# Patient Record
Sex: Female | Born: 1940 | Race: White | Hispanic: No | Marital: Married | Smoking: Former smoker
Health system: Southern US, Community
[De-identification: ages and names within clinical notes are randomized; demographics above are authoritative.]

## PROBLEM LIST (undated history)

## (undated) DIAGNOSIS — E119 Type 2 diabetes mellitus without complications: Secondary | ICD-10-CM

## (undated) DIAGNOSIS — E785 Hyperlipidemia, unspecified: Secondary | ICD-10-CM

## (undated) DIAGNOSIS — Z973 Presence of spectacles and contact lenses: Secondary | ICD-10-CM

## (undated) DIAGNOSIS — L409 Psoriasis, unspecified: Secondary | ICD-10-CM

## (undated) DIAGNOSIS — M1990 Unspecified osteoarthritis, unspecified site: Secondary | ICD-10-CM

## (undated) DIAGNOSIS — I10 Essential (primary) hypertension: Secondary | ICD-10-CM

## (undated) DIAGNOSIS — D869 Sarcoidosis, unspecified: Secondary | ICD-10-CM

## (undated) DIAGNOSIS — C801 Malignant (primary) neoplasm, unspecified: Secondary | ICD-10-CM

## (undated) DIAGNOSIS — G8929 Other chronic pain: Secondary | ICD-10-CM

## (undated) DIAGNOSIS — Z972 Presence of dental prosthetic device (complete) (partial): Secondary | ICD-10-CM

## (undated) DIAGNOSIS — Z8679 Personal history of other diseases of the circulatory system: Secondary | ICD-10-CM

## (undated) DIAGNOSIS — M549 Dorsalgia, unspecified: Secondary | ICD-10-CM

## (undated) HISTORY — DX: Sarcoidosis, unspecified: D86.9

## (undated) HISTORY — PX: ABDOMINAL HYSTERECTOMY: SHX81

## (undated) HISTORY — PX: LIPOMA EXCISION: SHX5283

## (undated) HISTORY — PX: CATARACT EXTRACTION, BILATERAL: SHX1313

## (undated) HISTORY — PX: APPENDECTOMY: SHX54

## (undated) HISTORY — PX: COLONOSCOPY: SHX174

---

## 1996-09-24 DIAGNOSIS — C801 Malignant (primary) neoplasm, unspecified: Secondary | ICD-10-CM

## 1996-09-24 HISTORY — DX: Malignant (primary) neoplasm, unspecified: C80.1

## 1996-09-24 HISTORY — PX: PARTIAL COLECTOMY: SHX5273

## 1998-09-09 ENCOUNTER — Ambulatory Visit (HOSPITAL_COMMUNITY): Admission: RE | Admit: 1998-09-09 | Discharge: 1998-09-09 | Payer: Self-pay | Admitting: Obstetrics and Gynecology

## 1998-09-09 ENCOUNTER — Encounter: Payer: Self-pay | Admitting: Hematology and Oncology

## 1998-12-27 ENCOUNTER — Emergency Department (HOSPITAL_COMMUNITY): Admission: EM | Admit: 1998-12-27 | Discharge: 1998-12-27 | Payer: Self-pay | Admitting: Internal Medicine

## 1998-12-27 ENCOUNTER — Encounter: Payer: Self-pay | Admitting: Internal Medicine

## 1999-02-27 ENCOUNTER — Encounter: Payer: Self-pay | Admitting: Emergency Medicine

## 1999-02-28 ENCOUNTER — Encounter: Payer: Self-pay | Admitting: Emergency Medicine

## 1999-02-28 ENCOUNTER — Encounter: Payer: Self-pay | Admitting: Family Medicine

## 1999-02-28 ENCOUNTER — Inpatient Hospital Stay (HOSPITAL_COMMUNITY): Admission: EM | Admit: 1999-02-28 | Discharge: 1999-03-18 | Payer: Self-pay | Admitting: Emergency Medicine

## 1999-03-01 ENCOUNTER — Encounter: Payer: Self-pay | Admitting: Family Medicine

## 1999-03-08 ENCOUNTER — Encounter: Payer: Self-pay | Admitting: Surgery

## 1999-03-09 ENCOUNTER — Encounter (INDEPENDENT_AMBULATORY_CARE_PROVIDER_SITE_OTHER): Payer: Self-pay | Admitting: Specialist

## 1999-09-29 ENCOUNTER — Ambulatory Visit (HOSPITAL_COMMUNITY): Admission: RE | Admit: 1999-09-29 | Discharge: 1999-09-29 | Payer: Self-pay | Admitting: Hematology and Oncology

## 1999-09-29 ENCOUNTER — Encounter: Payer: Self-pay | Admitting: Hematology and Oncology

## 2000-05-02 ENCOUNTER — Encounter: Admission: RE | Admit: 2000-05-02 | Discharge: 2000-05-02 | Payer: Self-pay | Admitting: Family Medicine

## 2000-05-02 ENCOUNTER — Encounter: Payer: Self-pay | Admitting: Family Medicine

## 2000-09-09 ENCOUNTER — Encounter: Admission: RE | Admit: 2000-09-09 | Discharge: 2000-09-09 | Payer: Self-pay | Admitting: *Deleted

## 2000-09-09 ENCOUNTER — Encounter: Payer: Self-pay | Admitting: *Deleted

## 2000-09-20 ENCOUNTER — Ambulatory Visit (HOSPITAL_COMMUNITY): Admission: RE | Admit: 2000-09-20 | Discharge: 2000-09-20 | Payer: Self-pay | Admitting: Gastroenterology

## 2000-09-20 ENCOUNTER — Encounter (INDEPENDENT_AMBULATORY_CARE_PROVIDER_SITE_OTHER): Payer: Self-pay | Admitting: Specialist

## 2000-09-24 HISTORY — PX: COMBINED MEDIASTINOSCOPY AND BRONCHOSCOPY: SUR303

## 2000-10-01 ENCOUNTER — Ambulatory Visit (HOSPITAL_COMMUNITY): Admission: RE | Admit: 2000-10-01 | Discharge: 2000-10-01 | Payer: Self-pay | Admitting: Family Medicine

## 2000-10-01 ENCOUNTER — Encounter: Payer: Self-pay | Admitting: Family Medicine

## 2000-12-11 ENCOUNTER — Encounter: Admission: RE | Admit: 2000-12-11 | Discharge: 2000-12-11 | Payer: Self-pay | Admitting: *Deleted

## 2000-12-11 ENCOUNTER — Encounter: Payer: Self-pay | Admitting: *Deleted

## 2001-02-21 ENCOUNTER — Encounter: Payer: Self-pay | Admitting: Thoracic Surgery

## 2001-02-24 ENCOUNTER — Encounter (INDEPENDENT_AMBULATORY_CARE_PROVIDER_SITE_OTHER): Payer: Self-pay | Admitting: *Deleted

## 2001-02-24 ENCOUNTER — Ambulatory Visit (HOSPITAL_COMMUNITY): Admission: RE | Admit: 2001-02-24 | Discharge: 2001-02-24 | Payer: Self-pay | Admitting: Thoracic Surgery

## 2001-02-24 ENCOUNTER — Encounter: Payer: Self-pay | Admitting: Thoracic Surgery

## 2001-02-24 ENCOUNTER — Encounter (INDEPENDENT_AMBULATORY_CARE_PROVIDER_SITE_OTHER): Payer: Self-pay | Admitting: Specialist

## 2001-03-14 ENCOUNTER — Ambulatory Visit: Admission: RE | Admit: 2001-03-14 | Discharge: 2001-03-14 | Payer: Self-pay | Admitting: Critical Care Medicine

## 2001-04-23 ENCOUNTER — Encounter: Admission: RE | Admit: 2001-04-23 | Discharge: 2001-04-23 | Payer: Self-pay | Admitting: Thoracic Surgery

## 2001-04-23 ENCOUNTER — Encounter: Payer: Self-pay | Admitting: Thoracic Surgery

## 2001-10-02 ENCOUNTER — Encounter: Payer: Self-pay | Admitting: Family Medicine

## 2001-10-02 ENCOUNTER — Ambulatory Visit (HOSPITAL_COMMUNITY): Admission: RE | Admit: 2001-10-02 | Discharge: 2001-10-02 | Payer: Self-pay | Admitting: Family Medicine

## 2001-12-25 ENCOUNTER — Encounter: Payer: Self-pay | Admitting: *Deleted

## 2001-12-25 ENCOUNTER — Ambulatory Visit (HOSPITAL_COMMUNITY): Admission: RE | Admit: 2001-12-25 | Discharge: 2001-12-25 | Payer: Self-pay | Admitting: *Deleted

## 2002-12-25 ENCOUNTER — Encounter: Payer: Self-pay | Admitting: Family Medicine

## 2002-12-25 ENCOUNTER — Ambulatory Visit (HOSPITAL_COMMUNITY): Admission: RE | Admit: 2002-12-25 | Discharge: 2002-12-25 | Payer: Self-pay | Admitting: Family Medicine

## 2004-03-14 ENCOUNTER — Ambulatory Visit (HOSPITAL_COMMUNITY): Admission: RE | Admit: 2004-03-14 | Discharge: 2004-03-14 | Payer: Self-pay | Admitting: Family Medicine

## 2005-04-10 ENCOUNTER — Ambulatory Visit (HOSPITAL_COMMUNITY): Admission: RE | Admit: 2005-04-10 | Discharge: 2005-04-10 | Payer: Self-pay | Admitting: Family Medicine

## 2006-06-27 ENCOUNTER — Ambulatory Visit (HOSPITAL_COMMUNITY): Admission: RE | Admit: 2006-06-27 | Discharge: 2006-06-27 | Payer: Self-pay | Admitting: Family Medicine

## 2007-07-10 ENCOUNTER — Ambulatory Visit (HOSPITAL_COMMUNITY): Admission: RE | Admit: 2007-07-10 | Discharge: 2007-07-10 | Payer: Self-pay | Admitting: Family Medicine

## 2008-09-06 ENCOUNTER — Ambulatory Visit (HOSPITAL_COMMUNITY): Admission: RE | Admit: 2008-09-06 | Discharge: 2008-09-06 | Payer: Self-pay | Admitting: Family Medicine

## 2009-09-24 HISTORY — PX: DG ARTHRO THUMB*R*: HXRAD209

## 2009-11-07 ENCOUNTER — Ambulatory Visit (HOSPITAL_COMMUNITY): Admission: RE | Admit: 2009-11-07 | Discharge: 2009-11-07 | Payer: Self-pay | Admitting: Family Medicine

## 2010-07-27 ENCOUNTER — Ambulatory Visit (HOSPITAL_BASED_OUTPATIENT_CLINIC_OR_DEPARTMENT_OTHER): Admission: RE | Admit: 2010-07-27 | Discharge: 2010-07-27 | Payer: Self-pay | Admitting: Orthopedic Surgery

## 2010-12-05 LAB — WOUND CULTURE

## 2010-12-05 LAB — GLUCOSE, CAPILLARY
Glucose-Capillary: 145 mg/dL — ABNORMAL HIGH (ref 70–99)
Glucose-Capillary: 168 mg/dL — ABNORMAL HIGH (ref 70–99)

## 2010-12-06 LAB — BASIC METABOLIC PANEL
Creatinine, Ser: 0.79 mg/dL (ref 0.4–1.2)
GFR calc non Af Amer: >60 mL/min (ref >60)
Glucose, Bld: 79 mg/dL (ref 70–99)
Potassium: 5.2 mEq/L — ABNORMAL HIGH (ref 3.5–5.1)

## 2011-02-09 NOTE — Op Note (Signed)
[facility]  Patient:    Beth [name], Beth [name]                      MRN: [id number] Proc. Date: 02/24/01 Adm. Date:  [id number] Attending:  Cameron Proud CC:         Norton Blizzard, M.D. (2)  Oletta Darter, M.D.   Operative Report  PREOPERATIVE DIAGNOSES:  History of colon cancer and mediastinal adenopathy, and small lung nodules.  POSTOPERATIVE DIAGNOSES:  History of colon cancer and mediastinal adenopathy, and small lung nodules.  OPERATION:  Fiberoptic bronchoscopy, mediastinoscopy.  SURGEON:  D. Karle Plumber, M.D.  ANESTHESIA:  General.  DESCRIPTION OF PROCEDURE:  After the percutaneous insertion of all monitoring lines, the patient underwent general anesthesia.  The patient was prepped and draped in the usual sterile manner.  A fiberoptic bronchoscope was passed through the endotracheal tube.  The right upper lobe, right middle lobe, and right lower lobe orifices were normal.  The left upper lobe and left lower lobe orifices were normal.  The anterior neck was prepped and draped in the usual sterile manner.  A transverse incision was made and carried down with electrocautery through the subcutaneous tissue and fascia.  The pretracheal fascia was entered, and biopsies of the 4L and 7 nodes were done.  The mediastinoscope was removed.  The strap muscles were closed with #2-0 Vicryl. The subcutaneous tissue closed with #3-0 Vicryl.  Steri-Strips were applied.  The patient was returned to the recovery room in stable condition.DD:  02/24/01 TD:  02/24/01 Job: [id number]

## 2011-02-09 NOTE — Procedures (Signed)
[facility]  Patient:    ZIARE, ORRICK                        MRN: [id number] Proc. Date: 07/21/00 Attending:  Fayrene Fearing L. Randa Evens, M.D. CC:         Aura Dials, M.D.  Charles A. Tenny Craw, M.D.  Currie Paris, M.D.  [facility]   Procedure Report  PROCEDURE:  Colonoscopy and biopsy.  SURGEON:  James L. Edwards, M.D.  MEDICATIONS:  Fentanyl 100 mcg and Versed 6 mg IV.  INDICATIONS:  Followup of colon cancer.  Done as a two year followup.  She has received 5-FU and leucovorin.  DESCRIPTION OF PROCEDURE:  The procedure had been explained to the patient and consent obtained.  With the patient in the left lateral decubitus position, the pediatric video colonoscope was inserted and advanced under direct visualization.  Prep excellent and were able to advance to the cecum and the ileocecal valve seen.  ________ of what appeared to be the appendix was somewhat mounded up prominent area that could have been a polyp.  I probed it and was never actually sure if the appendiceal orifice was there or if this was truly a polyp.  I therefore elected to biopsy it.  The scope was withdrawn.  The ascending colon, hepatic flexure, transverse colon, splenic flexure, descending and sigmoid colon revealed scattered diverticuli, and otherwise was unremarkable and no further polyps seen.  The scope was withdrawn and the patient tolerated the procedure well.  ASSESSMENT:  Possible cecal polyp versus area from previous appendectomy.  The patients husband thinks that she did have an appendectomy at the time of surgery, but was not absolutely sure.  PLAN:  Will check pathology and make further recommendations then. DD:  09/20/00 TD:  09/21/00 Job: [id number]

## 2011-08-29 ENCOUNTER — Other Ambulatory Visit (HOSPITAL_COMMUNITY): Payer: Self-pay | Admitting: Family Medicine

## 2011-08-29 DIAGNOSIS — Z1231 Encounter for screening mammogram for malignant neoplasm of breast: Secondary | ICD-10-CM

## 2011-10-01 DIAGNOSIS — E119 Type 2 diabetes mellitus without complications: Secondary | ICD-10-CM | POA: Diagnosis not present

## 2011-10-01 DIAGNOSIS — R059 Cough, unspecified: Secondary | ICD-10-CM | POA: Diagnosis not present

## 2011-10-01 DIAGNOSIS — E782 Mixed hyperlipidemia: Secondary | ICD-10-CM | POA: Diagnosis not present

## 2011-10-01 DIAGNOSIS — R05 Cough: Secondary | ICD-10-CM | POA: Diagnosis not present

## 2011-10-01 DIAGNOSIS — I10 Essential (primary) hypertension: Secondary | ICD-10-CM | POA: Diagnosis not present

## 2011-10-03 ENCOUNTER — Ambulatory Visit (HOSPITAL_COMMUNITY)
Admission: RE | Admit: 2011-10-03 | Discharge: 2011-10-03 | Disposition: A | Discharge status: Home / Self Care | Payer: Medicare Other | Source: Ambulatory Visit | Attending: Family Medicine | Admitting: Family Medicine

## 2011-10-03 DIAGNOSIS — Z1231 Encounter for screening mammogram for malignant neoplasm of breast: Secondary | ICD-10-CM

## 2012-08-28 DIAGNOSIS — E119 Type 2 diabetes mellitus without complications: Secondary | ICD-10-CM | POA: Diagnosis not present

## 2012-08-29 ENCOUNTER — Other Ambulatory Visit (HOSPITAL_COMMUNITY): Payer: Self-pay | Admitting: Family Medicine

## 2012-08-29 DIAGNOSIS — Z1231 Encounter for screening mammogram for malignant neoplasm of breast: Secondary | ICD-10-CM

## 2012-09-05 DIAGNOSIS — E119 Type 2 diabetes mellitus without complications: Secondary | ICD-10-CM | POA: Diagnosis not present

## 2012-09-05 DIAGNOSIS — E782 Mixed hyperlipidemia: Secondary | ICD-10-CM | POA: Diagnosis not present

## 2012-09-05 DIAGNOSIS — Z1331 Encounter for screening for depression: Secondary | ICD-10-CM | POA: Diagnosis not present

## 2012-09-05 DIAGNOSIS — L509 Urticaria, unspecified: Secondary | ICD-10-CM | POA: Diagnosis not present

## 2012-09-05 DIAGNOSIS — I10 Essential (primary) hypertension: Secondary | ICD-10-CM | POA: Diagnosis not present

## 2012-09-08 DIAGNOSIS — Z09 Encounter for follow-up examination after completed treatment for conditions other than malignant neoplasm: Secondary | ICD-10-CM | POA: Diagnosis not present

## 2012-09-08 DIAGNOSIS — Z85038 Personal history of other malignant neoplasm of large intestine: Secondary | ICD-10-CM | POA: Diagnosis not present

## 2012-09-08 DIAGNOSIS — K648 Other hemorrhoids: Secondary | ICD-10-CM | POA: Diagnosis not present

## 2012-09-10 DIAGNOSIS — H26499 Other secondary cataract, unspecified eye: Secondary | ICD-10-CM | POA: Diagnosis not present

## 2012-09-22 DIAGNOSIS — Z23 Encounter for immunization: Secondary | ICD-10-CM | POA: Diagnosis not present

## 2012-10-03 ENCOUNTER — Ambulatory Visit (HOSPITAL_COMMUNITY)
Admission: RE | Admit: 2012-10-03 | Discharge: 2012-10-03 | Disposition: A | Discharge status: Home / Self Care | Payer: Medicare Other | Source: Ambulatory Visit | Attending: Family Medicine | Admitting: Family Medicine

## 2012-10-03 DIAGNOSIS — Z1231 Encounter for screening mammogram for malignant neoplasm of breast: Secondary | ICD-10-CM | POA: Insufficient documentation

## 2013-02-06 DIAGNOSIS — L570 Actinic keratosis: Secondary | ICD-10-CM | POA: Diagnosis not present

## 2013-05-22 DIAGNOSIS — S6980XA Other specified injuries of unspecified wrist, hand and finger(s), initial encounter: Secondary | ICD-10-CM | POA: Diagnosis not present

## 2013-05-22 DIAGNOSIS — S6990XA Unspecified injury of unspecified wrist, hand and finger(s), initial encounter: Secondary | ICD-10-CM | POA: Diagnosis not present

## 2013-06-04 DIAGNOSIS — L608 Other nail disorders: Secondary | ICD-10-CM | POA: Diagnosis not present

## 2013-07-09 DIAGNOSIS — Z23 Encounter for immunization: Secondary | ICD-10-CM | POA: Diagnosis not present

## 2013-07-17 DIAGNOSIS — L570 Actinic keratosis: Secondary | ICD-10-CM | POA: Diagnosis not present

## 2013-07-17 DIAGNOSIS — L608 Other nail disorders: Secondary | ICD-10-CM | POA: Diagnosis not present

## 2013-07-20 DIAGNOSIS — S6990XA Unspecified injury of unspecified wrist, hand and finger(s), initial encounter: Secondary | ICD-10-CM | POA: Diagnosis not present

## 2013-07-20 DIAGNOSIS — S6980XA Other specified injuries of unspecified wrist, hand and finger(s), initial encounter: Secondary | ICD-10-CM | POA: Diagnosis not present

## 2013-07-21 ENCOUNTER — Other Ambulatory Visit: Payer: Self-pay | Admitting: Orthopedic Surgery

## 2013-07-21 ENCOUNTER — Encounter (HOSPITAL_BASED_OUTPATIENT_CLINIC_OR_DEPARTMENT_OTHER): Payer: Self-pay | Admitting: *Deleted

## 2013-07-21 ENCOUNTER — Encounter (HOSPITAL_BASED_OUTPATIENT_CLINIC_OR_DEPARTMENT_OTHER)
Admission: RE | Admit: 2013-07-21 | Discharge: 2013-07-21 | Disposition: A | Discharge status: Home / Self Care | Payer: Medicare Other | Source: Ambulatory Visit | Attending: Orthopedic Surgery | Admitting: Orthopedic Surgery

## 2013-07-21 DIAGNOSIS — B351 Tinea unguium: Secondary | ICD-10-CM | POA: Diagnosis not present

## 2013-07-21 DIAGNOSIS — Z87891 Personal history of nicotine dependence: Secondary | ICD-10-CM | POA: Diagnosis not present

## 2013-07-21 DIAGNOSIS — E785 Hyperlipidemia, unspecified: Secondary | ICD-10-CM | POA: Diagnosis not present

## 2013-07-21 DIAGNOSIS — Z79899 Other long term (current) drug therapy: Secondary | ICD-10-CM | POA: Diagnosis not present

## 2013-07-21 DIAGNOSIS — Z85038 Personal history of other malignant neoplasm of large intestine: Secondary | ICD-10-CM | POA: Diagnosis not present

## 2013-07-21 DIAGNOSIS — G8929 Other chronic pain: Secondary | ICD-10-CM | POA: Diagnosis not present

## 2013-07-21 DIAGNOSIS — L608 Other nail disorders: Secondary | ICD-10-CM | POA: Diagnosis not present

## 2013-07-21 DIAGNOSIS — E119 Type 2 diabetes mellitus without complications: Secondary | ICD-10-CM | POA: Diagnosis not present

## 2013-07-21 DIAGNOSIS — M6740 Ganglion, unspecified site: Secondary | ICD-10-CM | POA: Diagnosis not present

## 2013-07-21 DIAGNOSIS — M549 Dorsalgia, unspecified: Secondary | ICD-10-CM | POA: Diagnosis not present

## 2013-07-21 DIAGNOSIS — I10 Essential (primary) hypertension: Secondary | ICD-10-CM | POA: Diagnosis not present

## 2013-07-21 DIAGNOSIS — M129 Arthropathy, unspecified: Secondary | ICD-10-CM | POA: Diagnosis not present

## 2013-07-21 LAB — BASIC METABOLIC PANEL
Calcium: 9.7 mg/dL (ref 8.4–10.5)
GFR calc Af Amer: 78 mL/min — ABNORMAL LOW (ref >90)
GFR calc non Af Amer: 67 mL/min — ABNORMAL LOW (ref >90)
Glucose, Bld: 136 mg/dL — ABNORMAL HIGH (ref 70–99)
Potassium: 4.9 mEq/L (ref 3.5–5.1)
Sodium: 139 mEq/L (ref 135–145)

## 2013-07-21 NOTE — Progress Notes (Signed)
Pt to come in for bmet-ekg- She had surgery here 2011

## 2013-07-22 NOTE — H&P (Signed)
Beth [name] is an 72 y.o. female.   Chief Complaint: c/o dystrophic nail plate right long finger and mucoid cyst right index finger HPI: Beth [name] returns for follow up evaluation of her right long finger nail. She has tried the vigorous soaking regimen without complete success. Prior nail biopsies have shown fungal hyphae and other signs of chronic onychomycosis. She has been so pleased with the results of her nail ablation of the right thumb that she requests that we ablate her right long finger nail and place a full thickness skin graft.  A second problem she brings to my attention today is a mucoid cyst on the dorsal ulnar aspect of her right index finger DIP joint.   Past Medical History  Diagnosis Date  . Wears glasses   . Wears partial dentures     top and bottom partials  . Chronic back pain   . Arthritis     neck and all over  . Hypertension   . Diabetes mellitus without complication   . Cancer 1998    hx colon ca-chemo radiation  . Hyperlipemia     Past Surgical History  Procedure Laterality Date  . Appendectomy    . Abdominal hysterectomy    . Partial colectomy  1998    cancer-rad/chemo  . Combined mediastinoscopy and bronchoscopy  2002    biopsy  . Colonoscopy      many  . Lipoma excision    . Dg arthro thumb*r*  2011    ablasion rt thumb nailbed    History reviewed. No pertinent family history. Social History:  reports that she quit smoking about 42 years ago. She does not have any smokeless tobacco history on file. She reports that she drinks alcohol. She reports that she does not use illicit drugs.  Allergies: No Known Allergies  No prescriptions prior to admission    Results for orders placed during the hospital encounter of 07/23/13 (from the past 48 hour(s))  BASIC METABOLIC PANEL     Status: Abnormal   Collection Time    07/21/13  2:35 PM      Result Value Range   Sodium 139  135 - 145 mEq/L   Potassium 4.9  3.5 - 5.1 mEq/L   Chloride  104  96 - 112 mEq/L   CO2 24  19 - 32 mEq/L   Glucose, Bld 136 (*) 70 - 99 mg/dL   BUN 19  6 - 23 mg/dL   Creatinine, Ser 1.61  0.50 - 1.10 mg/dL   Calcium 9.7  8.4 - 09.6 mg/dL   GFR calc non Af Amer 67 (*) >90 mL/min   GFR calc Af Amer 78 (*) >90 mL/min   Comment: (NOTE)     The eGFR has been calculated using the CKD EPI equation.     This calculation has not been validated in all clinical situations.     eGFR's persistently <90 mL/min signify possible Chronic Kidney     Disease.    No results found.   Pertinent items are noted in HPI.  Height 5\' 2"  (1.575 m), weight 74.844 kg (165 lb).  General appearance: alert Head: Normocephalic, without obvious abnormality Neck: supple, symmetrical, trachea midline Resp: clear to auscultation bilaterally Cardio: regular rate and rhythm GI: normal findings: bowel sounds normal Extremities:.  Inspection of her hands reveals irregularity of her ventral nail matrix and nail interface of all fingers with yellowish discoloration and findings I typically associated with psoriatic involvement of the nails.  She has significant lysis of her right long fingernail and her nail plate is peeling away from the ventral nail matrix on its ulnar aspect. This has the appearance of chronic onychomycosis superimposed on psoriasis.   Inspection of her right index finger DIP joint reveals that she has background osteoarthritis and 10 degrees radial deviation of the DIP joint. She has a soft mucoid cyst. There is no nail deformity in the index finger.   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  Plan:To the OR for ablation nail plate with FTSG to right long finger and excision mucoid cyst with DIP joint debridement right index finger.The procedure, risks,benefits and post-op course were discussed with the  patient at length and they were in agreement with the plan.  DASNOIT,Jonathan Corpus J 07/22/2013, 3:18 PM  H&P documentation: 07/23/2013  -History and Physical Reviewed  -Patient has been re-examined  -No change in the plan of care  Wyn Forster, MD

## 2013-07-23 ENCOUNTER — Ambulatory Visit (HOSPITAL_BASED_OUTPATIENT_CLINIC_OR_DEPARTMENT_OTHER)
Admission: RE | Admit: 2013-07-23 | Discharge: 2013-07-23 | Disposition: A | Discharge status: Home / Self Care | Payer: Medicare Other | Source: Ambulatory Visit | Attending: Orthopedic Surgery | Admitting: Orthopedic Surgery

## 2013-07-23 ENCOUNTER — Encounter (HOSPITAL_BASED_OUTPATIENT_CLINIC_OR_DEPARTMENT_OTHER): Payer: Self-pay | Admitting: *Deleted

## 2013-07-23 ENCOUNTER — Encounter (HOSPITAL_BASED_OUTPATIENT_CLINIC_OR_DEPARTMENT_OTHER): Payer: Medicare Other | Admitting: Anesthesiology

## 2013-07-23 ENCOUNTER — Ambulatory Visit (HOSPITAL_BASED_OUTPATIENT_CLINIC_OR_DEPARTMENT_OTHER): Payer: Medicare Other | Admitting: Anesthesiology

## 2013-07-23 ENCOUNTER — Encounter (HOSPITAL_BASED_OUTPATIENT_CLINIC_OR_DEPARTMENT_OTHER)
Admission: RE | Disposition: A | Discharge status: Home / Self Care | Payer: Self-pay | Source: Ambulatory Visit | Attending: Orthopedic Surgery

## 2013-07-23 DIAGNOSIS — Z85038 Personal history of other malignant neoplasm of large intestine: Secondary | ICD-10-CM | POA: Insufficient documentation

## 2013-07-23 DIAGNOSIS — M7130 Other bursal cyst, unspecified site: Secondary | ICD-10-CM | POA: Diagnosis not present

## 2013-07-23 DIAGNOSIS — G8929 Other chronic pain: Secondary | ICD-10-CM | POA: Diagnosis not present

## 2013-07-23 DIAGNOSIS — L608 Other nail disorders: Secondary | ICD-10-CM | POA: Insufficient documentation

## 2013-07-23 DIAGNOSIS — M129 Arthropathy, unspecified: Secondary | ICD-10-CM | POA: Insufficient documentation

## 2013-07-23 DIAGNOSIS — M6740 Ganglion, unspecified site: Secondary | ICD-10-CM | POA: Diagnosis not present

## 2013-07-23 DIAGNOSIS — M549 Dorsalgia, unspecified: Secondary | ICD-10-CM | POA: Insufficient documentation

## 2013-07-23 DIAGNOSIS — S6980XA Other specified injuries of unspecified wrist, hand and finger(s), initial encounter: Secondary | ICD-10-CM | POA: Diagnosis not present

## 2013-07-23 DIAGNOSIS — E119 Type 2 diabetes mellitus without complications: Secondary | ICD-10-CM | POA: Insufficient documentation

## 2013-07-23 DIAGNOSIS — S6990XA Unspecified injury of unspecified wrist, hand and finger(s), initial encounter: Secondary | ICD-10-CM | POA: Diagnosis not present

## 2013-07-23 DIAGNOSIS — B351 Tinea unguium: Secondary | ICD-10-CM | POA: Diagnosis not present

## 2013-07-23 DIAGNOSIS — M24149 Other articular cartilage disorders, unspecified hand: Secondary | ICD-10-CM | POA: Diagnosis not present

## 2013-07-23 DIAGNOSIS — I10 Essential (primary) hypertension: Secondary | ICD-10-CM | POA: Insufficient documentation

## 2013-07-23 DIAGNOSIS — E785 Hyperlipidemia, unspecified: Secondary | ICD-10-CM | POA: Insufficient documentation

## 2013-07-23 DIAGNOSIS — Z87891 Personal history of nicotine dependence: Secondary | ICD-10-CM | POA: Insufficient documentation

## 2013-07-23 DIAGNOSIS — Z79899 Other long term (current) drug therapy: Secondary | ICD-10-CM | POA: Insufficient documentation

## 2013-07-23 HISTORY — PX: I & D EXTREMITY: SHX5045

## 2013-07-23 HISTORY — DX: Type 2 diabetes mellitus without complications: E11.9

## 2013-07-23 HISTORY — DX: Malignant (primary) neoplasm, unspecified: C80.1

## 2013-07-23 HISTORY — DX: Hyperlipidemia, unspecified: E78.5

## 2013-07-23 HISTORY — DX: Presence of spectacles and contact lenses: Z97.3

## 2013-07-23 HISTORY — DX: Dorsalgia, unspecified: M54.9

## 2013-07-23 HISTORY — DX: Unspecified osteoarthritis, unspecified site: M19.90

## 2013-07-23 HISTORY — DX: Presence of dental prosthetic device (complete) (partial): Z97.2

## 2013-07-23 HISTORY — DX: Other chronic pain: G89.29

## 2013-07-23 HISTORY — DX: Essential (primary) hypertension: I10

## 2013-07-23 HISTORY — PX: SKIN FULL THICKNESS GRAFT: SHX442

## 2013-07-23 LAB — POCT HEMOGLOBIN-HEMACUE: Hemoglobin: 13.4 g/dL (ref 12.0–15.0)

## 2013-07-23 LAB — GLUCOSE, CAPILLARY: Glucose-Capillary: 113 mg/dL — ABNORMAL HIGH (ref 70–99)

## 2013-07-23 SURGERY — APPLICATION, GRAFT, SKIN, FULL-THICKNESS
Anesthesia: Monitor Anesthesia Care | Site: Finger | Laterality: Right | Wound class: Clean

## 2013-07-23 MED ORDER — EPHEDRINE SULFATE 50 MG/ML IJ SOLN
INTRAMUSCULAR | Status: DC | PRN
  Administered 2013-07-23 (×2): 10 mg via INTRAVENOUS

## 2013-07-23 MED ORDER — FENTANYL CITRATE 0.05 MG/ML IJ SOLN: 50.0000 ug | INTRAMUSCULAR | Status: DC | PRN

## 2013-07-23 MED ORDER — LIDOCAINE HCL (CARDIAC) 20 MG/ML IV SOLN
INTRAVENOUS | Status: DC | PRN
  Administered 2013-07-23: 50 mg via INTRAVENOUS

## 2013-07-23 MED ORDER — MIDAZOLAM HCL 2 MG/2ML IJ SOLN: 1.0000 mg | INTRAMUSCULAR | Status: DC | PRN

## 2013-07-23 MED ORDER — FENTANYL CITRATE 0.05 MG/ML IJ SOLN: 25.0000 ug | INTRAMUSCULAR | Status: DC | PRN

## 2013-07-23 MED ORDER — OXYCODONE HCL 5 MG PO TABS
5.0000 mg | ORAL_TABLET | Freq: Once | ORAL | Status: AC | PRN
  Administered 2013-07-23: 5 mg via ORAL

## 2013-07-23 MED ORDER — LACTATED RINGERS IV SOLN
INTRAVENOUS | Status: DC
  Administered 2013-07-23: 08:00:00 via INTRAVENOUS
  Administered 2013-07-23: 20 mL/h via INTRAVENOUS

## 2013-07-23 MED ORDER — LIDOCAINE HCL 2 % IJ SOLN
INTRAMUSCULAR | Status: DC | PRN
  Administered 2013-07-23: 7.5 mL

## 2013-07-23 MED ORDER — PROPOFOL 10 MG/ML IV BOLUS
INTRAVENOUS | Status: DC | PRN
  Administered 2013-07-23: 150 mg via INTRAVENOUS

## 2013-07-23 MED ORDER — OXYCODONE HCL 5 MG/5ML PO SOLN
5.0000 mg | Freq: Once | ORAL | Status: AC | PRN
Start: 2013-07-23 — End: 2013-07-23

## 2013-07-23 MED ORDER — FENTANYL CITRATE 0.05 MG/ML IJ SOLN
INTRAMUSCULAR | Status: AC
  Filled 2013-07-23: qty 2

## 2013-07-23 MED ORDER — ONDANSETRON HCL 4 MG/2ML IJ SOLN
INTRAMUSCULAR | Status: DC | PRN
  Administered 2013-07-23: 4 mg via INTRAVENOUS

## 2013-07-23 MED ORDER — CHLORHEXIDINE GLUCONATE 4 % EX LIQD: 60.0000 mL | Freq: Once | CUTANEOUS | Status: DC

## 2013-07-23 MED ORDER — FENTANYL CITRATE 0.05 MG/ML IJ SOLN
INTRAMUSCULAR | Status: DC | PRN
  Administered 2013-07-23 (×2): 50 ug via INTRAVENOUS

## 2013-07-23 MED ORDER — OXYCODONE HCL 5 MG PO TABS
ORAL_TABLET | ORAL | Status: AC
  Filled 2013-07-23: qty 1

## 2013-07-23 MED ORDER — OXYCODONE-ACETAMINOPHEN 5-325 MG PO TABS: ORAL_TABLET | ORAL | Status: DC

## 2013-07-23 SURGICAL SUPPLY — 75 items
BAG DECANTER FOR FLEXI CONT (MISCELLANEOUS) IMPLANT
BANDAGE ADHESIVE 1X3 (GAUZE/BANDAGES/DRESSINGS) IMPLANT
BANDAGE COBAN STERILE 2 (GAUZE/BANDAGES/DRESSINGS) IMPLANT
BANDAGE ELASTIC 3 VELCRO ST LF (GAUZE/BANDAGES/DRESSINGS) IMPLANT
BANDAGE GAUZE ELAST BULKY 4 IN (GAUZE/BANDAGES/DRESSINGS) IMPLANT
BLADE MINI RND TIP GREEN BEAV (BLADE) IMPLANT
BLADE SURG 15 STRL LF DISP TIS (BLADE) ×1 IMPLANT
BLADE SURG 15 STRL SS (BLADE) ×2
BNDG CMPR 9X4 STRL LF SNTH (GAUZE/BANDAGES/DRESSINGS)
BNDG CMPR MD 5X2 ELC HKLP STRL (GAUZE/BANDAGES/DRESSINGS)
BNDG COHESIVE 1X5 TAN STRL LF (GAUZE/BANDAGES/DRESSINGS) ×1 IMPLANT
BNDG COHESIVE 3X5 TAN STRL LF (GAUZE/BANDAGES/DRESSINGS) IMPLANT
BNDG ELASTIC 2 VLCR STRL LF (GAUZE/BANDAGES/DRESSINGS) IMPLANT
BNDG ESMARK 4X9 LF (GAUZE/BANDAGES/DRESSINGS) IMPLANT
BRUSH SCRUB EZ PLAIN DRY (MISCELLANEOUS) ×2 IMPLANT
CORDS BIPOLAR (ELECTRODE) ×2 IMPLANT
COVER MAYO STAND STRL (DRAPES) ×2 IMPLANT
COVER TABLE BACK 60X90 (DRAPES) ×2 IMPLANT
CUFF TOURNIQUET SINGLE 18IN (TOURNIQUET CUFF) IMPLANT
DECANTER SPIKE VIAL GLASS SM (MISCELLANEOUS) IMPLANT
DRAIN PENROSE 1/2X12 LTX STRL (WOUND CARE) IMPLANT
DRAIN PENROSE 1/4X12 LTX STRL (WOUND CARE) IMPLANT
DRAPE EXTREMITY T 121X128X90 (DRAPE) ×2 IMPLANT
DRAPE SURG 17X23 STRL (DRAPES) ×2 IMPLANT
DRESSING TELFA 8X3 (GAUZE/BANDAGES/DRESSINGS) IMPLANT
DRSG EMULSION OIL 3X3 NADH (GAUZE/BANDAGES/DRESSINGS) IMPLANT
DRSG PAD ABDOMINAL 8X10 ST (GAUZE/BANDAGES/DRESSINGS) IMPLANT
DRSG TEGADERM 4X4.75 (GAUZE/BANDAGES/DRESSINGS) ×1 IMPLANT
GAUZE PACKING IODOFORM 1/4X5 (PACKING) IMPLANT
GAUZE XEROFORM 1X8 LF (GAUZE/BANDAGES/DRESSINGS) ×1 IMPLANT
GLOVE BIOGEL M STRL SZ7.5 (GLOVE) ×2 IMPLANT
GLOVE BIOGEL PI IND STRL 7.0 (GLOVE) IMPLANT
GLOVE BIOGEL PI IND STRL 8 (GLOVE) ×1 IMPLANT
GLOVE BIOGEL PI INDICATOR 7.0 (GLOVE) ×3
GLOVE BIOGEL PI INDICATOR 8 (GLOVE) ×1
GLOVE ORTHO TXT STRL SZ7.5 (GLOVE) ×3 IMPLANT
GOWN BRE IMP PREV XXLGXLNG (GOWN DISPOSABLE) ×4 IMPLANT
GOWN PREVENTION PLUS XLARGE (GOWN DISPOSABLE) ×2 IMPLANT
LOOP VESSEL MINI RED (MISCELLANEOUS) IMPLANT
NDL SAFETY ECLIPSE 18X1.5 (NEEDLE) IMPLANT
NEEDLE 27GAX1X1/2 (NEEDLE) IMPLANT
NEEDLE HYPO 18GX1.5 SHARP (NEEDLE) ×2
NS IRRIG 1000ML POUR BTL (IV SOLUTION) ×2 IMPLANT
PACK BASIN DAY SURGERY FS (CUSTOM PROCEDURE TRAY) ×2 IMPLANT
PAD CAST 3X4 CTTN HI CHSV (CAST SUPPLIES) IMPLANT
PADDING CAST ABS 3INX4YD NS (CAST SUPPLIES)
PADDING CAST ABS 4INX4YD NS (CAST SUPPLIES) ×1
PADDING CAST ABS COTTON 3X4 (CAST SUPPLIES) IMPLANT
PADDING CAST ABS COTTON 4X4 ST (CAST SUPPLIES) ×1 IMPLANT
PADDING CAST COTTON 3X4 STRL (CAST SUPPLIES)
SLEEVE SURGEON STRL (DRAPES) IMPLANT
SPONGE GAUZE 4X4 12PLY (GAUZE/BANDAGES/DRESSINGS) ×2 IMPLANT
STOCKINETTE 4X48 STRL (DRAPES) ×2 IMPLANT
STRIP CLOSURE SKIN 1/2X4 (GAUZE/BANDAGES/DRESSINGS) ×2 IMPLANT
SUT CHROMIC 5 0 P 3 (SUTURE) IMPLANT
SUT CHROMIC 6 0 PS 4 (SUTURE) ×1 IMPLANT
SUT ETHILON 5 0 P 3 18 (SUTURE) ×1
SUT ETHILON 5 0 PS 2 18 (SUTURE) IMPLANT
SUT NYLON ETHILON 5-0 P-3 1X18 (SUTURE) ×1 IMPLANT
SUT PROLENE 3 0 PS 2 (SUTURE) ×2 IMPLANT
SUT PROLENE 4 0 P 3 18 (SUTURE) ×1 IMPLANT
SUT VIC AB 2-0 SH 27 (SUTURE)
SUT VIC AB 2-0 SH 27XBRD (SUTURE) IMPLANT
SUT VIC AB 4-0 P-3 18XBRD (SUTURE) IMPLANT
SUT VIC AB 4-0 P3 18 (SUTURE) ×2
SUT VICRYL 4-0 PS2 18IN ABS (SUTURE) IMPLANT
SWAB COLLECTION DEVICE MRSA (MISCELLANEOUS) IMPLANT
SYR 3ML 23GX1 SAFETY (SYRINGE) IMPLANT
SYR BULB 3OZ (MISCELLANEOUS) ×2 IMPLANT
SYR CONTROL 10ML LL (SYRINGE) IMPLANT
TOWEL OR 17X24 6PK STRL BLUE (TOWEL DISPOSABLE) ×4 IMPLANT
TRAY DSU PREP LF (CUSTOM PROCEDURE TRAY) ×2 IMPLANT
TUBE ANAEROBIC SPECIMEN COL (MISCELLANEOUS) IMPLANT
TUBE FEEDING 5FR 15 INCH (TUBING) IMPLANT
UNDERPAD 30X30 INCONTINENT (UNDERPADS AND DIAPERS) ×2 IMPLANT

## 2013-07-23 NOTE — Anesthesia Preprocedure Evaluation (Addendum)
Anesthesia Evaluation  Patient identified by MRN, date of birth, ID band Patient awake    Reviewed: Allergy & Precautions, H&P , NPO status , Patient's Chart, lab work & pertinent test results  History of Anesthesia Complications Negative for: history of anesthetic complications  Airway Mallampati: I  Neck ROM: Full    Dental   Pulmonary neg pulmonary ROS,  breath sounds clear to auscultation        Cardiovascular hypertension, Rhythm:Regular Rate:Normal     Neuro/Psych    GI/Hepatic   Endo/Other  diabetes  Renal/GU      Musculoskeletal   Abdominal   Peds  Hematology   Anesthesia Other Findings   Reproductive/Obstetrics                          Anesthesia Physical Anesthesia Plan  ASA: II  Anesthesia Plan: MAC and Regional   Post-op Pain Management:    Induction: Intravenous  Airway Management Planned: Natural Airway and Simple Face Mask  Additional Equipment:   Intra-op Plan:   Post-operative Plan:   Informed Consent: I have reviewed the patients History and Physical, chart, labs and discussed the procedure including the risks, benefits and alternatives for the proposed anesthesia with the patient or authorized representative who has indicated his/her understanding and acceptance.   Dental advisory given  Plan Discussed with: CRNA and Surgeon  Anesthesia Plan Comments:        Anesthesia Quick Evaluation

## 2013-07-23 NOTE — Transfer of Care (Signed)
Immediate Anesthesia Transfer of Care Note  Patient: Beth [name]  Procedure(s) Performed: Procedure(s): NAIL PLATE ABLATION WITH FULL THICKNESS SKIN GRAFT FROM RIGHT   MEDIAL ELBOW TO RIGHT LONG FINGER NAIL BED (Right) IRRIGATION AND DEBRIDEMENT DIP JOINT RIGHT INDEX FINGER, EXCISE MUCOID CYST RIGHT INDEX FINGER   (Right)  Patient Location: PACU  Anesthesia Type:General  Level of Consciousness: sedated and patient cooperative  Airway & Oxygen Therapy: Patient Spontanous Breathing and Patient connected to face mask oxygen  Post-op Assessment: Report given to PACU RN and Post -op Vital signs reviewed and stable  Post vital signs: Reviewed and stable  Complications: No apparent anesthesia complications

## 2013-07-23 NOTE — Anesthesia Procedure Notes (Signed)
Procedure Name: LMA Insertion Date/Time: 07/23/2013 7:47 AM Performed by: Gar Gibbon Pre-anesthesia Checklist: Patient identified, Emergency Drugs available, Suction available and Patient being monitored Patient Re-evaluated:Patient Re-evaluated prior to inductionOxygen Delivery Method: Circle System Utilized Preoxygenation: Pre-oxygenation with 100% oxygen Intubation Type: IV induction Ventilation: Mask ventilation without difficulty LMA: LMA inserted LMA Size: 4.0 Number of attempts: 1 Airway Equipment and Method: bite block Placement Confirmation: positive ETCO2 Tube secured with: Tape Dental Injury: Teeth and Oropharynx as per pre-operative assessment

## 2013-07-23 NOTE — Op Note (Signed)
[id number] 

## 2013-07-23 NOTE — Brief Op Note (Signed)
07/23/2013  8:52 AM  PATIENT:  Beth [name]  72 y.o. female  PRE-OPERATIVE DIAGNOSIS:  RIGHT LONG FINGER DYSTROPHIC PAINFUL FINGER NAIL,  MUCOID CYST INDEX FINGER RIGHT   POST-OPERATIVE DIAGNOSIS:  RIGHT LONG FINGER DYSTROPHIC PAINFUL FINGER NAIL,  MUCOID CYST INDEX FINGER RIGHT   PROCEDURE:  Procedure(s): NAIL PLATE ABLATION WITH FULL THICKNESS SKIN GRAFT FROM RIGHT   MEDIAL ELBOW TO RIGHT LONG FINGER NAIL BED (Right) IRRIGATION AND DEBRIDEMENT DIP JOINT RIGHT INDEX FINGER, EXCISE MUCOID CYST RIGHT INDEX FINGER   (Right)  SURGEON:  Surgeon(s) and Role:    * Wyn Forster., MD - Primary  PHYSICIAN ASSISTANT:   ASSISTANTS: surgical technician  ANESTHESIA:   general  EBL:  Total I/O In: 1000 [I.V.:1000] Out: -   BLOOD ADMINISTERED:none  DRAINS: none   LOCAL MEDICATIONS USED:  LIDOCAINE   SPECIMEN:  Biopsy / Limited Resection  DISPOSITION OF SPECIMEN:  PATHOLOGY  COUNTS:  YES  TOURNIQUET:   Total Tourniquet Time Documented: Upper Arm (Right) - 49 minutes Total: Upper Arm (Right) - 49 minutes   DICTATION: .Other Dictation: Dictation Number [id number]  PLAN OF CARE: Discharge to home after PACU  PATIENT DISPOSITION:  PACU - hemodynamically stable.   Delay start of Pharmacological VTE agent (>24hrs) due to surgical blood loss or risk of bleeding: not applicable

## 2013-07-23 NOTE — Anesthesia Postprocedure Evaluation (Signed)
  Anesthesia Post-op Note  Patient: Beth [name]  Procedure(s) Performed: Procedure(s): NAIL PLATE ABLATION WITH FULL THICKNESS SKIN GRAFT FROM RIGHT   MEDIAL ELBOW TO RIGHT LONG FINGER NAIL BED (Right) IRRIGATION AND DEBRIDEMENT DIP JOINT RIGHT INDEX FINGER, EXCISE MUCOID CYST RIGHT INDEX FINGER   (Right)  Patient Location: PACU  Anesthesia Type:General  Level of Consciousness: awake  Airway and Oxygen Therapy: Patient Spontanous Breathing  Post-op Pain: mild  Post-op Assessment: Post-op Vital signs reviewed  Post-op Vital Signs: stable  Complications: No apparent anesthesia complications

## 2013-07-24 ENCOUNTER — Encounter (HOSPITAL_BASED_OUTPATIENT_CLINIC_OR_DEPARTMENT_OTHER): Payer: Self-pay | Admitting: Orthopedic Surgery

## 2013-07-24 NOTE — Op Note (Signed)
NAMEGILBERT, Beth [name]               ACCOUNT NO.:  [id number]  MEDICAL RECORD NO.:  [id number]  LOCATION:                                 FACILITY:  PHYSICIAN:  Katy Fitch. Keala Drum, M.D.      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  OPERATION: 1. Full-thickness nail mechanism biopsy of right long finger, sending     the specimen for routine histopathology as well as PAS stain. 2. Harvest of full-thickness skin graft measuring 2 x 1 cm from medial     right brachium with primary repair of donor site. 3. Full-thickness skin grafting of long finger nail bed with Prolene     and absorbable 6-0 chromic suture followed by application of     pressure dressing. 4. Debridement of right index finger distal interphalangeal joint with     removal of mucoid cyst, marginal osteophytes, and synovectomy of     distal interphalangeal joint.  OPERATING SURGEON:  Katy Fitch. Starlyn Droge, MD.  ASSISTANT:  Surgical technician.  ANESTHESIA:  General by LMA supplemented by right index and right long finger digital blocks and medial brachial block for postoperative comfort with 2% lidocaine.  INDICATIONS:  Beth [name] is a 72 year old woman referred through  the courtesy of Dr. Catha Gosselin and Dr. Venancio Poisson, attending dermatologist for management of a dystrophic right long fingernail and a mucoid cyst of the right index finger.  Several years prior, Beth [name] presented for ablation of a chronically dystrophic right thumb nail.  Despite maximum efforts with the thumb, she had a nail that was very problematic and unsightly.  We elected to ablate the nail and place a full-thickness skin graft with donor skin from the medial right brachium.  Beth [name] had a very satisfactory experience with her thumb and was very pleased with the cosmetic appearance of her thumb.  She returned specifically requesting the same treatment of her right long finger after an exhaustive nonoperative management of the long finger.  Prior biopsy of the right thumb nail mechanism revealed fungal hyphae within the nail despite treatment.  Preoperatively, she was noted to have a inspissated mucoid cyst on the ulnar aspect of her right index finger DIP joint.  We advised that we could perform a DIP joint debridement of cyst excision and synovectomy. Typically after debriding the DIP joint and removal of the cyst, the cyst process will become dormant.  Detailed informed consent was provided by Dr. Jacklynn Bue of anesthesia. General anesthesia by LMA technique was recommended and accepted.  PROCEDURE IN DETAIL:  Beth [name] was brought to room #2 of the [facility] and placed in supine position on the operating table.  Following induction of general anesthesia by LMA technique under Dr. Marlane Mingle direct supervision, the right hand and arm were prepped with Betadine soap and solution, sterilely draped.  A pneumatic tourniquet was applied high in the right brachium.  The arm was subsequently exsanguinated with Esmarch bandage and an arterial tourniquet on proximal brachium inflated at 220 mmHg.  A routine surgical time-out was accomplished followed by  planning of a full-thickness skin graft harvested from the medial distal brachium on the right.  We basically went posterior to the prior biopsy site and resected a segment of skin measuring 2 cm in length by 1 cm in width.  This was harvested full thickness and placed in saline soaked sponge.  The donor site was then repaired with subcutaneous 4-0 Vicryl and intradermal 4-0 Prolene with Steri-Strips.  The wound margins were infiltrated with 2% lidocaine for postoperative comfort.  A Steri-Strips applied followed by sterile gauze and Tegaderm dressing. Attention was then directed to the right long finger.  We planned a very cosmetic resection of the nail mechanism.  A 1.5 mm margin was taken to assure that all nail elements were removed.  The nail was taken en bloc followed by use of rongeur to remove any residual nail elements from the extensor tendon and distal phalanx.  We then trephined the distal phalanx with an 18-gauge needle repeatedly to provide blood supply to the full-thickness skin graft.  The graft was then thinned with scissors to a thick partial-thickness graft removing all fat.  The graft was then inset with corner sutures of 4-0 Prolene and running suture of 6-0 chromic.  A pressure dressing was applied with a Xeroflo pledget followed by Lyda Perone sterile gauze and a Coban finger dressing.  The index finger was then addressed with a curvilinear incision incorporating the mucoid cyst on the ulnar aspect of the DIP joint. Subcutaneous tissues were carefully divided revealing inspissated mucoid cyst.  This was thoroughly debrided with a micro-rongeur followed by arthrotomy of the DIP joint with resection of the capsule between the terminal extensor tendon slip and the ulnar collateral ligament.  A house micro curette was used to debride marginal osteophytes and a micro- rongeur was used to perform synovectomy.  Some cartilaginous debris and synovitis was removed from  the DIP joint.  The wound was then repaired with intradermal 4-0 Prolene.  A finger dressing was applied to the right index finger with sterile Xeroflo gauze and Coban.  A 2% lidocaine was infiltrated in metacarpal head level to obtain a postop digital block for comfort.  There were no apparent complications.  For aftercare, Beth [name] was provided a prescription for Percocet 5 mg 1 p.o. q.4-6 hours p.r.n. pain, #30 tablets without refill.     Katy Fitch Alya Smaltz, M.D.     RVS/MEDQ  D:  07/23/2013  T:  07/24/2013  Job:  [id number]  cc:   Caryn Bee L. Little, M.D. Venancio Poisson, MD

## 2013-10-12 ENCOUNTER — Other Ambulatory Visit (HOSPITAL_COMMUNITY): Payer: Self-pay | Admitting: Family Medicine

## 2013-10-12 DIAGNOSIS — Z1231 Encounter for screening mammogram for malignant neoplasm of breast: Secondary | ICD-10-CM

## 2013-11-23 ENCOUNTER — Ambulatory Visit (HOSPITAL_COMMUNITY)
Admission: RE | Admit: 2013-11-23 | Discharge: 2013-11-23 | Disposition: A | Discharge status: Home / Self Care | Payer: Medicare Other | Source: Ambulatory Visit | Attending: Family Medicine | Admitting: Family Medicine

## 2013-11-23 DIAGNOSIS — Z1231 Encounter for screening mammogram for malignant neoplasm of breast: Secondary | ICD-10-CM | POA: Diagnosis not present

## 2013-11-30 DIAGNOSIS — H40019 Open angle with borderline findings, low risk, unspecified eye: Secondary | ICD-10-CM | POA: Diagnosis not present

## 2013-11-30 DIAGNOSIS — Z961 Presence of intraocular lens: Secondary | ICD-10-CM | POA: Diagnosis not present

## 2013-11-30 DIAGNOSIS — E119 Type 2 diabetes mellitus without complications: Secondary | ICD-10-CM | POA: Diagnosis not present

## 2014-01-13 DIAGNOSIS — R82998 Other abnormal findings in urine: Secondary | ICD-10-CM | POA: Diagnosis not present

## 2014-01-13 DIAGNOSIS — Z23 Encounter for immunization: Secondary | ICD-10-CM | POA: Diagnosis not present

## 2014-01-13 DIAGNOSIS — E119 Type 2 diabetes mellitus without complications: Secondary | ICD-10-CM | POA: Diagnosis not present

## 2014-01-13 DIAGNOSIS — I10 Essential (primary) hypertension: Secondary | ICD-10-CM | POA: Diagnosis not present

## 2014-01-13 DIAGNOSIS — E782 Mixed hyperlipidemia: Secondary | ICD-10-CM | POA: Diagnosis not present

## 2014-01-18 ENCOUNTER — Other Ambulatory Visit: Payer: Self-pay | Admitting: Dermatology

## 2014-01-18 DIAGNOSIS — D0430 Carcinoma in situ of skin of unspecified part of face: Secondary | ICD-10-CM | POA: Diagnosis not present

## 2014-01-18 DIAGNOSIS — D0439 Carcinoma in situ of skin of other parts of face: Secondary | ICD-10-CM | POA: Diagnosis not present

## 2014-01-18 DIAGNOSIS — D485 Neoplasm of uncertain behavior of skin: Secondary | ICD-10-CM | POA: Diagnosis not present

## 2014-01-18 DIAGNOSIS — I789 Disease of capillaries, unspecified: Secondary | ICD-10-CM | POA: Diagnosis not present

## 2014-06-09 DIAGNOSIS — L570 Actinic keratosis: Secondary | ICD-10-CM | POA: Diagnosis not present

## 2014-06-09 DIAGNOSIS — L821 Other seborrheic keratosis: Secondary | ICD-10-CM | POA: Diagnosis not present

## 2014-06-09 DIAGNOSIS — Z85828 Personal history of other malignant neoplasm of skin: Secondary | ICD-10-CM | POA: Diagnosis not present

## 2014-07-03 DIAGNOSIS — Z23 Encounter for immunization: Secondary | ICD-10-CM | POA: Diagnosis not present

## 2014-08-18 DIAGNOSIS — N760 Acute vaginitis: Secondary | ICD-10-CM | POA: Diagnosis not present

## 2014-09-01 DIAGNOSIS — M151 Heberden's nodes (with arthropathy): Secondary | ICD-10-CM | POA: Diagnosis not present

## 2014-09-01 DIAGNOSIS — D2111 Benign neoplasm of connective and other soft tissue of right upper limb, including shoulder: Secondary | ICD-10-CM | POA: Diagnosis not present

## 2014-09-02 ENCOUNTER — Other Ambulatory Visit: Payer: Self-pay | Admitting: Orthopedic Surgery

## 2014-09-22 ENCOUNTER — Encounter (HOSPITAL_BASED_OUTPATIENT_CLINIC_OR_DEPARTMENT_OTHER): Payer: Self-pay | Admitting: *Deleted

## 2014-09-22 NOTE — Progress Notes (Signed)
Will come in for ekg-bmet-has been here for hand surgery in past

## 2014-09-27 ENCOUNTER — Encounter (HOSPITAL_BASED_OUTPATIENT_CLINIC_OR_DEPARTMENT_OTHER)
Admission: RE | Admit: 2014-09-27 | Discharge: 2014-09-27 | Disposition: A | Discharge status: Home / Self Care | Payer: Medicare Other | Source: Ambulatory Visit | Attending: Orthopedic Surgery | Admitting: Orthopedic Surgery

## 2014-09-27 DIAGNOSIS — R2231 Localized swelling, mass and lump, right upper limb: Secondary | ICD-10-CM | POA: Diagnosis present

## 2014-09-27 DIAGNOSIS — E785 Hyperlipidemia, unspecified: Secondary | ICD-10-CM | POA: Diagnosis not present

## 2014-09-27 DIAGNOSIS — M19049 Primary osteoarthritis, unspecified hand: Secondary | ICD-10-CM | POA: Diagnosis not present

## 2014-09-27 DIAGNOSIS — Z85038 Personal history of other malignant neoplasm of large intestine: Secondary | ICD-10-CM | POA: Diagnosis not present

## 2014-09-27 DIAGNOSIS — G8929 Other chronic pain: Secondary | ICD-10-CM | POA: Diagnosis not present

## 2014-09-27 DIAGNOSIS — L720 Epidermal cyst: Secondary | ICD-10-CM | POA: Diagnosis not present

## 2014-09-27 DIAGNOSIS — Z87891 Personal history of nicotine dependence: Secondary | ICD-10-CM | POA: Diagnosis not present

## 2014-09-27 DIAGNOSIS — I10 Essential (primary) hypertension: Secondary | ICD-10-CM | POA: Diagnosis not present

## 2014-09-27 DIAGNOSIS — E119 Type 2 diabetes mellitus without complications: Secondary | ICD-10-CM | POA: Diagnosis not present

## 2014-09-27 DIAGNOSIS — Z923 Personal history of irradiation: Secondary | ICD-10-CM | POA: Diagnosis not present

## 2014-09-27 LAB — BASIC METABOLIC PANEL
Anion gap: 8 (ref 5–15)
BUN: 25 mg/dL — AB (ref 6–23)
CHLORIDE: 106 meq/L (ref 96–112)
CO2: 23 mmol/L (ref 19–32)
CREATININE: 0.92 mg/dL (ref 0.50–1.10)
Calcium: 9.9 mg/dL (ref 8.4–10.5)
GFR calc Af Amer: 70 mL/min — ABNORMAL LOW (ref >90)
GFR calc non Af Amer: 60 mL/min — ABNORMAL LOW (ref >90)
Glucose, Bld: 123 mg/dL — ABNORMAL HIGH (ref 70–99)
POTASSIUM: 4.5 mmol/L (ref 3.5–5.1)
Sodium: 137 mmol/L (ref 135–145)

## 2014-09-28 ENCOUNTER — Encounter (HOSPITAL_BASED_OUTPATIENT_CLINIC_OR_DEPARTMENT_OTHER)
Admission: RE | Disposition: A | Discharge status: Home / Self Care | Payer: Self-pay | Source: Ambulatory Visit | Attending: Orthopedic Surgery

## 2014-09-28 ENCOUNTER — Ambulatory Visit (HOSPITAL_BASED_OUTPATIENT_CLINIC_OR_DEPARTMENT_OTHER)
Admission: RE | Admit: 2014-09-28 | Discharge: 2014-09-28 | Disposition: A | Discharge status: Home / Self Care | Payer: Medicare Other | Source: Ambulatory Visit | Attending: Orthopedic Surgery | Admitting: Orthopedic Surgery

## 2014-09-28 ENCOUNTER — Ambulatory Visit (HOSPITAL_BASED_OUTPATIENT_CLINIC_OR_DEPARTMENT_OTHER): Payer: Medicare Other | Admitting: Anesthesiology

## 2014-09-28 ENCOUNTER — Encounter (HOSPITAL_BASED_OUTPATIENT_CLINIC_OR_DEPARTMENT_OTHER): Payer: Self-pay | Admitting: *Deleted

## 2014-09-28 DIAGNOSIS — E119 Type 2 diabetes mellitus without complications: Secondary | ICD-10-CM | POA: Diagnosis not present

## 2014-09-28 DIAGNOSIS — M19049 Primary osteoarthritis, unspecified hand: Secondary | ICD-10-CM | POA: Insufficient documentation

## 2014-09-28 DIAGNOSIS — G8929 Other chronic pain: Secondary | ICD-10-CM | POA: Insufficient documentation

## 2014-09-28 DIAGNOSIS — I10 Essential (primary) hypertension: Secondary | ICD-10-CM | POA: Diagnosis not present

## 2014-09-28 DIAGNOSIS — E785 Hyperlipidemia, unspecified: Secondary | ICD-10-CM | POA: Insufficient documentation

## 2014-09-28 DIAGNOSIS — Z87891 Personal history of nicotine dependence: Secondary | ICD-10-CM | POA: Insufficient documentation

## 2014-09-28 DIAGNOSIS — L720 Epidermal cyst: Secondary | ICD-10-CM | POA: Diagnosis not present

## 2014-09-28 DIAGNOSIS — Z923 Personal history of irradiation: Secondary | ICD-10-CM | POA: Insufficient documentation

## 2014-09-28 DIAGNOSIS — Z85038 Personal history of other malignant neoplasm of large intestine: Secondary | ICD-10-CM | POA: Insufficient documentation

## 2014-09-28 DIAGNOSIS — R2231 Localized swelling, mass and lump, right upper limb: Secondary | ICD-10-CM | POA: Diagnosis not present

## 2014-09-28 DIAGNOSIS — D2111 Benign neoplasm of connective and other soft tissue of right upper limb, including shoulder: Secondary | ICD-10-CM | POA: Diagnosis not present

## 2014-09-28 HISTORY — PX: MASS EXCISION: SHX2000

## 2014-09-28 LAB — GLUCOSE, CAPILLARY
Glucose-Capillary: 173 mg/dL — ABNORMAL HIGH (ref 70–99)
Glucose-Capillary: 138 mg/dL — ABNORMAL HIGH (ref 70–99)

## 2014-09-28 LAB — POCT HEMOGLOBIN-HEMACUE: HEMOGLOBIN: 13.5 g/dL (ref 12.0–15.0)

## 2014-09-28 SURGERY — EXCISION MASS
Anesthesia: Monitor Anesthesia Care | Site: Finger | Laterality: Right

## 2014-09-28 MED ORDER — CHLORHEXIDINE GLUCONATE 4 % EX LIQD: 60.0000 mL | Freq: Once | CUTANEOUS | Status: DC

## 2014-09-28 MED ORDER — LIDOCAINE HCL (PF) 0.5 % IJ SOLN
INTRAMUSCULAR | Status: DC | PRN
  Administered 2014-09-28: 30 mL via INTRAVENOUS

## 2014-09-28 MED ORDER — CEFAZOLIN SODIUM-DEXTROSE 2-3 GM-% IV SOLR: 2.0000 g | INTRAVENOUS | Status: DC

## 2014-09-28 MED ORDER — FENTANYL CITRATE 0.05 MG/ML IJ SOLN
INTRAMUSCULAR | Status: AC
  Filled 2014-09-28: qty 4

## 2014-09-28 MED ORDER — CEFAZOLIN SODIUM-DEXTROSE 2-3 GM-% IV SOLR
2.0000 g | INTRAVENOUS | Status: DC
  Administered 2014-09-28: 2 g via INTRAVENOUS

## 2014-09-28 MED ORDER — FENTANYL CITRATE 0.05 MG/ML IJ SOLN: 50.0000 ug | INTRAMUSCULAR | Status: DC | PRN

## 2014-09-28 MED ORDER — FENTANYL CITRATE 0.05 MG/ML IJ SOLN
INTRAMUSCULAR | Status: DC | PRN
  Administered 2014-09-28: 50 ug via INTRAVENOUS

## 2014-09-28 MED ORDER — BUPIVACAINE HCL (PF) 0.25 % IJ SOLN
INTRAMUSCULAR | Status: DC | PRN
  Administered 2014-09-28: 7 mL

## 2014-09-28 MED ORDER — HYDROCODONE-ACETAMINOPHEN 5-325 MG PO TABS: 1.0000 | ORAL_TABLET | Freq: Four times a day (QID) | ORAL | Status: DC | PRN

## 2014-09-28 MED ORDER — ONDANSETRON HCL 4 MG/2ML IJ SOLN
INTRAMUSCULAR | Status: DC | PRN
  Administered 2014-09-28: 4 mg via INTRAVENOUS

## 2014-09-28 MED ORDER — LACTATED RINGERS IV SOLN
INTRAVENOUS | Status: DC
  Administered 2014-09-28: 09:00:00 via INTRAVENOUS

## 2014-09-28 MED ORDER — CEFAZOLIN SODIUM-DEXTROSE 2-3 GM-% IV SOLR
INTRAVENOUS | Status: AC
  Filled 2014-09-28: qty 50

## 2014-09-28 MED ORDER — ONDANSETRON HCL 4 MG/2ML IJ SOLN: 4.0000 mg | Freq: Once | INTRAMUSCULAR | Status: DC | PRN

## 2014-09-28 MED ORDER — MIDAZOLAM HCL 2 MG/2ML IJ SOLN
1.0000 mg | INTRAMUSCULAR | Status: DC | PRN
Start: 2014-09-28 — End: 2014-09-28

## 2014-09-28 MED ORDER — PROPOFOL INFUSION 10 MG/ML OPTIME
INTRAVENOUS | Status: DC | PRN
  Administered 2014-09-28: 75 ug/kg/min via INTRAVENOUS

## 2014-09-28 MED ORDER — LIDOCAINE HCL (CARDIAC) 20 MG/ML IV SOLN
INTRAVENOUS | Status: DC | PRN
  Administered 2014-09-28: 30 mg via INTRAVENOUS

## 2014-09-28 MED ORDER — FENTANYL CITRATE 0.05 MG/ML IJ SOLN: 25.0000 ug | INTRAMUSCULAR | Status: DC | PRN

## 2014-09-28 SURGICAL SUPPLY — 50 items
BANDAGE COBAN STERILE 2 (GAUZE/BANDAGES/DRESSINGS) IMPLANT
BLADE MINI RND TIP GREEN BEAV (BLADE) IMPLANT
BLADE SURG 15 STRL LF DISP TIS (BLADE) ×1 IMPLANT
BLADE SURG 15 STRL SS (BLADE) ×2
BNDG CMPR 9X4 STRL LF SNTH (GAUZE/BANDAGES/DRESSINGS)
BNDG COHESIVE 1X5 TAN STRL LF (GAUZE/BANDAGES/DRESSINGS) ×1 IMPLANT
BNDG COHESIVE 3X5 TAN STRL LF (GAUZE/BANDAGES/DRESSINGS) IMPLANT
BNDG ESMARK 4X9 LF (GAUZE/BANDAGES/DRESSINGS) IMPLANT
BNDG GAUZE ELAST 4 BULKY (GAUZE/BANDAGES/DRESSINGS) IMPLANT
CHLORAPREP W/TINT 26ML (MISCELLANEOUS) ×2 IMPLANT
CORDS BIPOLAR (ELECTRODE) ×2 IMPLANT
COVER BACK TABLE 60X90IN (DRAPES) ×2 IMPLANT
COVER MAYO STAND STRL (DRAPES) ×2 IMPLANT
CUFF TOURNIQUET SINGLE 18IN (TOURNIQUET CUFF) ×1 IMPLANT
DECANTER SPIKE VIAL GLASS SM (MISCELLANEOUS) IMPLANT
DRAIN PENROSE 1/2X12 LTX STRL (WOUND CARE) IMPLANT
DRAPE EXTREMITY T 121X128X90 (DRAPE) ×2 IMPLANT
DRAPE SURG 17X23 STRL (DRAPES) ×2 IMPLANT
GAUZE SPONGE 4X4 12PLY STRL (GAUZE/BANDAGES/DRESSINGS) ×2 IMPLANT
GAUZE XEROFORM 1X8 LF (GAUZE/BANDAGES/DRESSINGS) ×2 IMPLANT
GLOVE BIOGEL PI IND STRL 6.5 (GLOVE) IMPLANT
GLOVE BIOGEL PI IND STRL 7.0 (GLOVE) IMPLANT
GLOVE BIOGEL PI IND STRL 8.5 (GLOVE) ×1 IMPLANT
GLOVE BIOGEL PI INDICATOR 6.5 (GLOVE) ×1
GLOVE BIOGEL PI INDICATOR 7.0 (GLOVE) ×1
GLOVE BIOGEL PI INDICATOR 8.5 (GLOVE) ×1
GLOVE SURG ORTHO 8.0 STRL STRW (GLOVE) ×2 IMPLANT
GOWN STRL REUS W/ TWL LRG LVL3 (GOWN DISPOSABLE) ×1 IMPLANT
GOWN STRL REUS W/TWL LRG LVL3 (GOWN DISPOSABLE) ×2
GOWN STRL REUS W/TWL XL LVL3 (GOWN DISPOSABLE) ×3 IMPLANT
NDL PRECISIONGLIDE 27X1.5 (NEEDLE) IMPLANT
NDL SAFETY ECLIPSE 18X1.5 (NEEDLE) IMPLANT
NEEDLE HYPO 18GX1.5 SHARP (NEEDLE)
NEEDLE PRECISIONGLIDE 27X1.5 (NEEDLE) IMPLANT
NS IRRIG 1000ML POUR BTL (IV SOLUTION) ×2 IMPLANT
PACK BASIN DAY SURGERY FS (CUSTOM PROCEDURE TRAY) ×2 IMPLANT
PAD CAST 3X4 CTTN HI CHSV (CAST SUPPLIES) IMPLANT
PADDING CAST ABS 3INX4YD NS (CAST SUPPLIES)
PADDING CAST ABS 4INX4YD NS (CAST SUPPLIES)
PADDING CAST ABS COTTON 3X4 (CAST SUPPLIES) IMPLANT
PADDING CAST ABS COTTON 4X4 ST (CAST SUPPLIES) ×1 IMPLANT
PADDING CAST COTTON 3X4 STRL (CAST SUPPLIES)
SPLINT PLASTER CAST XFAST 3X15 (CAST SUPPLIES) IMPLANT
SPLINT PLASTER XTRA FASTSET 3X (CAST SUPPLIES)
STOCKINETTE 4X48 STRL (DRAPES) ×2 IMPLANT
SUT VIC AB 4-0 P2 18 (SUTURE) IMPLANT
SYR BULB 3OZ (MISCELLANEOUS) ×2 IMPLANT
SYR CONTROL 10ML LL (SYRINGE) ×1 IMPLANT
TOWEL OR 17X24 6PK STRL BLUE (TOWEL DISPOSABLE) ×2 IMPLANT
UNDERPAD 30X30 INCONTINENT (UNDERPADS AND DIAPERS) ×2 IMPLANT

## 2014-09-28 NOTE — Anesthesia Postprocedure Evaluation (Signed)
  Anesthesia Post-op Note  Patient: Beth [name]  Procedure(s) Performed: Procedure(s) with comments: EXCISION MASS RIGHT MIDDLE FINGER (Right) - ANESTHESIA:  GENERAL, IV REGIONAL FAB  Patient Location: PACU  Anesthesia Type: MAC, Bier Block   Level of Consciousness: awake, alert  and oriented  Airway and Oxygen Therapy: Patient Spontanous Breathing  Post-op Pain: none  Post-op Assessment: Post-op Vital signs reviewed  Post-op Vital Signs: Reviewed  Last Vitals:  Filed Vitals:   09/28/14 1224  BP: 159/67  Pulse: 73  Temp: 36.4 C  Resp: 16    Complications: No apparent anesthesia complications

## 2014-09-28 NOTE — Op Note (Signed)
NAMECharlynn [name] NO.:  [id number]  MEDICAL RECORD NO.:  [id number]  LOCATION:                                 FACILITY:  PHYSICIAN:  Daryll Brod, M.D.            DATE OF BIRTH:  DATE OF PROCEDURE:  09/28/2014 DATE OF DISCHARGE:                              OPERATIVE REPORT   PREOPERATIVE DIAGNOSIS:  Mass, right middle finger.  POSTOPERATIVE DIAGNOSIS:  Mass, right middle finger.  OPERATION:  Excisional biopsy of mass, right middle finger.  SURGEON:  Daryll Brod, M.D.  ASSISTANT:  Leanora Cover, MD  ANESTHESIA:  Forearm-based IV regional with metacarpal block.  ANESTHESIOLOGIST:  Lorrene Reid, M.D.  HISTORY:  The patient is a 74 year old female with a long history of medical problems including arthritis and psoriasis.  She has undergone excision of nail plate on her right middle finger in the past, has developed a mass on the radial aspect of the tip indicative of a probable epidermal inclusion cyst.  She has elected to have this surgically excised.  Pre, peri and postoperative course have discussed along with risks and complications.  She is aware that there was no guarantee with the surgery; possibility of infection; recurrence of injury to arteries, nerves, tendons; incomplete relief of symptoms and dystrophy.  In the preoperative area, the patient is seen, the extremity marked by both patient and surgeon, antibiotic given.  PROCEDURE IN DETAIL:  The patient was brought to the operating room where a forearm-based IV regional anesthetic was carried out without difficulty.  She was prepped using ChloraPrep, supine position with the right arm free.  A 3-minute dry time was allowed.  Time-out taken confirming the patient and procedure.  After adequate anesthesia was afforded, an incision was made directly over the mass, she had some feeling.  Metacarpal block was given with 0.25% bupivacaine without epinephrine, approximately 6 mL was used.  The  incision was deepened immediately under the skin, a white pasty material immediately exuded from portion of the cyst with blunt and sharp dissection.  The remainder of the cyst was excised taking care to try to remove the entire wall. The specimen was sent to Pathology.  The wound was copiously irrigated with saline.  The skin was then closed with interrupted 5-0 nylon sutures.  A sterile compressive dressing and Alumafoam splint to the finger were applied.  On deflation of the tourniquet, remaining fingers were pinked.  She was taken to the recovery room for observation in satisfactory condition.  She will be discharged to home to return to the [place] in 1 week, on [place].          ______________________________ Daryll Brod, M.D.     GK/MEDQ  D:  09/28/2014  T:  09/28/2014  Job:  [id number]

## 2014-09-28 NOTE — Anesthesia Procedure Notes (Addendum)
Anesthesia Regional Block:  Bier block (IV Regional)  Pre-Anesthetic Checklist: ,, timeout performed, Correct Patient, Correct Site, Correct Laterality, Correct Procedure,, site marked, surgical consent,, at surgeon's request Needles:  Injection technique: Single-shot  Needle Type: Other      Needle Gauge: 20 and 20 G    Additional Needles: Bier block (IV Regional) Narrative:   Performed by: Personally    Procedure Name: MAC Date/Time: 09/28/2014 10:35 AM Performed by: BLOCKER, TIMOTHY Pre-anesthesia Checklist: Emergency Drugs available, Patient identified, Timeout performed, Suction available and Patient being monitored Patient Re-evaluated:Patient Re-evaluated prior to inductionOxygen Delivery Method: Simple face mask

## 2014-09-28 NOTE — Op Note (Signed)
Dictation Number [id number]

## 2014-09-28 NOTE — Brief Op Note (Signed)
09/28/2014  11:14 AM  PATIENT:  [name]  74 y.o. female  PRE-OPERATIVE DIAGNOSIS:  MASS RIGHT MIDDLE FINGER  POST-OPERATIVE DIAGNOSIS:  MASS RIGHT MIDDLE FINGER  PROCEDURE:  Procedure(s) with comments: EXCISION MASS RIGHT MIDDLE FINGER (Right) - ANESTHESIA:  GENERAL, IV REGIONAL FAB  SURGEON:  Surgeon(s) and Role:    * Daryll Brod, MD - Primary  PHYSICIAN ASSISTANT:   ASSISTANTS: K Jedadiah Abdallah,MD   ANESTHESIA:   local and regional  EBL:  Total I/O In: 600 [I.V.:600] Out: -   BLOOD ADMINISTERED:none  DRAINS: none   LOCAL MEDICATIONS USED:  BUPIVICAINE   SPECIMEN:  No Specimen and Excision  DISPOSITION OF SPECIMEN:  PATHOLOGY  COUNTS:  YES  TOURNIQUET:   Total Tourniquet Time Documented: Forearm (Right) - 20 minutes Total: Forearm (Right) - 20 minutes   DICTATION: .Other Dictation: Dictation Number [id number]  PLAN OF CARE: Discharge to home after PACU  PATIENT DISPOSITION:  PACU - hemodynamically stable.

## 2014-09-28 NOTE — Transfer of Care (Signed)
Immediate Anesthesia Transfer of Care Note  Patient: Beth [name]  Procedure(s) Performed: Procedure(s) with comments: EXCISION MASS RIGHT MIDDLE FINGER (Right) - ANESTHESIA:  GENERAL, IV REGIONAL FAB  Patient Location: PACU  Anesthesia Type:MAC and Bier block  Level of Consciousness: awake, alert  and oriented  Airway & Oxygen Therapy: Patient Spontanous Breathing and Patient connected to face mask oxygen  Post-op Assessment: Report given to PACU RN and Post -op Vital signs reviewed and stable  Post vital signs: Reviewed and stable  Complications: No apparent anesthesia complications

## 2014-09-28 NOTE — Anesthesia Preprocedure Evaluation (Signed)
Anesthesia Evaluation  Patient identified by MRN, date of birth, ID band Patient awake    Reviewed: Allergy & Precautions, NPO status , Patient's Chart, lab work & pertinent test results  Airway Mallampati: I  TM Distance: >3 FB Neck ROM: Full    Dental  (+) Dental Advisory Given, Partial Lower   Pulmonary former smoker,  breath sounds clear to auscultation        Cardiovascular hypertension, Pt. on medications Rhythm:Regular Rate:Normal     Neuro/Psych    GI/Hepatic   Endo/Other  diabetes, Well Controlled, Type 2, Oral Hypoglycemic Agents  Renal/GU      Musculoskeletal   Abdominal   Peds  Hematology   Anesthesia Other Findings   Reproductive/Obstetrics                             Anesthesia Physical Anesthesia Plan  ASA: II  Anesthesia Plan: MAC and Bier Block   Post-op Pain Management:    Induction: Intravenous  Airway Management Planned: Simple Face Mask  Additional Equipment:   Intra-op Plan:   Post-operative Plan:   Informed Consent: I have reviewed the patients History and Physical, chart, labs and discussed the procedure including the risks, benefits and alternatives for the proposed anesthesia with the patient or authorized representative who has indicated his/her understanding and acceptance.   Dental advisory given  Plan Discussed with: CRNA, Anesthesiologist and Surgeon  Anesthesia Plan Comments:         Anesthesia Quick Evaluation

## 2014-09-28 NOTE — Discharge Instructions (Addendum)

## 2014-09-28 NOTE — H&P (Signed)
Beth [name] is a 74 year old right hand dominant patient. She has been seen by Dr. Daylene Katayama in the past multiple times including ablation of nails. She has psoriatic arthritis and nail deformities. This was to her right long finger and right index and thumb. She is complaining of deformity of her index finger with a mass on the ulnar aspect and a mass on her right middle finger. This has been gradually increasing in size. She has a history of diabetes and arthritis. She has no history of thyroid problems or gout. She has not had any treatment. She is not complaining of a great deal of pain or discomfort with this.  PAST MEDICAL HISTORY:  She has no drug allergies. Medications are listed and documented in the chart. She has had colon cancer and the surgery on her fingers.  FAMILY MEDICAL HISTORY: Positive for diabetes, high BP and arthritis.  SOCIAL HISTORY:  She does not smoke or use alcohol. She is married and retired.   REVIEW OF SYSTEMS: Positive for colon cancer, glasses, and hoarseness, otherwise negative 14 points.  Beth [name] is an 74 y.o. female.   Chief Complaint: mass right middle finger HPI: see above  Past Medical History  Diagnosis Date  . Wears glasses   . Wears partial dentures     top and bottom partials  . Chronic back pain   . Arthritis     neck and all over  . Hypertension   . Diabetes mellitus without complication   . Cancer 1998    hx colon ca-chemo radiation  . Hyperlipemia     Past Surgical History  Procedure Laterality Date  . Appendectomy    . Abdominal hysterectomy    . Partial colectomy  1998    cancer-rad/chemo  . Combined mediastinoscopy and bronchoscopy  2002    biopsy  . Colonoscopy      many  . Lipoma excision    . Dg arthro thumb*r*  2011    ablasion rt thumb nailbed  . Skin full thickness graft Right 07/23/2013    Procedure: NAIL PLATE ABLATION WITH FULL THICKNESS SKIN GRAFT FROM RIGHT   MEDIAL ELBOW TO RIGHT LONG FINGER NAIL BED;   Surgeon: Cammie Sickle., MD;  Location: [place];  Service: Orthopedics;  Laterality: Right;  . I&d extremity Right 07/23/2013    Procedure: IRRIGATION AND DEBRIDEMENT DIP JOINT RIGHT INDEX FINGER, EXCISE MUCOID CYST RIGHT INDEX FINGER  ;  Surgeon: Cammie Sickle., MD;  Location: [place] [facility];  Service: Orthopedics;  Laterality: Right;    History reviewed. No pertinent family history. Social History:  reports that she quit smoking about 43 years ago. She does not have any smokeless tobacco history on file. She reports that she drinks alcohol. She reports that she does not use illicit drugs.  Allergies: No Known Allergies  Medications Prior to Admission  Medication Sig Dispense Refill  . acetaminophen (TYLENOL) 500 MG tablet Take 500 mg by mouth every 6 (six) hours as needed for pain.    . canagliflozin (INVOKANA) 100 MG TABS tablet Take 100 mg by mouth daily.    Marland Kitchen glipiZIDE (GLUCOTROL) 10 MG tablet Take 10 mg by mouth 2 (two) times daily before a meal.    . lovastatin (MEVACOR) 10 MG tablet Take 10 mg by mouth at bedtime.    . metFORMIN (GLUCOPHAGE) 1000 MG tablet Take 1,000 mg by mouth 2 (two) times daily with a meal.    .  Multiple Vitamins-Minerals (MULTIVITAMIN WITH MINERALS) tablet Take 1 tablet by mouth daily.    . ramipril (ALTACE) 10 MG capsule Take 10 mg by mouth daily.    . ranitidine (ZANTAC) 150 MG tablet Take 150 mg by mouth 2 (two) times daily.      Results for orders placed or performed during the hospital encounter of 09/28/14 (from the past 48 hour(s))  Basic metabolic panel     Status: Abnormal   Collection Time: 09/27/14  2:30 PM  Result Value Ref Range   Sodium 137 135 - 145 mmol/L    Comment: Please note change in reference range.   Potassium 4.5 3.5 - 5.1 mmol/L    Comment: Please note change in reference range.   Chloride 106 96 - 112 mEq/L   CO2 23 19 - 32 mmol/L   Glucose, Bld 123 (H) 70 - 99 mg/dL   BUN 25 (H) 6 - 23  mg/dL   Creatinine, Ser 0.92 0.50 - 1.10 mg/dL   Calcium 9.9 8.4 - 10.5 mg/dL   GFR calc non Af Amer 60 (L) >90 mL/min   GFR calc Af Amer 70 (L) >90 mL/min    Comment: (NOTE) The eGFR has been calculated using the CKD EPI equation. This calculation has not been validated in all clinical situations. eGFR's persistently <90 mL/min signify possible Chronic Kidney Disease.    Anion gap 8 5 - 15  Glucose, capillary     Status: Abnormal   Collection Time: 09/28/14  9:13 AM  Result Value Ref Range   Glucose-Capillary 173 (H) 70 - 99 mg/dL  Hemoglobin-hemacue, POC     Status: None   Collection Time: 09/28/14  9:13 AM  Result Value Ref Range   Hemoglobin 13.5 12.0 - 15.0 g/dL    No results found.   Pertinent items are noted in HPI.  Blood pressure 136/49, pulse 72, temperature 98.4 F (36.9 C), temperature source Oral, resp. rate 16, height _0  (1.575 m), weight 72.179 kg (159 lb 2 oz), SpO2 100 %.  General appearance: alert, cooperative and appears stated age Head: Normocephalic, without obvious abnormality Neck: no JVD Resp: clear to auscultation bilaterally Cardio: regular rate and rhythm, S1, S2 normal, no murmur, click, rub or gallop GI: soft, non-tender; bowel sounds normal; no masses,  no organomegaly Extremities: mass right middle finger Pulses: 2+ and symmetric Skin: Skin color, texture, turgor normal. No rashes or lesions Neurologic: Grossly normal Incision/Wound: na  Assessment/Plan X-rays reveal degenerative changes with collapse of the radial condyle of the middle phalanx index finger right hand. There is no deformity to the bone on her middle finger.  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  She desires having that excised. She does have 2 abrasions on that finger and we will wait until this is healed. She would like to have this done after the first of the year. She is aware this may be an epidermal  inclusion cyst. There is always the potential it could be a foreign body granuloma or associated with psoriasis. Pre, peri and post op care are discussed along with risks and complications. Patient is aware there is no guarantee with surgery, possibility of infection, injury to arteries, nerves, and tendons, incomplete relief and dystrophy. She would like to proceed and this will be scheduled as an outpatient under regional anesthesia.  Michelina Mexicano R 09/28/2014, 10:27 AM

## 2014-09-29 ENCOUNTER — Encounter (HOSPITAL_BASED_OUTPATIENT_CLINIC_OR_DEPARTMENT_OTHER): Payer: Self-pay | Admitting: Orthopedic Surgery

## 2014-10-22 DIAGNOSIS — E119 Type 2 diabetes mellitus without complications: Secondary | ICD-10-CM | POA: Diagnosis not present

## 2014-12-20 DIAGNOSIS — E119 Type 2 diabetes mellitus without complications: Secondary | ICD-10-CM | POA: Diagnosis not present

## 2014-12-20 DIAGNOSIS — Z961 Presence of intraocular lens: Secondary | ICD-10-CM | POA: Diagnosis not present

## 2015-01-17 ENCOUNTER — Other Ambulatory Visit (HOSPITAL_COMMUNITY): Payer: Self-pay | Admitting: Family Medicine

## 2015-01-17 DIAGNOSIS — Z1231 Encounter for screening mammogram for malignant neoplasm of breast: Secondary | ICD-10-CM

## 2015-01-28 DIAGNOSIS — E782 Mixed hyperlipidemia: Secondary | ICD-10-CM | POA: Diagnosis not present

## 2015-01-28 DIAGNOSIS — I10 Essential (primary) hypertension: Secondary | ICD-10-CM | POA: Diagnosis not present

## 2015-01-28 DIAGNOSIS — E119 Type 2 diabetes mellitus without complications: Secondary | ICD-10-CM | POA: Diagnosis not present

## 2015-01-28 LAB — BASIC METABOLIC PANEL
BUN: 38 mg/dL — AB (ref 4–21)
Creatinine: 1.1 mg/dL (ref 0.5–1.1)
GLUCOSE: 109 mg/dL
Potassium: 4.8 mmol/L (ref 3.4–5.3)
SODIUM: 134 mmol/L — AB (ref 137–147)

## 2015-01-28 LAB — LIPID PANEL
CHOLESTEROL: 151 mg/dL (ref 0–200)
HDL: 37 mg/dL (ref 35–70)
LDL Cholesterol: 71 mg/dL
LDl/HDL Ratio: 4.1
Triglycerides: 216 mg/dL — AB (ref 40–160)

## 2015-01-28 LAB — HEMOGLOBIN A1C: HEMOGLOBIN A1C: 6.9 % — AB (ref 4.0–6.0)

## 2015-02-09 ENCOUNTER — Ambulatory Visit (HOSPITAL_COMMUNITY)
Admission: RE | Admit: 2015-02-09 | Discharge: 2015-02-09 | Disposition: A | Discharge status: Home / Self Care | Payer: Medicare Other | Source: Ambulatory Visit | Attending: Family Medicine | Admitting: Family Medicine

## 2015-02-09 DIAGNOSIS — Z1231 Encounter for screening mammogram for malignant neoplasm of breast: Secondary | ICD-10-CM | POA: Diagnosis not present

## 2015-05-07 DIAGNOSIS — B029 Zoster without complications: Secondary | ICD-10-CM | POA: Diagnosis not present

## 2015-05-11 DIAGNOSIS — L718 Other rosacea: Secondary | ICD-10-CM | POA: Diagnosis not present

## 2015-05-11 DIAGNOSIS — L570 Actinic keratosis: Secondary | ICD-10-CM | POA: Diagnosis not present

## 2015-06-08 DIAGNOSIS — Z23 Encounter for immunization: Secondary | ICD-10-CM | POA: Diagnosis not present

## 2015-06-22 DIAGNOSIS — N189 Chronic kidney disease, unspecified: Secondary | ICD-10-CM | POA: Diagnosis not present

## 2015-06-22 DIAGNOSIS — Z794 Long term (current) use of insulin: Secondary | ICD-10-CM | POA: Diagnosis not present

## 2015-06-22 DIAGNOSIS — E118 Type 2 diabetes mellitus with unspecified complications: Secondary | ICD-10-CM | POA: Diagnosis not present

## 2015-06-22 DIAGNOSIS — B372 Candidiasis of skin and nail: Secondary | ICD-10-CM | POA: Diagnosis not present

## 2015-06-22 DIAGNOSIS — I10 Essential (primary) hypertension: Secondary | ICD-10-CM | POA: Diagnosis not present

## 2015-06-22 DIAGNOSIS — R300 Dysuria: Secondary | ICD-10-CM | POA: Diagnosis not present

## 2015-07-08 DIAGNOSIS — E118 Type 2 diabetes mellitus with unspecified complications: Secondary | ICD-10-CM | POA: Diagnosis not present

## 2015-07-08 DIAGNOSIS — R112 Nausea with vomiting, unspecified: Secondary | ICD-10-CM | POA: Diagnosis not present

## 2015-07-18 ENCOUNTER — Encounter: Payer: Self-pay | Admitting: Internal Medicine

## 2015-07-18 ENCOUNTER — Ambulatory Visit (INDEPENDENT_AMBULATORY_CARE_PROVIDER_SITE_OTHER): Payer: Medicare Other | Admitting: Internal Medicine

## 2015-07-18 VITALS — BP 118/64 | Temp 98.3°F | Resp 12 | Wt 153.4 lb

## 2015-07-18 DIAGNOSIS — R748 Abnormal levels of other serum enzymes: Secondary | ICD-10-CM

## 2015-07-18 DIAGNOSIS — E1165 Type 2 diabetes mellitus with hyperglycemia: Secondary | ICD-10-CM

## 2015-07-18 LAB — COMPREHENSIVE METABOLIC PANEL
ALT: 16 U/L (ref 0–35)
AST: 15 U/L (ref 0–37)
Albumin: 3.9 g/dL (ref 3.5–5.2)
Alkaline Phosphatase: 81 U/L (ref 39–117)
BILIRUBIN TOTAL: 0.2 mg/dL (ref 0.2–1.2)
BUN: 20 mg/dL (ref 6–23)
CALCIUM: 9.3 mg/dL (ref 8.4–10.5)
CO2: 26 meq/L (ref 19–32)
Chloride: 104 mEq/L (ref 96–112)
Creatinine, Ser: 0.87 mg/dL (ref 0.40–1.20)
GFR: 67.66 mL/min (ref >60.00)
Glucose, Bld: 287 mg/dL — ABNORMAL HIGH (ref 70–99)
Potassium: 4.3 mEq/L (ref 3.5–5.1)
Sodium: 137 mEq/L (ref 135–145)
Total Protein: 6.8 g/dL (ref 6.0–8.3)

## 2015-07-18 LAB — HEMOGLOBIN A1C: Hgb A1c MFr Bld: 8.4 % — ABNORMAL HIGH (ref 4.6–6.5)

## 2015-07-18 LAB — LIPASE: LIPASE: 69 U/L — AB (ref 11.0–59.0)

## 2015-07-18 MED ORDER — LIRAGLUTIDE 18 MG/3ML ~~LOC~~ SOPN
0.6000 mg | PEN_INJECTOR | Freq: Every day | SUBCUTANEOUS | Status: DC
Start: 2015-07-18 — End: 2015-07-19

## 2015-07-18 MED ORDER — GLIPIZIDE ER 5 MG PO TB24: 5.0000 mg | ORAL_TABLET | Freq: Every day | ORAL | Status: DC

## 2015-07-18 NOTE — Progress Notes (Addendum)
Patient ID: Beth [name], female   DOB: 1941/07/23, 74 y.o.   MRN: [id number]  HPI: Beth [name] is a 74 y.o.-year-old female, referred by her PCP, Dr. Rex Kras, for management of DM2, dx in 1990s non-insulin-dependent, controlled, without complications.  Last hemoglobin A1c was: Lab Results  Component Value Date   HGBA1C 6.9* 01/28/2015   Pt is on a regimen of: - Metformin ER 1000 mg 2x a day, with meals - Glipizide XL 10 mg 2x a day Patient was previously on Januvia and then Victoza. Of note, I received the records from PCP, and the lipase was elevated, at 74 (0-59) on 07/08/2015, few days after increasing Victoza dose to 1.2 mg daily. She had 1 episode of vomiting after eating out but no abdominal pain, and no nausea.  She was on Invokana 100 mg in a.m. >> yeast infection.  Pt checks her sugars 1x a day and they are: - am: after stopping Victoza: 211-282, on 0.6 mg Victoza >> CBG decreased to 129-140 - 2h after b'fast: n/c - before lunch: n/c - 2h after lunch: n/c - before dinner: n/c - 2h after dinner: n/c - bedtime: n/c - nighttime: n/c No lows. Lowest sugar was 123 (on Victoza); she has hypoglycemia awareness at 70.  Highest sugar was 282.  Glucometer: ReliOn  Pt's meals are: - Breakfast: scrambled eggs, toast or cereals or yoghurt - Lunch: crackers and cheese, carrots, apple - Dinner: meat + salad/veggies + starch - Snacks: 2-3: cashews, cheese and carrots, almost milk + graham crackers  - no CKD, but last BUN/creatinine were higher than previous:  Lab Results  Component Value Date   BUN 38* 01/28/2015   CREATININE 1.1 01/28/2015  On Ramipril. - last set of lipids: Lab Results  Component Value Date   CHOL 151 01/28/2015   HDL 37 01/28/2015   LDLCALC 71 01/28/2015   TRIG 216* 01/28/2015  On Lovastatin. - last eye exam was in 2015. No DR. Dr Gershon Crane. - no numbness and tingling in her feet. She had a normal foot exam by PCP on 06/22/2015.  Pt has FH of DM  in [place] and [place].  She has a history of colon cancer 18 years ago. She remembers that the mass perforated her colon at that time.  ROS: Constitutional: no weight gain/loss, no fatigue, no subjective hyperthermia/hypothermia Eyes: no blurry vision, no xerophthalmia ENT: no sore throat, no nodules palpated in throat, no dysphagia/odynophagia, no hoarseness Cardiovascular: no CP/SOB/palpitations/leg swelling Respiratory: no cough/SOB Gastrointestinal: no N/V/D/C Musculoskeletal: no muscle/joint aches Skin: no rashes Neurological: no tremors/numbness/tingling/dizziness Psychiatric: no depression/anxiety  Past Medical History  Diagnosis Date  . Wears glasses   . Wears partial dentures     top and bottom partials  . Chronic back pain   . Arthritis     neck and all over  . Hypertension   . Diabetes mellitus without complication ([place])   . Hyperlipemia   . 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 Cancer ([place]) 1998    hx colon ca-chemo radiation; lung   Past Surgical History  Procedure Laterality Date  . Appendectomy    . Abdominal hysterectomy    . Partial colectomy  1998    cancer-rad/chemo  . Combined mediastinoscopy and bronchoscopy  2002    biopsy  . Colonoscopy      many  . Lipoma excision    . Dg arthro thumb*r*  2011    ablasion rt thumb nailbed  .  Skin full thickness graft Right 07/23/2013    Procedure: NAIL PLATE ABLATION WITH FULL THICKNESS SKIN GRAFT FROM RIGHT   MEDIAL ELBOW TO RIGHT LONG FINGER NAIL BED;  Surgeon: Cammie Sickle., MD;  Location: [facility];  Service: Orthopedics;  Laterality: Right;  . I&d extremity Right 07/23/2013    Procedure: IRRIGATION AND DEBRIDEMENT DIP JOINT RIGHT INDEX FINGER, EXCISE MUCOID CYST RIGHT INDEX FINGER  ;  Surgeon: Cammie Sickle., MD;  Location: [facility];  Service: Orthopedics;  Laterality: Right;  . Mass excision Right 09/28/2014    Procedure: EXCISION MASS RIGHT MIDDLE FINGER;  Surgeon: Daryll Brod, MD;  Location: [facility];  Service: Orthopedics;  Laterality: Right;  ANESTHESIA:  GENERAL, IV REGIONAL FAB  . Cataract extraction, bilateral     Social History   Social History  . Marital Status: Married    Spouse Name: N/A  . Number of Children: 3   Occupational History  . retired   Social History Main Topics  . Smoking status: Former Smoker -- 0.50 packs/day    Types: Cigarettes    Quit date: 07/22/1971  . Smokeless tobacco: Not on file  . Alcohol Use: No     Comment: rare  . Drug Use: No   Current Outpatient Prescriptions on File Prior to Visit  Medication Sig  . acetaminophen (TYLENOL) 500 MG tablet Take 500 mg by mouth every 6 (six) hours as needed for pain.  . Glipizide XL 10 mg  Take 2x a day  . HYDROcodone-acetaminophen (NORCO) 5-325 MG per tablet Take 1 tablet by mouth every 6 (six) hours as needed for moderate pain.  Marland Kitchen lovastatin (MEVACOR) 10 MG tablet Take 10 mg by mouth at bedtime.  . metFORMIN (GLUCOPHAGE) 1000 MG tablet Take 1,000 mg by mouth 2 (two) times daily with a meal.  . ramipril (ALTACE) 10 MG capsule Take 10 mg by mouth daily.  . ranitidine (ZANTAC) 150 MG tablet Take 150 mg by mouth 2 (two) times daily.  No Known Allergies Family History  Problem Relation Age of Onset  . Cancer Mother     colon   . Hypertension Father   . CVA Father   . Obesity Brother    PE: BP 118/64 mmHg  Temp(Src) 98.3 F (36.8 C) (Oral)  Resp 12  Wt 153 lb 6.4 oz (69.582 kg) Body mass index is 28.05 kg/(m^2). Wt Readings from Last 3 Encounters:  07/18/15 153 lb 6.4 oz (69.582 kg)  09/28/14 159 lb 2 oz (72.179 kg)  07/23/13 168 lb 2 oz (76.261 kg)   Constitutional: overweight, in NAD Eyes: PERRLA, EOMI, no exophthalmos ENT: moist mucous membranes, no thyromegaly, no cervical lymphadenopathy Cardiovascular: RRR, No MRG Respiratory: CTA B Gastrointestinal: abdomen soft, NT, ND, BS+ Musculoskeletal: no deformities, strength intact in all  4 Skin: moist, warm, no rashes Neurological: no tremor with outstretched hands, DTR normal in all 4  ASSESSMENT: 1. DM2, non-insulin-dependent, controlled, without complications  PLAN:  1. Patient with long-standing,  fairly well controlled diabetes, on oral antidiabetic regimen, which became insufficient. She has been on Invokana in the past but had to stop because of severe yeast infections. She then started Victoza, which has worked tremendously well for her, even on the very low dose of 0.6 mg daily. Her PCP checked a lipase 3-4 days after increasing the dose at 1.2 mg daily, and this was elevated. She denies abdominal pain, nausea, but had an episode of vomiting  after eating out. She does not think that this was caused by the medication, but by something that she ate. At this visit, she tells me that she is very interested in retrying Victoza despite previously elevated lipase. We discussed at length about the fact that a high lipase means a possible episode of pancreatitis, and Victoza could be a culprit. We discussed about other possible causes of pancreatitis, to include alcoholic, gallbladder-related, and hyper triglyceridemia-related. Patients with diabetes in general are also at higher risk for pancreatitis. Both patient and her daughter would really like for her to try the GLP-1 receptor agonist again, since they saw tremendously improved blood sugars with it. After our discussion, they are aware of the risk of pancreatitis and patient agreed to call me at the first sign of abdominal pain or nausea.  - I agreed to check a lipase today, and if lower, to allow her to retry the Victoza, but at the lowest dose of 0.6 mg daily, which proved beneficial for her. She will not start the medication until I let her know about the results of her lipase level today. If this is high, or if she develops side effects, she agrees to start basal insulin at bedtime. She is aware about how to inject the insulin  correctly, and I went over correct injection techniques with her at this visit. - I suggested to:  Patient Instructions  Please continue: - Metformin ER 1000 mg 2x a day - Glipizide XL 10 mg 2x a day.  Will wait for the lab results and let you know if we can restart Victoza 0.6 mg daily in am.  Please stop at the lab.  Please come back for a follow-up appointment in 1 month.  - Strongly advised her to start checking sugars at different times of the day - check 1-2 times a day, rotating checks - given sugar log and advised how to fill it and to bring it at next appt  - given foot care handout and explained the principles  - given instructions for hypoglycemia management "15-15 rule"  - advised for yearly eye exams >> She has one every year - I will check a hemoglobin A1c today, and also a CMP. - Return to clinic in 1 mo with sugar log   Component     Latest Ref Rng 07/18/2015  Sodium     135 - 145 mEq/L 137  Potassium     3.5 - 5.1 mEq/L 4.3  Chloride     96 - 112 mEq/L 104  CO2     19 - 32 mEq/L 26  Glucose     70 - 99 mg/dL 287 (H)  BUN     6 - 23 mg/dL 20  Creatinine     0.40 - 1.20 mg/dL 0.87  Total Bilirubin     0.2 - 1.2 mg/dL 0.2  Alkaline Phosphatase     39 - 117 U/L 81  AST     0 - 37 U/L 15  ALT     0 - 35 U/L 16  Total Protein     6.0 - 8.3 g/dL 6.8  Albumin     3.5 - 5.2 g/dL 3.9  Calcium     8.4 - 10.5 mg/dL 9.3  GFR     >60.00 mL/min 67.66  Lipase     11.0 - 59.0 U/L 69.0 (H)  Hemoglobin A1C     4.6 - 6.5 % 8.4 (H)   Lipase is still elevated,  therefore, I would not recommend restarting Victoza. Hemoglobin A1c is higher than before, which is consistent with the elevated CBG in her log. I would suggest starting basal insulin: Levemir 12 units at bedtime, to increase to 15 units if the sugars in the morning are not lower than 150. CMP normal except high Glu.

## 2015-07-18 NOTE — Patient Instructions (Signed)
Please continue: - Metformin ER 1000 mg 2x a day - Glipizide XL 10 mg 2x a day.  Will wait for the lab results and let you know if we can restart Victoza 0.6 mg daily in am.  Please stop at the lab.  Please come back for a follow-up appointment in 1 month.  PATIENT INSTRUCTIONS FOR TYPE 2 DIABETES:  **Please join MyChart!** - see attached instructions about how to join if you have not done so already.  DIET AND EXERCISE Diet and exercise is an important part of diabetic treatment.  We recommended aerobic exercise in the form of brisk walking (working between 40-60% of maximal aerobic capacity, similar to brisk walking) for 150 minutes per week (such as 30 minutes five days per week) along with 3 times per week performing 'resistance' training (using various gauge rubber tubes with handles) 5-10 exercises involving the major muscle groups (upper body, lower body and core) performing 10-15 repetitions (or near fatigue) each exercise. Start at half the above goal but build slowly to reach the above goals. If limited by weight, joint pain, or disability, we recommend daily walking in a swimming pool with water up to waist to reduce pressure from joints while allow for adequate exercise.    BLOOD GLUCOSES Monitoring your blood glucoses is important for continued management of your diabetes. Please check your blood glucoses 2-4 times a day: fasting, before meals and at bedtime (you can rotate these measurements - e.g. one day check before the 3 meals, the next day check before 2 of the meals and before bedtime, etc.).   HYPOGLYCEMIA (low blood sugar) Hypoglycemia is usually a reaction to not eating, exercising, or taking too much insulin/ other diabetes drugs.  Symptoms include tremors, sweating, hunger, confusion, headache, etc. Treat IMMEDIATELY with 15 grams of Carbs: . 4 glucose tablets .  cup regular juice/soda . 2 tablespoons raisins . 4 teaspoons sugar . 1 tablespoon honey Recheck  blood glucose in 15 mins and repeat above if still symptomatic/blood glucose <100.  RECOMMENDATIONS TO REDUCE YOUR RISK OF DIABETIC COMPLICATIONS: * Take your prescribed MEDICATION(S) * Follow a DIABETIC diet: Complex carbs, fiber rich foods, (monounsaturated and polyunsaturated) fats * AVOID saturated/trans fats, high fat foods, >2,300 mg salt per day. * EXERCISE at least 5 times a week for 30 minutes or preferably daily.  * DO NOT SMOKE OR DRINK more than 1 drink a day. * Check your FEET every day. Do not wear tightfitting shoes. Contact us if you develop an ulcer * See your EYE doctor once a year or more if needed * Get a FLU shot once a year * Get a PNEUMONIA vaccine once before and once after age 74 years  GOALS:  * Your Hemoglobin A1c of <7%  * fasting sugars need to be <130 * after meals sugars need to be <180 (2h after you start eating) * Your Systolic BP should be 458 or lower  * Your Diastolic BP should be 80 or lower  * Your HDL (Good Cholesterol) should be 40 or higher  * Your LDL (Bad Cholesterol) should be 100 or lower. * Your Triglycerides should be 150 or lower  * Your Urine microalbumin (kidney function) should be <30 * Your Body Mass Index should be 25 or lower    Please consider the following ways to cut down carbs and fat and increase fiber and micronutrients in your diet: - substitute whole grain for white bread or pasta - substitute brown rice for  white rice - substitute 90-calorie flat bread pieces for slices of bread when possible - substitute sweet potatoes or yams for white potatoes - substitute humus for margarine - substitute tofu for cheese when possible - substitute almond or rice milk for regular milk (would not drink soy milk daily due to concern for soy estrogen influence on breast cancer risk) - substitute dark chocolate for other sweets when possible - substitute water - can add lemon or orange slices for taste - for diet sodas (artificial  sweeteners will trick your body that you can eat sweets without getting calories and will lead you to overeating and weight gain in the long run) - do not skip breakfast or other meals (this will slow down the metabolism and will result in more weight gain over time)  - can try smoothies made from fruit and almond/rice milk in am instead of regular breakfast - can also try old-fashioned (not instant) oatmeal made with almond/rice milk in am - order the dressing on the side when eating salad at a restaurant (pour less than half of the dressing on the salad) - eat as little meat as possible - can try juicing, but should not forget that juicing will get rid of the fiber, so would alternate with eating raw veg./fruits or drinking smoothies - use as little oil as possible, even when using olive oil - can dress a salad with a mix of balsamic vinegar and lemon juice, for e.g. - use agave nectar, stevia sugar, or regular sugar rather than artificial sweateners - steam or broil/roast veggies  - snack on veggies/fruit/nuts (unsalted, preferably) when possible, rather than processed foods - reduce or eliminate aspartame in diet (it is in diet sodas, chewing gum, etc) Read the labels!  Try to read Dr. Janene Harvey book: "Program for Reversing Diabetes" for other ideas for healthy eating.

## 2015-07-19 ENCOUNTER — Telehealth: Payer: Self-pay | Admitting: *Deleted

## 2015-07-19 MED ORDER — INSULIN DETEMIR 100 UNIT/ML FLEXPEN
15.0000 [IU] | PEN_INJECTOR | Freq: Every day | SUBCUTANEOUS | Status: DC
Start: 2015-07-19 — End: 2016-01-16

## 2015-07-19 NOTE — Telephone Encounter (Signed)
Please read message below and advise.  

## 2015-07-19 NOTE — Telephone Encounter (Signed)
Patients daughter called about her moms test results.  CB# [phone]

## 2015-07-19 NOTE — Telephone Encounter (Signed)
  Notes Recorded by Philemon Kingdom, MD on 07/19/2015 at 8:05 AM Larene Beach, can you please call pt: Lipase is still elevated, therefore, I would not recommend restarting Victoza. I would suggest starting basal insulin: Levemir 12 units at bedtime, to increase to 15 units if the sugars in the morning are not lower than 150. I sent a Rx to [facility]. Hemoglobin A1c is higher than before, which is consistent with the elevated CBG in her log and the Glu on CMP. Liver tests and kidney tests are normal.

## 2015-07-20 NOTE — Telephone Encounter (Signed)
Called pt and advised her (she said she was writing it all down), per Dr Arman Filter message. Pt voiced understanding.

## 2015-08-23 ENCOUNTER — Encounter: Payer: Self-pay | Admitting: Internal Medicine

## 2015-08-23 ENCOUNTER — Ambulatory Visit (INDEPENDENT_AMBULATORY_CARE_PROVIDER_SITE_OTHER): Payer: Medicare Other | Admitting: Internal Medicine

## 2015-08-23 VITALS — BP 124/68 | HR 91 | Temp 97.8°F | Resp 12 | Wt 155.6 lb

## 2015-08-23 DIAGNOSIS — E1165 Type 2 diabetes mellitus with hyperglycemia: Secondary | ICD-10-CM | POA: Diagnosis not present

## 2015-08-23 MED ORDER — GLIPIZIDE ER 5 MG PO TB24: ORAL_TABLET | ORAL | Status: DC

## 2015-08-23 NOTE — Patient Instructions (Signed)
Please continue: - Metformin ER 1000 mg 2x a day, with meals - Levemir 15 units at bedtime  Please decrease the Glipizide XL before dinner to 1/2 of a tablet (2.5 mg). If you plan to eat a particularly high meal, please take 2x 1/2 tablets (5 mg).  If you still have low blood sugars in am after you decrease Glipizide XL, please decrease the dose of Levemir by 2 units.  Please return in 2 months with your sugar log.

## 2015-08-23 NOTE — Progress Notes (Signed)
Patient ID: Beth [name], female   DOB: October 06, 1940, 74 y.o.   MRN: [id number]  HPI: Beth [name] is a 74 y.o.-year-old female, returning for f/u for DM2, dx in 1990s insulin-dependent, uncontrolled, without complications. Last visit 1 mo ago.  Last hemoglobin A1c was: Lab Results  Component Value Date   HGBA1C 8.4* 07/18/2015   HGBA1C 6.9* 01/28/2015   Pt is on a regimen of: - Metformin ER 1000 mg 2x a day, with meals - Glipizide XL 5 mg 2x a day - Levemir 12 >> 15 units at bedtime - started 06/2015 Patient was previously on Januvia and then Victoza. Of note, I received the records from PCP, and the lipase was elevated, at 74 (0-59) on 07/08/2015, then 69 on 07/18/2015. She was on Invokana 100 mg in a.m. >> yeast infection.  Pt checks her sugars 1x a day and they are MUCH better: - am: after stopping Victoza: 211-282, on 0.6 mg Victoza >> CBG decreased to 129-140 >> 62-115, 125 - 2h after b'fast: n/c >> 125-140 - before lunch: n/c >> 105-143 - 2h after lunch: n/c >> 116-134 - before dinner: n/c >> 91-122 - 2h after dinner: n/c >> 148-180 - bedtime: n/c >> 134-192, 217 - nighttime: n/c No lows. Lowest sugar was 123 (on Victoza) >> 62; she has hypoglycemia awareness at 70.  Highest sugar was 282 >> 217.  Glucometer: ReliOn  Pt's meals are: - Breakfast: scrambled eggs, toast or cereals or yoghurt - Lunch: crackers and cheese, carrots, apple - Dinner: meat + salad/veggies + starch - Snacks: 2-3: cashews, cheese and carrots, almost milk + graham crackers  - no CKD, but last BUN/creatinine were higher than previous:  Lab Results  Component Value Date   BUN 20 07/18/2015   CREATININE 0.87 07/18/2015  On Ramipril. - last set of lipids: Lab Results  Component Value Date   CHOL 151 01/28/2015   HDL 37 01/28/2015   LDLCALC 71 01/28/2015   TRIG 216* 01/28/2015  On Lovastatin. - last eye exam was in 2015. No DR. Dr Gershon Crane. - no numbness and tingling in her feet. She had  a normal foot exam by PCP on 06/22/2015.  Pt has FH of DM in M and F.  She has a history of colon cancer 18 years ago. She remembers that the mass perforated her colon at that time.  ROS: Constitutional: no weight gain/loss, no fatigue, no subjective hyperthermia/hypothermia Eyes: no blurry vision, no xerophthalmia ENT: no sore throat, no nodules palpated in throat, no dysphagia/odynophagia, no hoarseness Cardiovascular: no CP/SOB/palpitations/leg swelling Respiratory: no cough/SOB Gastrointestinal: no N/V/D/C Musculoskeletal: no muscle/joint aches Skin: no rashes Neurological: no tremors/numbness/tingling/dizziness  I reviewed pt's medications, allergies, PMH, social hx, family hx, and changes were documented in the history of present illness. Otherwise, unchanged from my initial visit note.  Past Medical History  Diagnosis Date  . Wears glasses   . Wears partial dentures     top and bottom partials  . Chronic back pain   . Arthritis     neck and all over  . Hypertension   . Diabetes mellitus without complication ([place])   . Hyperlipemia   . 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 Cancer ([place]) 1998    hx colon ca-chemo radiation; lung   Past Surgical History  Procedure Laterality Date  . Appendectomy    . Abdominal hysterectomy    . Partial colectomy  1998    cancer-rad/chemo  . Combined  mediastinoscopy and bronchoscopy  2002    biopsy  . Colonoscopy      many  . Lipoma excision    . Dg arthro thumb*r*  2011    ablasion rt thumb nailbed  . Skin full thickness graft Right 07/23/2013    Procedure: NAIL PLATE ABLATION WITH FULL THICKNESS SKIN GRAFT FROM RIGHT   MEDIAL ELBOW TO RIGHT LONG FINGER NAIL BED;  Surgeon: Cammie Sickle., MD;  Location: [facility];  Service: Orthopedics;  Laterality: Right;  . I&d extremity Right 07/23/2013    Procedure: IRRIGATION AND DEBRIDEMENT DIP JOINT RIGHT INDEX FINGER, EXCISE MUCOID CYST RIGHT INDEX FINGER  ;  Surgeon:  Cammie Sickle., MD;  Location: [facility];  Service: Orthopedics;  Laterality: Right;  . Mass excision Right 09/28/2014    Procedure: EXCISION MASS RIGHT MIDDLE FINGER;  Surgeon: Daryll Brod, MD;  Location: [facility];  Service: Orthopedics;  Laterality: Right;  ANESTHESIA:  GENERAL, IV REGIONAL FAB  . Cataract extraction, bilateral     Social History   Social History  . Marital Status: Married    Spouse Name: N/A  . Number of Children: 3   Occupational History  . retired   Social History Main Topics  . Smoking status: Former Smoker -- 0.50 packs/day    Types: Cigarettes    Quit date: 07/22/1971  . Smokeless tobacco: Not on file  . Alcohol Use: No     Comment: rare  . Drug Use: No   Current Outpatient Prescriptions on File Prior to Visit  Medication Sig  . acetaminophen (TYLENOL) 500 MG tablet Take 500 mg by mouth every 6 (six) hours as needed for pain.  . Glipizide XL 10 mg  Take 2x a day  . HYDROcodone-acetaminophen (NORCO) 5-325 MG per tablet Take 1 tablet by mouth every 6 (six) hours as needed for moderate pain.  Marland Kitchen lovastatin (MEVACOR) 10 MG tablet Take 10 mg by mouth at bedtime.  . metFORMIN (GLUCOPHAGE) 1000 MG tablet Take 1,000 mg by mouth 2 (two) times daily with a meal.  . ramipril (ALTACE) 10 MG capsule Take 10 mg by mouth daily.  . ranitidine (ZANTAC) 150 MG tablet Take 150 mg by mouth 2 (two) times daily.  No Known Allergies Family History  Problem Relation Age of Onset  . Cancer Mother     colon   . Hypertension Father   . CVA Father   . Obesity Brother    PE: BP 124/68 mmHg  Pulse 91  Temp(Src) 97.8 F (36.6 C) (Oral)  Resp 12  Wt 155 lb 9.6 oz (70.58 kg)  SpO2 96% Body mass index is 28.45 kg/(m^2). Wt Readings from Last 3 Encounters:  08/23/15 155 lb 9.6 oz (70.58 kg)  07/18/15 153 lb 6.4 oz (69.582 kg)  09/28/14 159 lb 2 oz (72.179 kg)   Constitutional: overweight, in NAD Eyes: PERRLA, EOMI, no exophthalmos ENT:  moist mucous membranes, no thyromegaly, no cervical lymphadenopathy Cardiovascular: RRR, No MRG Respiratory: CTA B Gastrointestinal: abdomen soft, NT, ND, BS+ Musculoskeletal: no deformities, strength intact in all 4 Skin: moist, warm, no rashes, + small healing bruise on abdomen Neurological: no tremor with outstretched hands, DTR normal in all 4  ASSESSMENT: 1. DM2, insulin-dependent, uncontrolled, without complications  PLAN:  1. Patient with long-standing,  fairly well controlled diabetes, on oral antidiabetic regimen and now basal insulin, with much improved control since last visit, to the point of low CBGs in am >>  will decrease Glipizide XL before dinner to 1/2 tablet >> will become instant release >> will help with higher sugars after dinner and reduce the incidence of lows in am. - reviewed HbA1c from last visit >> higher - I suggested to:  Patient Instructions  Please continue: - Metformin ER 1000 mg 2x a day, with meals - Levemir 15 units at bedtime  Please decrease the Glipizide XL before dinner to 1/2 of a tablet (2.5 mg). If you plan to eat a particularly high meal, please take 2x 1/2 tablets (5 mg).  If you still have low blood sugars in am after you decrease Glipizide XL, please decrease the dose of Levemir by 2 units.  Please return in 2 months with your sugar log.   - continue checking sugars at different times of the day - check 1-2 times a day, rotating checks - advised for yearly eye exams >> She has one every year - Return to clinic in 2 mo with sugar log

## 2015-10-24 ENCOUNTER — Ambulatory Visit (INDEPENDENT_AMBULATORY_CARE_PROVIDER_SITE_OTHER): Payer: Medicare Other | Admitting: Internal Medicine

## 2015-10-24 ENCOUNTER — Other Ambulatory Visit (INDEPENDENT_AMBULATORY_CARE_PROVIDER_SITE_OTHER): Payer: Medicare Other | Admitting: *Deleted

## 2015-10-24 ENCOUNTER — Encounter: Payer: Self-pay | Admitting: Internal Medicine

## 2015-10-24 VITALS — BP 118/68 | Temp 97.3°F | Resp 12 | Wt 158.6 lb

## 2015-10-24 DIAGNOSIS — Z794 Long term (current) use of insulin: Secondary | ICD-10-CM | POA: Diagnosis not present

## 2015-10-24 DIAGNOSIS — E119 Type 2 diabetes mellitus without complications: Secondary | ICD-10-CM

## 2015-10-24 LAB — POCT GLYCOSYLATED HEMOGLOBIN (HGB A1C): Hemoglobin A1C: 7.1

## 2015-10-24 NOTE — Patient Instructions (Addendum)
Patient Instructions  Please continue: - Metformin ER 1000 mg 2x a day, with meals - Levemir 15 units at bedtime - Glipizide XL before dinner: 1/2 of a 5 mg tablet (2.5 mg).  Please return in 6 months with your sugar log.

## 2015-10-24 NOTE — Progress Notes (Signed)
Patient ID: Beth [name], female   DOB: 08/23/1941, 75 y.o.   MRN: [id number]  HPI: Beth [name] is a 75 y.o.-year-old female, returning for f/u for DM2, dx in 1990s, insulin-dependent, uncontrolled, without complications. Last visit 2 mo ago.  Last hemoglobin A1c was: Lab Results  Component Value Date   HGBA1C 8.4* 07/18/2015   HGBA1C 6.9* 01/28/2015   Pt is on a regimen of: - Metformin ER 1000 mg 2x a day, with meals - Glipizide XL 2.5 mg before dinner - Levemir 12 >> 15 units at bedtime - started 06/2015 Patient was previously on Januvia and then Victoza. Of note, I received the records from PCP, and the lipase was elevated, at 74 (0-59) on 07/08/2015, then 69 on 07/18/2015. She was on Invokana 100 mg in a.m. >> yeast infection.  Pt checks her sugars 1x a day and they are great: - am: after stopping Victoza: 211-282, on 0.6 mg Victoza >> CBG decreased to 129-140 >> 62-115, 125 >> 85-107, 130 - 2h after b'fast: n/c >> 125-140 >> n/c - before lunch: n/c >> 105-143 >> 117-119 - 2h after lunch: n/c >> 116-134 >> 130 - before dinner: n/c >> 91-122 >> 69-140 - 2h after dinner: n/c >> 148-180 >> 169 - bedtime: n/c >> 134-192, 217 >> 133-170 - nighttime: n/c >> 180, 212 No lows. Lowest sugar was 123 (on Victoza) >> 62; she has hypoglycemia awareness at 70.  Highest sugar was 282 >> 217.  Glucometer: ReliOn  Pt's meals are: - Breakfast: scrambled eggs, toast or cereals or yoghurt - Lunch: crackers and cheese, carrots, apple - Dinner: meat + salad/veggies + starch - Snacks: 2-3: cashews, cheese and carrots, almost milk + graham crackers  - no CKD, last BUN/creatinine:  Lab Results  Component Value Date   BUN 20 07/18/2015   CREATININE 0.87 07/18/2015  On Ramipril. - last set of lipids: Lab Results  Component Value Date   CHOL 151 01/28/2015   HDL 37 01/28/2015   LDLCALC 71 01/28/2015   TRIG 216* 01/28/2015  On Lovastatin. - last eye exam was in 2015. No DR. Dr  Gershon Crane. - no numbness and tingling in her feet. She had a normal foot exam by PCP on 06/22/2015.  She has a history of colon cancer 18 years ago. She remembers that the mass perforated her colon at that time.  ROS: Constitutional: no weight gain/loss, no fatigue, no subjective hyperthermia/hypothermia Eyes: no blurry vision, no xerophthalmia ENT: no sore throat, no nodules palpated in throat, no dysphagia/odynophagia, no hoarseness Cardiovascular: no CP/SOB/palpitations/leg swelling Respiratory: no cough/SOB Gastrointestinal: no N/V/D/C Musculoskeletal: no muscle/joint aches Skin: no rashes Neurological: no tremors/numbness/tingling/dizziness  I reviewed pt's medications, allergies, PMH, social hx, family hx, and changes were documented in the history of present illness. Otherwise, unchanged from my initial visit note.  Past Medical History  Diagnosis Date  . Wears glasses   . Wears partial dentures     top and bottom partials  . Chronic back pain   . Arthritis     neck and all over  . Hypertension   . Diabetes mellitus without complication ([place])   . Hyperlipemia   . 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 Cancer ([place]) 1998    hx colon ca-chemo radiation; lung   Past Surgical History  Procedure Laterality Date  . Appendectomy    . Abdominal hysterectomy    . Partial colectomy  1998    cancer-rad/chemo  .  Combined mediastinoscopy and bronchoscopy  2002    biopsy  . Colonoscopy      many  . Lipoma excision    . Dg arthro thumb*r*  2011    ablasion rt thumb nailbed  . Skin full thickness graft Right 07/23/2013    Procedure: NAIL PLATE ABLATION WITH FULL THICKNESS SKIN GRAFT FROM RIGHT   MEDIAL ELBOW TO RIGHT LONG FINGER NAIL BED;  Surgeon: Cammie Sickle., MD;  Location: [facility];  Service: Orthopedics;  Laterality: Right;  . I&d extremity Right 07/23/2013    Procedure: IRRIGATION AND DEBRIDEMENT DIP JOINT RIGHT INDEX FINGER, EXCISE MUCOID CYST RIGHT  INDEX FINGER  ;  Surgeon: Cammie Sickle., MD;  Location: [facility];  Service: Orthopedics;  Laterality: Right;  . Mass excision Right 09/28/2014    Procedure: EXCISION MASS RIGHT MIDDLE FINGER;  Surgeon: Daryll Brod, MD;  Location: [facility];  Service: Orthopedics;  Laterality: Right;  ANESTHESIA:  GENERAL, IV REGIONAL FAB  . Cataract extraction, bilateral     Social History   Social History  . Marital Status: Married    Spouse Name: N/A  . Number of Children: 3   Occupational History  . retired   Social History Main Topics  . Smoking status: Former Smoker -- 0.50 packs/day    Types: Cigarettes    Quit date: 07/22/1971  . Smokeless tobacco: Not on file  . Alcohol Use: No     Comment: rare  . Drug Use: No   Current Outpatient Prescriptions on File Prior to Visit  Medication Sig  . acetaminophen (TYLENOL) 500 MG tablet Take 500 mg by mouth every 6 (six) hours as needed for pain.  . Glipizide XL 10 mg  Take 2x a day  . HYDROcodone-acetaminophen (NORCO) 5-325 MG per tablet Take 1 tablet by mouth every 6 (six) hours as needed for moderate pain.  Marland Kitchen lovastatin (MEVACOR) 10 MG tablet Take 10 mg by mouth at bedtime.  . metFORMIN (GLUCOPHAGE) 1000 MG tablet Take 1,000 mg by mouth 2 (two) times daily with a meal.  . ramipril (ALTACE) 10 MG capsule Take 10 mg by mouth daily.  . ranitidine (ZANTAC) 150 MG tablet Take 150 mg by mouth 2 (two) times daily.  No Known Allergies Family History  Problem Relation Age of Onset  . Cancer Mother     colon   . Hypertension Father   . CVA Father   . Obesity Brother    PE: BP 118/68 mmHg  Temp(Src) 97.3 F (36.3 C) (Oral)  Resp 12  Wt 158 lb 9.6 oz (71.94 kg) Body mass index is 29 kg/(m^2). Wt Readings from Last 3 Encounters:  10/24/15 158 lb 9.6 oz (71.94 kg)  08/23/15 155 lb 9.6 oz (70.58 kg)  07/18/15 153 lb 6.4 oz (69.582 kg)   Constitutional: overweight, in NAD Eyes: PERRLA, EOMI, no  exophthalmos ENT: moist mucous membranes, no thyromegaly, no cervical lymphadenopathy Cardiovascular: RRR, No MRG Respiratory: CTA B Gastrointestinal: abdomen soft, NT, ND, BS+ Musculoskeletal: no deformities, strength intact in all 4 Skin: moist, warm, no rashes Neurological: no tremor with outstretched hands, DTR normal in all 4  ASSESSMENT: 1. DM2, insulin-dependent, uncontrolled, without complications  PLAN:  1. Patient with long-standing,  fairly well controlled diabetes, on oral antidiabetic regimen and now basal insulin, with much improved control since last HbA1c, when HbA1c was 8.4% >> now 7.1% (at goal for her). - I suggested to:  Patient Instructions  Please continue: - Metformin ER 1000 mg 2x a day, with meals - Levemir 15 units at bedtime - Glipizide XL before dinner 1/2 of a 5 mg tablet (2.5 mg).   Please return in 6 months with your sugar log.   - continue checking sugars at different times of the day - check 1once times a day, rotating checks - advised for yearly eye exams >> She has one every year >> UTD - Return to clinic in 6 mo, per her request, with sugar log

## 2015-12-30 DIAGNOSIS — E782 Mixed hyperlipidemia: Secondary | ICD-10-CM | POA: Diagnosis not present

## 2015-12-30 DIAGNOSIS — E118 Type 2 diabetes mellitus with unspecified complications: Secondary | ICD-10-CM | POA: Diagnosis not present

## 2015-12-30 DIAGNOSIS — Z0000 Encounter for general adult medical examination without abnormal findings: Secondary | ICD-10-CM | POA: Diagnosis not present

## 2015-12-30 DIAGNOSIS — Z85038 Personal history of other malignant neoplasm of large intestine: Secondary | ICD-10-CM | POA: Diagnosis not present

## 2015-12-30 DIAGNOSIS — R809 Proteinuria, unspecified: Secondary | ICD-10-CM | POA: Diagnosis not present

## 2015-12-30 DIAGNOSIS — N189 Chronic kidney disease, unspecified: Secondary | ICD-10-CM | POA: Diagnosis not present

## 2015-12-30 DIAGNOSIS — I10 Essential (primary) hypertension: Secondary | ICD-10-CM | POA: Diagnosis not present

## 2015-12-30 DIAGNOSIS — Z13820 Encounter for screening for osteoporosis: Secondary | ICD-10-CM | POA: Diagnosis not present

## 2016-01-02 DIAGNOSIS — E782 Mixed hyperlipidemia: Secondary | ICD-10-CM | POA: Diagnosis not present

## 2016-01-02 DIAGNOSIS — N189 Chronic kidney disease, unspecified: Secondary | ICD-10-CM | POA: Diagnosis not present

## 2016-01-02 DIAGNOSIS — Z7984 Long term (current) use of oral hypoglycemic drugs: Secondary | ICD-10-CM | POA: Diagnosis not present

## 2016-01-02 DIAGNOSIS — I10 Essential (primary) hypertension: Secondary | ICD-10-CM | POA: Diagnosis not present

## 2016-01-02 DIAGNOSIS — Z85038 Personal history of other malignant neoplasm of large intestine: Secondary | ICD-10-CM | POA: Diagnosis not present

## 2016-01-02 DIAGNOSIS — Z13820 Encounter for screening for osteoporosis: Secondary | ICD-10-CM | POA: Diagnosis not present

## 2016-01-02 DIAGNOSIS — Z0000 Encounter for general adult medical examination without abnormal findings: Secondary | ICD-10-CM | POA: Diagnosis not present

## 2016-01-02 DIAGNOSIS — R8290 Unspecified abnormal findings in urine: Secondary | ICD-10-CM | POA: Diagnosis not present

## 2016-01-02 DIAGNOSIS — E118 Type 2 diabetes mellitus with unspecified complications: Secondary | ICD-10-CM | POA: Diagnosis not present

## 2016-01-02 DIAGNOSIS — Z794 Long term (current) use of insulin: Secondary | ICD-10-CM | POA: Diagnosis not present

## 2016-01-16 ENCOUNTER — Other Ambulatory Visit: Payer: Self-pay | Admitting: Internal Medicine

## 2016-01-25 DIAGNOSIS — E782 Mixed hyperlipidemia: Secondary | ICD-10-CM | POA: Diagnosis not present

## 2016-01-25 DIAGNOSIS — Z0000 Encounter for general adult medical examination without abnormal findings: Secondary | ICD-10-CM | POA: Diagnosis not present

## 2016-01-25 DIAGNOSIS — Z13820 Encounter for screening for osteoporosis: Secondary | ICD-10-CM | POA: Diagnosis not present

## 2016-01-25 DIAGNOSIS — I10 Essential (primary) hypertension: Secondary | ICD-10-CM | POA: Diagnosis not present

## 2016-01-25 DIAGNOSIS — N189 Chronic kidney disease, unspecified: Secondary | ICD-10-CM | POA: Diagnosis not present

## 2016-01-25 DIAGNOSIS — Z85038 Personal history of other malignant neoplasm of large intestine: Secondary | ICD-10-CM | POA: Diagnosis not present

## 2016-01-25 DIAGNOSIS — E118 Type 2 diabetes mellitus with unspecified complications: Secondary | ICD-10-CM | POA: Diagnosis not present

## 2016-01-25 DIAGNOSIS — R8290 Unspecified abnormal findings in urine: Secondary | ICD-10-CM | POA: Diagnosis not present

## 2016-01-25 DIAGNOSIS — Z7984 Long term (current) use of oral hypoglycemic drugs: Secondary | ICD-10-CM | POA: Diagnosis not present

## 2016-01-25 DIAGNOSIS — R899 Unspecified abnormal finding in specimens from other organs, systems and tissues: Secondary | ICD-10-CM | POA: Diagnosis not present

## 2016-01-26 ENCOUNTER — Telehealth: Payer: Self-pay | Admitting: Internal Medicine

## 2016-01-26 ENCOUNTER — Encounter: Payer: Self-pay | Admitting: Internal Medicine

## 2016-01-26 DIAGNOSIS — E2839 Other primary ovarian failure: Secondary | ICD-10-CM | POA: Diagnosis not present

## 2016-01-26 DIAGNOSIS — M8589 Other specified disorders of bone density and structure, multiple sites: Secondary | ICD-10-CM | POA: Diagnosis not present

## 2016-01-26 NOTE — Telephone Encounter (Signed)
Yes, we should refill them: - Metformin ER 500 mg x 2 twice day, with meals - Glipizide XL before dinner: 1/2 of a 5 mg tablet (2.5 mg).  Please also check with her to see what Qs she has.

## 2016-01-26 NOTE — Telephone Encounter (Signed)
Please advise 

## 2016-01-26 NOTE — Telephone Encounter (Signed)
PT requests that Dr. Cruzita Lederer writes her prescriptions for Metformin and Glipizide.  She said her regular doctor had been writing them but now she wants them coming from here.  She would like a call back to discuss.

## 2016-01-26 NOTE — Telephone Encounter (Signed)
I contacted the pt. She stated at this time she is not out of any medications. She will wait until her next appointment to request the refills she needs.

## 2016-01-26 NOTE — Progress Notes (Signed)
Received labs from PCP (Dr. Hulan Fess) from 01/25/2016: HbA1c 7.0%, glucose 73

## 2016-02-13 ENCOUNTER — Encounter: Payer: Self-pay | Admitting: *Deleted

## 2016-02-13 DIAGNOSIS — E119 Type 2 diabetes mellitus without complications: Secondary | ICD-10-CM | POA: Diagnosis not present

## 2016-02-13 DIAGNOSIS — Z794 Long term (current) use of insulin: Secondary | ICD-10-CM | POA: Diagnosis not present

## 2016-02-13 DIAGNOSIS — Z961 Presence of intraocular lens: Secondary | ICD-10-CM | POA: Diagnosis not present

## 2016-02-13 LAB — HM DIABETES EYE EXAM

## 2016-03-15 DIAGNOSIS — H0013 Chalazion right eye, unspecified eyelid: Secondary | ICD-10-CM | POA: Diagnosis not present

## 2016-03-22 DIAGNOSIS — H0012 Chalazion right lower eyelid: Secondary | ICD-10-CM | POA: Diagnosis not present

## 2016-04-02 ENCOUNTER — Other Ambulatory Visit: Payer: Self-pay | Admitting: Family Medicine

## 2016-04-02 DIAGNOSIS — Z1231 Encounter for screening mammogram for malignant neoplasm of breast: Secondary | ICD-10-CM

## 2016-04-13 ENCOUNTER — Ambulatory Visit (INDEPENDENT_AMBULATORY_CARE_PROVIDER_SITE_OTHER): Payer: Medicare Other | Admitting: Internal Medicine

## 2016-04-13 ENCOUNTER — Encounter: Payer: Self-pay | Admitting: Internal Medicine

## 2016-04-13 DIAGNOSIS — E1165 Type 2 diabetes mellitus with hyperglycemia: Secondary | ICD-10-CM

## 2016-04-13 DIAGNOSIS — E11649 Type 2 diabetes mellitus with hypoglycemia without coma: Secondary | ICD-10-CM | POA: Insufficient documentation

## 2016-04-13 LAB — POCT GLYCOSYLATED HEMOGLOBIN (HGB A1C): HEMOGLOBIN A1C: 6.9

## 2016-04-13 MED ORDER — RELION PEN NEEDLES 32G X 4 MM MISC
Status: DC
Start: 2016-04-13 — End: 2017-08-11

## 2016-04-13 MED ORDER — METFORMIN HCL 1000 MG PO TABS: 1000.0000 mg | ORAL_TABLET | Freq: Two times a day (BID) | ORAL | Status: DC

## 2016-04-13 MED ORDER — GLIPIZIDE ER 10 MG PO TB24: ORAL_TABLET | ORAL | Status: DC

## 2016-04-13 NOTE — Addendum Note (Signed)
Addended by: Caprice Beaver T on: 04/13/2016 10:46 AM   Modules accepted: Orders

## 2016-04-13 NOTE — Progress Notes (Signed)
Patient ID: Beth [name], female   DOB: 1941-07-16, 75 y.o.   MRN: [id number]  HPI: Beth [name] is a 75 y.o.-year-old female, returning for f/u for DM2, dx in 1990s, insulin-dependent, uncontrolled, without complications. Last visit 6 mo ago.  Last hemoglobin A1c was: Received labs from PCP (Dr. Hulan Fess) from 01/25/2016: HbA1c 7.0%, glucose 73 Lab Results  Component Value Date   HGBA1C 7.1 10/24/2015   HGBA1C 8.4* 07/18/2015   HGBA1C 6.9* 01/28/2015   Pt is on a regimen of: - Metformin ER 1000 mg 2x a day, with meals - Glipizide XL (1/2-1x of the 10 mg) 2x a day (in am, if takes 10 mg, she does not cut the tab, but in pm, if she takes 10 mg, she does cut the tab - Levemir 15 units at bedtime - started 06/2015 Patient was previously on Januvia and then Victoza. Of note, I received the records from PCP, and the lipase was elevated, at 74 (0-59) on 07/08/2015, then 69 on 07/18/2015. She was on Invokana 100 mg in a.m. >> yeast infection.  Pt checks her sugars 1x a day and they are great: - am: after stopping Victoza: 211-282, on 0.6 mg Victoza >> CBG decreased to 129-140 >> 62-115, 125 >> 85-107, 130 >> 91-121 - 2h after b'fast: n/c >> 125-140 >> n/c >> 132-179 - before lunch: n/c >> 105-143 >> 117-119 >> 92-140 - 2h after lunch: n/c >> 116-134 >> 130 >> 135-180 - before dinner: n/c >> 91-122 >> 69-140 >> 116-140 - 2h after dinner: n/c >> 148-180 >> 169 >> 165-224, 240 - bedtime: n/c >> 134-192, 217 >> 133-170 >> n/c - nighttime: n/c >> 180, 212 >> n/c No lows. Lowest sugar was 123 (on Victoza) >> 62 >> 70s; she has hypoglycemia awareness at 70.  Highest sugar was 282 >> 217 >> 240.  Glucometer: ReliOn  Pt's meals are: - Breakfast: scrambled eggs, toast or cereals or yoghurt - Lunch: crackers and cheese, carrots, apple - Dinner: meat + salad/veggies + starch - Snacks: 2-3: cashews, cheese and carrots, almost milk + graham crackers  - no CKD, last BUN/creatinine:  Lab  Results  Component Value Date   BUN 20 07/18/2015   CREATININE 0.87 07/18/2015  On Ramipril. - last set of lipids: Lab Results  Component Value Date   CHOL 151 01/28/2015   HDL 37 01/28/2015   LDLCALC 71 01/28/2015   TRIG 216* 01/28/2015  On Lovastatin. - last eye exam was in 02/13/2016. No DR. Dr Gershon Crane. - no numbness and tingling in her feet. She had a normal foot exam by PCP on 06/22/2015.  She has a history of colon cancer 18 years ago. She remembers that the mass perforated her colon at that time.  ROS: Constitutional: no weight gain/loss, no fatigue, no subjective hyperthermia/hypothermia Eyes: no blurry vision, no xerophthalmia ENT: no sore throat, no nodules palpated in throat, no dysphagia/odynophagia, no hoarseness Cardiovascular: no CP/SOB/palpitations/leg swelling Respiratory: no cough/SOB Gastrointestinal: no N/V/D/C Musculoskeletal: no muscle/joint aches Skin: no rashes Neurological: no tremors/numbness/tingling/dizziness  I reviewed pt's medications, allergies, PMH, social hx, family hx, and changes were documented in the history of present illness. Otherwise, unchanged from my initial visit note.  Past Medical History  Diagnosis Date  . Wears glasses   . Wears partial dentures     top and bottom partials  . Chronic back pain   . Arthritis     neck and all over  . Hypertension   .  Diabetes mellitus without complication ([place])   . Hyperlipemia   . 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 Cancer ([place]) 1998    hx colon ca-chemo radiation; lung   Past Surgical History  Procedure Laterality Date  . Appendectomy    . Abdominal hysterectomy    . Partial colectomy  1998    cancer-rad/chemo  . Combined mediastinoscopy and bronchoscopy  2002    biopsy  . Colonoscopy      many  . Lipoma excision    . Dg arthro thumb*r*  2011    ablasion rt thumb nailbed  . Skin full thickness graft Right 07/23/2013    Procedure: NAIL PLATE ABLATION WITH FULL THICKNESS SKIN  GRAFT FROM RIGHT   MEDIAL ELBOW TO RIGHT LONG FINGER NAIL BED;  Surgeon: Cammie Sickle., MD;  Location: [facility];  Service: Orthopedics;  Laterality: Right;  . I&d extremity Right 07/23/2013    Procedure: IRRIGATION AND DEBRIDEMENT DIP JOINT RIGHT INDEX FINGER, EXCISE MUCOID CYST RIGHT INDEX FINGER  ;  Surgeon: Cammie Sickle., MD;  Location: [facility];  Service: Orthopedics;  Laterality: Right;  . Mass excision Right 09/28/2014    Procedure: EXCISION MASS RIGHT MIDDLE FINGER;  Surgeon: Daryll Brod, MD;  Location: [facility];  Service: Orthopedics;  Laterality: Right;  ANESTHESIA:  GENERAL, IV REGIONAL FAB  . Cataract extraction, bilateral     Social History   Social History  . Marital Status: Married    Spouse Name: N/A  . Number of Children: 3   Occupational History  . retired   Social History Main Topics  . Smoking status: Former Smoker -- 0.50 packs/day    Types: Cigarettes    Quit date: 07/22/1971  . Smokeless tobacco: Not on file  . Alcohol Use: No     Comment: rare  . Drug Use: No   Current Outpatient Prescriptions on File Prior to Visit  Medication Sig  . acetaminophen (TYLENOL) 500 MG tablet Take 500 mg by mouth every 6 (six) hours as needed for pain.  . Glipizide XL 10 mg  Take 2x a day  . HYDROcodone-acetaminophen (NORCO) 5-325 MG per tablet Take 1 tablet by mouth every 6 (six) hours as needed for moderate pain.  Marland Kitchen lovastatin (MEVACOR) 10 MG tablet Take 10 mg by mouth at bedtime.  . metFORMIN (GLUCOPHAGE) 1000 MG tablet Take 1,000 mg by mouth 2 (two) times daily with a meal.  . ramipril (ALTACE) 10 MG capsule Take 10 mg by mouth daily.  . ranitidine (ZANTAC) 150 MG tablet Take 150 mg by mouth 2 (two) times daily.  No Known Allergies Family History  Problem Relation Age of Onset  . Cancer Mother     colon   . Hypertension Father   . CVA Father   . Obesity Brother    PE: BP 140/70 mmHg  Pulse 76  Ht '5\' 3"'$   (1.6 m)  Wt 163 lb (73.936 kg)  BMI 28.88 kg/m2  SpO2 93% Body mass index is 28.88 kg/(m^2). Wt Readings from Last 3 Encounters:  04/13/16 163 lb (73.936 kg)  10/24/15 158 lb 9.6 oz (71.94 kg)  08/23/15 155 lb 9.6 oz (70.58 kg)   Constitutional: overweight, in NAD Eyes: PERRLA, EOMI, no exophthalmos ENT: moist mucous membranes, no thyromegaly, no cervical lymphadenopathy Cardiovascular: RRR, No MRG Respiratory: CTA B Gastrointestinal: abdomen soft, NT, ND, BS+ Musculoskeletal: no deformities, strength intact in all 4 Skin: moist,  warm, no rashes Neurological: no tremor with outstretched hands, DTR normal in all 4  ASSESSMENT: 1. DM2, insulin-dependent, uncontrolled, without complications  PLAN:  1. Patient with long-standing,  fairly well controlled diabetes, on oral antidiabetic regimen and now basal insulin, with much improved control >> recent HbA1c from PCP: 7.0% (at goal for her). Today: 6.9%! (checked at 2.5 mo per pt's preference). - she has increase the Glipizide ER dose from last visit, but w/o lows, so we can continue with the higher dose. She has higher sugars at bedtime >> takes occasionally 2 x 1/2 of the 10 mg Glipizide tabs. She has a good understanding of how the ER and regular Glipizide tabs work >> is proficient in adjusting the dose by herself. Will not change her regimen but advised her to let me know if she has any lows. - I suggested to:  Patient Instructions  Please continue: - Metformin ER 1000 mg 2x a day, with meals - Levemir 15 units at bedtime - Glipizide XL before dinner 1/2 of a 5 mg tablet (2.5 mg).   Please return in 6 months with your sugar log.   - continue checking sugars at different times of the day - check 1 once times a day, rotating checks - advised for yearly eye exams >> She has one every year >> UTD - will need a Lipid level >> will ask for records from PCP - Return to clinic in 6 mo, per her request, with sugar log

## 2016-04-13 NOTE — Patient Instructions (Addendum)
Please continue: - Metformin ER 1000 mg 2x a day, with meals - Levemir 15 units at bedtime - Glipizide XL 5-10 mg before b'fast and dinner.   Please return in 6 months with your sugar log.   KEEP UP THE GOOD WORK!

## 2016-04-26 ENCOUNTER — Ambulatory Visit
Admission: RE | Admit: 2016-04-26 | Discharge: 2016-04-26 | Disposition: A | Discharge status: Home / Self Care | Payer: Medicare Other | Source: Ambulatory Visit | Attending: Family Medicine | Admitting: Family Medicine

## 2016-04-26 DIAGNOSIS — Z1231 Encounter for screening mammogram for malignant neoplasm of breast: Secondary | ICD-10-CM | POA: Diagnosis not present

## 2016-06-13 DIAGNOSIS — Z23 Encounter for immunization: Secondary | ICD-10-CM | POA: Diagnosis not present

## 2016-07-30 DIAGNOSIS — L608 Other nail disorders: Secondary | ICD-10-CM | POA: Insufficient documentation

## 2016-07-31 ENCOUNTER — Other Ambulatory Visit: Payer: Self-pay | Admitting: Orthopedic Surgery

## 2016-08-03 ENCOUNTER — Ambulatory Visit (INDEPENDENT_AMBULATORY_CARE_PROVIDER_SITE_OTHER): Payer: Medicare Other | Admitting: Podiatry

## 2016-08-03 ENCOUNTER — Encounter: Payer: Self-pay | Admitting: Podiatry

## 2016-08-03 VITALS — BP 159/81 | HR 89 | Resp 16 | Ht 62.0 in | Wt 159.0 lb

## 2016-08-03 DIAGNOSIS — L600 Ingrowing nail: Secondary | ICD-10-CM | POA: Diagnosis not present

## 2016-08-03 NOTE — Patient Instructions (Signed)

## 2016-08-04 NOTE — Progress Notes (Signed)
Subjective:     Patient ID: Beth [name], female   DOB: 03-29-1941, 75 y.o.   MRN: [id number]  HPI patient states I have a very painful damaged left fifth nail but I like removed and also I get pain in some of my other nails secondary to ingrown type component   Review of Systems  All other systems reviewed and are negative.      Objective:   Physical Exam  Constitutional: She is oriented to person, place, and time.  Cardiovascular: Intact distal pulses.   Musculoskeletal: Normal range of motion.  Neurological: She is oriented to person, place, and time.  Skin: Skin is warm.  Nursing note and vitals reviewed.  neurovascular status intact muscle strength adequate range of motion within normal limits with patient noted to have a damaged left fifth nail that's thick and incurvated and is loose. States she traumatized several months ago and also has several of the nails they get sore in the corners and states that her sugar has been running very well with her last A1c being 7. good digital perfusion and well oriented     Assessment:     Damage fifth nail left with thickness incurvation and pain and ingrown's of remaining nails with well controlled diabetes    Plan:     H&P conditions reviewed treatment options discussed. Patient wants nail removal and I explained risk associated with this including nonhealing secondary to diabetes and she is willing to accept risk. Today I infiltrated the left fifth digit 60 mg Xylocaine Marcaine mixture remove the nail exposed matrix and applied phenol 3 applications 30 seconds followed by alcohol lavage and sterile dressing. Gave instructions on soaks and reappoint and debrided remaining nails

## 2016-08-15 ENCOUNTER — Telehealth: Payer: Self-pay | Admitting: *Deleted

## 2016-08-15 NOTE — Telephone Encounter (Signed)
Called patient at [phone] (Home #) to check to see how they were doing from when they had their nail removed on Friday, August 03, 2016. Pt stated, "Doing wonderful! Not in any pain".

## 2016-08-23 ENCOUNTER — Encounter (HOSPITAL_BASED_OUTPATIENT_CLINIC_OR_DEPARTMENT_OTHER): Payer: Self-pay | Admitting: *Deleted

## 2016-08-24 ENCOUNTER — Encounter (HOSPITAL_BASED_OUTPATIENT_CLINIC_OR_DEPARTMENT_OTHER)
Admission: RE | Admit: 2016-08-24 | Discharge: 2016-08-24 | Disposition: A | Discharge status: Home / Self Care | Payer: Medicare Other | Source: Ambulatory Visit | Attending: Orthopedic Surgery | Admitting: Orthopedic Surgery

## 2016-08-24 ENCOUNTER — Other Ambulatory Visit: Payer: Self-pay

## 2016-08-24 DIAGNOSIS — L409 Psoriasis, unspecified: Secondary | ICD-10-CM | POA: Insufficient documentation

## 2016-08-24 DIAGNOSIS — Z01818 Encounter for other preprocedural examination: Secondary | ICD-10-CM | POA: Insufficient documentation

## 2016-08-24 DIAGNOSIS — Z01812 Encounter for preprocedural laboratory examination: Secondary | ICD-10-CM | POA: Insufficient documentation

## 2016-08-24 LAB — BASIC METABOLIC PANEL
ANION GAP: 8 (ref 5–15)
BUN: 11 mg/dL (ref 6–20)
CHLORIDE: 106 mmol/L (ref 101–111)
CO2: 25 mmol/L (ref 22–32)
Calcium: 9.4 mg/dL (ref 8.9–10.3)
Creatinine, Ser: 0.89 mg/dL (ref 0.44–1.00)
Glucose, Bld: 105 mg/dL — ABNORMAL HIGH (ref 65–99)
POTASSIUM: 4.8 mmol/L (ref 3.5–5.1)
SODIUM: 139 mmol/L (ref 135–145)

## 2016-08-27 ENCOUNTER — Other Ambulatory Visit: Payer: Self-pay | Admitting: Orthopedic Surgery

## 2016-08-27 DIAGNOSIS — L608 Other nail disorders: Secondary | ICD-10-CM | POA: Diagnosis not present

## 2016-08-28 ENCOUNTER — Ambulatory Visit (HOSPITAL_BASED_OUTPATIENT_CLINIC_OR_DEPARTMENT_OTHER): Payer: Medicare Other | Admitting: Certified Registered"

## 2016-08-28 ENCOUNTER — Encounter (HOSPITAL_BASED_OUTPATIENT_CLINIC_OR_DEPARTMENT_OTHER): Payer: Self-pay | Admitting: Certified Registered"

## 2016-08-28 ENCOUNTER — Encounter (HOSPITAL_BASED_OUTPATIENT_CLINIC_OR_DEPARTMENT_OTHER)
Admission: RE | Disposition: A | Discharge status: Home / Self Care | Payer: Self-pay | Source: Ambulatory Visit | Attending: Orthopedic Surgery

## 2016-08-28 ENCOUNTER — Ambulatory Visit (HOSPITAL_BASED_OUTPATIENT_CLINIC_OR_DEPARTMENT_OTHER)
Admission: RE | Admit: 2016-08-28 | Discharge: 2016-08-28 | Disposition: A | Discharge status: Home / Self Care | Payer: Medicare Other | Source: Ambulatory Visit | Attending: Orthopedic Surgery | Admitting: Orthopedic Surgery

## 2016-08-28 DIAGNOSIS — I10 Essential (primary) hypertension: Secondary | ICD-10-CM | POA: Insufficient documentation

## 2016-08-28 DIAGNOSIS — Z833 Family history of diabetes mellitus: Secondary | ICD-10-CM | POA: Diagnosis not present

## 2016-08-28 DIAGNOSIS — L4050 Arthropathic psoriasis, unspecified: Secondary | ICD-10-CM | POA: Diagnosis not present

## 2016-08-28 DIAGNOSIS — L409 Psoriasis, unspecified: Secondary | ICD-10-CM | POA: Diagnosis not present

## 2016-08-28 DIAGNOSIS — Z79899 Other long term (current) drug therapy: Secondary | ICD-10-CM | POA: Insufficient documentation

## 2016-08-28 DIAGNOSIS — L988 Other specified disorders of the skin and subcutaneous tissue: Secondary | ICD-10-CM | POA: Diagnosis not present

## 2016-08-28 DIAGNOSIS — Z87891 Personal history of nicotine dependence: Secondary | ICD-10-CM | POA: Diagnosis not present

## 2016-08-28 DIAGNOSIS — M549 Dorsalgia, unspecified: Secondary | ICD-10-CM | POA: Insufficient documentation

## 2016-08-28 DIAGNOSIS — Z7984 Long term (current) use of oral hypoglycemic drugs: Secondary | ICD-10-CM | POA: Diagnosis not present

## 2016-08-28 DIAGNOSIS — E785 Hyperlipidemia, unspecified: Secondary | ICD-10-CM | POA: Insufficient documentation

## 2016-08-28 DIAGNOSIS — E119 Type 2 diabetes mellitus without complications: Secondary | ICD-10-CM | POA: Insufficient documentation

## 2016-08-28 DIAGNOSIS — Z9221 Personal history of antineoplastic chemotherapy: Secondary | ICD-10-CM | POA: Diagnosis not present

## 2016-08-28 DIAGNOSIS — L408 Other psoriasis: Secondary | ICD-10-CM | POA: Diagnosis not present

## 2016-08-28 DIAGNOSIS — Z85038 Personal history of other malignant neoplasm of large intestine: Secondary | ICD-10-CM | POA: Insufficient documentation

## 2016-08-28 DIAGNOSIS — G8929 Other chronic pain: Secondary | ICD-10-CM | POA: Diagnosis not present

## 2016-08-28 DIAGNOSIS — E1165 Type 2 diabetes mellitus with hyperglycemia: Secondary | ICD-10-CM | POA: Diagnosis not present

## 2016-08-28 DIAGNOSIS — L608 Other nail disorders: Secondary | ICD-10-CM | POA: Diagnosis not present

## 2016-08-28 DIAGNOSIS — M1990 Unspecified osteoarthritis, unspecified site: Secondary | ICD-10-CM | POA: Diagnosis not present

## 2016-08-28 DIAGNOSIS — M79645 Pain in left finger(s): Secondary | ICD-10-CM | POA: Diagnosis not present

## 2016-08-28 HISTORY — PX: SKIN FULL THICKNESS GRAFT: SHX442

## 2016-08-28 HISTORY — PX: NAILBED REPAIR: SHX5028

## 2016-08-28 LAB — GLUCOSE, CAPILLARY
Glucose-Capillary: 129 mg/dL — ABNORMAL HIGH (ref 65–99)
Glucose-Capillary: 131 mg/dL — ABNORMAL HIGH (ref 65–99)

## 2016-08-28 SURGERY — REPAIR, NAIL BED
Anesthesia: General | Site: Hand | Laterality: Left

## 2016-08-28 MED ORDER — ONDANSETRON HCL 4 MG/2ML IJ SOLN
INTRAMUSCULAR | Status: AC
  Filled 2016-08-28: qty 2

## 2016-08-28 MED ORDER — ONDANSETRON HCL 4 MG/2ML IJ SOLN
INTRAMUSCULAR | Status: DC | PRN
  Administered 2016-08-28: 4 mg via INTRAVENOUS

## 2016-08-28 MED ORDER — CEFAZOLIN SODIUM-DEXTROSE 2-4 GM/100ML-% IV SOLN
INTRAVENOUS | Status: AC
  Filled 2016-08-28: qty 100

## 2016-08-28 MED ORDER — CHLORHEXIDINE GLUCONATE 4 % EX LIQD: 60.0000 mL | Freq: Once | CUTANEOUS | Status: DC

## 2016-08-28 MED ORDER — CEFAZOLIN SODIUM-DEXTROSE 2-4 GM/100ML-% IV SOLN
2.0000 g | INTRAVENOUS | Status: AC
  Administered 2016-08-28: 2 g via INTRAVENOUS

## 2016-08-28 MED ORDER — SCOPOLAMINE 1 MG/3DAYS TD PT72: 1.0000 | MEDICATED_PATCH | Freq: Once | TRANSDERMAL | Status: DC | PRN

## 2016-08-28 MED ORDER — LACTATED RINGERS IV SOLN: INTRAVENOUS | Status: DC

## 2016-08-28 MED ORDER — BUPIVACAINE HCL (PF) 0.25 % IJ SOLN
INTRAMUSCULAR | Status: DC | PRN
  Administered 2016-08-28: 10 mL

## 2016-08-28 MED ORDER — DEXAMETHASONE SODIUM PHOSPHATE 10 MG/ML IJ SOLN
INTRAMUSCULAR | Status: AC
  Filled 2016-08-28: qty 1

## 2016-08-28 MED ORDER — PROPOFOL 10 MG/ML IV BOLUS
INTRAVENOUS | Status: DC | PRN
  Administered 2016-08-28: 100 mg via INTRAVENOUS

## 2016-08-28 MED ORDER — HYDROCODONE-ACETAMINOPHEN 5-325 MG PO TABS: 1.0000 | ORAL_TABLET | Freq: Four times a day (QID) | ORAL | 0 refills | Status: DC | PRN

## 2016-08-28 MED ORDER — FENTANYL CITRATE (PF) 100 MCG/2ML IJ SOLN
50.0000 ug | INTRAMUSCULAR | Status: DC | PRN
  Administered 2016-08-28: 50 ug via INTRAVENOUS

## 2016-08-28 MED ORDER — LIDOCAINE 2% (20 MG/ML) 5 ML SYRINGE
INTRAMUSCULAR | Status: AC
  Filled 2016-08-28: qty 5

## 2016-08-28 MED ORDER — BUPIVACAINE HCL (PF) 0.25 % IJ SOLN
INTRAMUSCULAR | Status: AC
  Filled 2016-08-28: qty 30

## 2016-08-28 MED ORDER — LIDOCAINE HCL (CARDIAC) 20 MG/ML IV SOLN
INTRAVENOUS | Status: DC | PRN
  Administered 2016-08-28: 60 mg via INTRAVENOUS

## 2016-08-28 MED ORDER — OXYCODONE HCL 5 MG PO TABS: 5.0000 mg | ORAL_TABLET | Freq: Once | ORAL | Status: DC | PRN

## 2016-08-28 MED ORDER — FENTANYL CITRATE (PF) 100 MCG/2ML IJ SOLN: 25.0000 ug | INTRAMUSCULAR | Status: DC | PRN

## 2016-08-28 MED ORDER — LACTATED RINGERS IV SOLN
INTRAVENOUS | Status: DC
  Administered 2016-08-28: 08:00:00 via INTRAVENOUS

## 2016-08-28 MED ORDER — FENTANYL CITRATE (PF) 100 MCG/2ML IJ SOLN
INTRAMUSCULAR | Status: AC
  Filled 2016-08-28: qty 2

## 2016-08-28 MED ORDER — CEFAZOLIN SODIUM-DEXTROSE 2-4 GM/100ML-% IV SOLN: 2.0000 g | INTRAVENOUS | Status: DC

## 2016-08-28 MED ORDER — DEXAMETHASONE SODIUM PHOSPHATE 10 MG/ML IJ SOLN
INTRAMUSCULAR | Status: DC | PRN
  Administered 2016-08-28: 4 mg via INTRAVENOUS

## 2016-08-28 MED ORDER — MIDAZOLAM HCL 2 MG/2ML IJ SOLN: 1.0000 mg | INTRAMUSCULAR | Status: DC | PRN

## 2016-08-28 MED ORDER — OXYCODONE HCL 5 MG/5ML PO SOLN: 5.0000 mg | Freq: Once | ORAL | Status: DC | PRN

## 2016-08-28 MED ORDER — PROMETHAZINE HCL 25 MG/ML IJ SOLN: 6.2500 mg | INTRAMUSCULAR | Status: DC | PRN

## 2016-08-28 SURGICAL SUPPLY — 56 items
APL SKNCLS STERI-STRIP NONHPOA (GAUZE/BANDAGES/DRESSINGS) ×4
BENZOIN TINCTURE PRP APPL 2/3 (GAUZE/BANDAGES/DRESSINGS) ×2 IMPLANT
BLADE MINI RND TIP GREEN BEAV (BLADE) ×1 IMPLANT
BLADE SURG 15 STRL LF DISP TIS (BLADE) ×2 IMPLANT
BLADE SURG 15 STRL SS (BLADE) ×3
BNDG CMPR 9X4 STRL LF SNTH (GAUZE/BANDAGES/DRESSINGS) ×2
BNDG COHESIVE 1X5 TAN STRL LF (GAUZE/BANDAGES/DRESSINGS) ×2 IMPLANT
BNDG COHESIVE 3X5 TAN STRL LF (GAUZE/BANDAGES/DRESSINGS) ×1 IMPLANT
BNDG ESMARK 4X9 LF (GAUZE/BANDAGES/DRESSINGS) ×3 IMPLANT
BNDG GAUZE ELAST 4 BULKY (GAUZE/BANDAGES/DRESSINGS) ×1 IMPLANT
CHLORAPREP W/TINT 26ML (MISCELLANEOUS) ×3 IMPLANT
CORDS BIPOLAR (ELECTRODE) ×3 IMPLANT
COVER BACK TABLE 60X90IN (DRAPES) ×3 IMPLANT
COVER MAYO STAND STRL (DRAPES) ×3 IMPLANT
CUFF TOURNIQUET SINGLE 18IN (TOURNIQUET CUFF) ×1 IMPLANT
DEPRESSOR TONGUE BLADE STERILE (MISCELLANEOUS) IMPLANT
DRAPE EXTREMITY T 121X128X90 (DRAPE) ×3 IMPLANT
DRAPE SURG 17X23 STRL (DRAPES) ×3 IMPLANT
GAUZE SPONGE 4X4 12PLY STRL (GAUZE/BANDAGES/DRESSINGS) ×3 IMPLANT
GAUZE XEROFORM 1X8 LF (GAUZE/BANDAGES/DRESSINGS) ×3 IMPLANT
GLOVE BIOGEL PI IND STRL 7.0 (GLOVE) IMPLANT
GLOVE BIOGEL PI IND STRL 8 (GLOVE) IMPLANT
GLOVE BIOGEL PI IND STRL 8.5 (GLOVE) ×2 IMPLANT
GLOVE BIOGEL PI INDICATOR 7.0 (GLOVE) ×1
GLOVE BIOGEL PI INDICATOR 8 (GLOVE) ×1
GLOVE BIOGEL PI INDICATOR 8.5 (GLOVE) ×1
GLOVE SURG ORTHO 8.0 STRL STRW (GLOVE) ×3 IMPLANT
GLOVE SURG SYN 7.5  E (GLOVE) ×1
GLOVE SURG SYN 7.5 E (GLOVE) ×2 IMPLANT
GLOVE SURG SYN 7.5 PF PI (GLOVE) IMPLANT
GOWN STRL REUS W/ TWL LRG LVL3 (GOWN DISPOSABLE) ×2 IMPLANT
GOWN STRL REUS W/TWL 2XL LVL3 (GOWN DISPOSABLE) ×1 IMPLANT
GOWN STRL REUS W/TWL LRG LVL3 (GOWN DISPOSABLE)
GOWN STRL REUS W/TWL XL LVL3 (GOWN DISPOSABLE) ×3 IMPLANT
NDL PRECISIONGLIDE 27X1.5 (NEEDLE) IMPLANT
NEEDLE PRECISIONGLIDE 27X1.5 (NEEDLE) ×3 IMPLANT
NS IRRIG 1000ML POUR BTL (IV SOLUTION) ×3 IMPLANT
PACK BASIN DAY SURGERY FS (CUSTOM PROCEDURE TRAY) ×3 IMPLANT
PAD CAST 3X4 CTTN HI CHSV (CAST SUPPLIES) IMPLANT
PADDING CAST ABS 4INX4YD NS (CAST SUPPLIES) ×1
PADDING CAST ABS COTTON 4X4 ST (CAST SUPPLIES) ×2 IMPLANT
PADDING CAST COTTON 3X4 STRL (CAST SUPPLIES) ×3
SPLINT FINGER 3.25 BULB 911905 (SOFTGOODS) ×2 IMPLANT
SPLINT PLASTER CAST XFAST 3X15 (CAST SUPPLIES) IMPLANT
SPLINT PLASTER XTRA FASTSET 3X (CAST SUPPLIES)
STAPLER VISISTAT 35W (STAPLE) IMPLANT
STOCKINETTE 4X48 STRL (DRAPES) ×3 IMPLANT
STRIP CLOSURE SKIN 1/8X3 (GAUZE/BANDAGES/DRESSINGS) ×1 IMPLANT
SUT CHROMIC 5 0 P 3 (SUTURE) ×5 IMPLANT
SUT CHROMIC 6 0 G 1 (SUTURE) IMPLANT
SUT ETHILON 4 0 PS 2 18 (SUTURE) ×3 IMPLANT
SUT VIC AB 4-0 P2 18 (SUTURE) IMPLANT
SYR BULB 3OZ (MISCELLANEOUS) ×3 IMPLANT
SYR CONTROL 10ML LL (SYRINGE) ×1 IMPLANT
TOWEL OR 17X24 6PK STRL BLUE (TOWEL DISPOSABLE) ×6 IMPLANT
UNDERPAD 30X30 (UNDERPADS AND DIAPERS) ×3 IMPLANT

## 2016-08-28 NOTE — Transfer of Care (Signed)
Immediate Anesthesia Transfer of Care Note  Patient: Beth [name]  Procedure(s) Performed: Procedure(s): Ablation nail matrix left index finger andl left middle finger (Left) SKIN GRAFT FULL THICKNESS upper left arm (Left)  Patient Location: PACU  Anesthesia Type:General  Level of Consciousness: awake and patient cooperative  Airway & Oxygen Therapy: Patient Spontanous Breathing and Patient connected to face mask oxygen  Post-op Assessment: Report given to RN and Post -op Vital signs reviewed and stable  Post vital signs: Reviewed and stable  Last Vitals:  Vitals:   08/28/16 0759  BP: (!) 189/74  Pulse: 85  Resp: 18  Temp: 36.8 C    Last Pain:  Vitals:   08/28/16 0759  TempSrc: Oral      Patients Stated Pain Goal: 0 (43/27/61 4709)  Complications: No apparent anesthesia complications

## 2016-08-28 NOTE — Discharge Instructions (Addendum)

## 2016-08-28 NOTE — Anesthesia Preprocedure Evaluation (Addendum)
Anesthesia Evaluation  Patient identified by MRN, date of birth, ID band Patient awake    Reviewed: Allergy & Precautions, NPO status , Patient's Chart, lab work & pertinent test results  Airway Mallampati: II  TM Distance: >3 FB Neck ROM: Full    Dental  (+) Partial Lower, Partial Upper   Pulmonary former smoker,    breath sounds clear to auscultation       Cardiovascular hypertension, Pt. on medications  Rhythm:Regular Rate:Normal     Neuro/Psych negative neurological ROS  negative psych ROS   GI/Hepatic negative GI ROS, Neg liver ROS,   Endo/Other  diabetes, Type 2, Oral Hypoglycemic Agents  Renal/GU negative Renal ROS  negative genitourinary   Musculoskeletal  (+) Arthritis , Osteoarthritis,    Abdominal   Peds negative pediatric ROS (+)  Hematology negative hematology ROS (+)   Anesthesia Other Findings   Reproductive/Obstetrics negative OB ROS                           Lab Results  Component Value Date   HGB 13.5 09/28/2014   Lab Results  Component Value Date   CREATININE 0.89 08/24/2016   BUN 11 08/24/2016   NA 139 08/24/2016   K 4.8 08/24/2016   CL 106 08/24/2016   CO2 25 08/24/2016   No results found for: INR, PROTIME  08/2016 EKG: normal sinus rhythm.   Anesthesia Physical Anesthesia Plan  ASA: II  Anesthesia Plan: General   Post-op Pain Management:    Induction: Intravenous  Airway Management Planned: LMA  Additional Equipment:   Intra-op Plan:   Post-operative Plan: Extubation in OR  Informed Consent: I have reviewed the patients History and Physical, chart, labs and discussed the procedure including the risks, benefits and alternatives for the proposed anesthesia with the patient or authorized representative who has indicated his/her understanding and acceptance.   Dental advisory given  Plan Discussed with: CRNA  Anesthesia Plan Comments:          Anesthesia Quick Evaluation

## 2016-08-28 NOTE — Anesthesia Procedure Notes (Signed)
Procedure Name: LMA Insertion Date/Time: 08/28/2016 8:45 AM Performed by: Malyah Ohlrich D Pre-anesthesia Checklist: Patient identified, Emergency Drugs available, Suction available and Patient being monitored Patient Re-evaluated:Patient Re-evaluated prior to inductionOxygen Delivery Method: Circle system utilized Preoxygenation: Pre-oxygenation with 100% oxygen Intubation Type: IV induction Ventilation: Mask ventilation without difficulty LMA: LMA inserted LMA Size: 3.0 Number of attempts: 1 Airway Equipment and Method: Bite block Placement Confirmation: positive ETCO2 Tube secured with: Tape Dental Injury: Teeth and Oropharynx as per pre-operative assessment

## 2016-08-28 NOTE — Anesthesia Postprocedure Evaluation (Signed)
Anesthesia Post Note  Patient: Beth [name]  Procedure(s) Performed: Procedure(s) (LRB): Ablation nail matrix left index finger andl left middle finger (Left) SKIN GRAFT FULL THICKNESS upper left arm (Left)  Patient location during evaluation: PACU Anesthesia Type: General Level of consciousness: awake and alert Pain management: pain level controlled Vital Signs Assessment: post-procedure vital signs reviewed and stable Respiratory status: spontaneous breathing, nonlabored ventilation, respiratory function stable and patient connected to nasal cannula oxygen Cardiovascular status: blood pressure returned to baseline and stable Postop Assessment: no signs of nausea or vomiting Anesthetic complications: no    Last Vitals:  Vitals:   08/28/16 1030 08/28/16 1115  BP: (!) 144/67 (!) (P) 160/70  Pulse: 85 (P) 90  Resp: 15 (P) 16  Temp:  (P) 36.6 C    Last Pain:  Vitals:   08/28/16 1115  TempSrc:   PainSc: (P) 0-No pain                 Effie Berkshire

## 2016-08-28 NOTE — Op Note (Signed)
Dictation Number [id number]

## 2016-08-28 NOTE — Brief Op Note (Signed)
08/28/2016  10:07 AM  PATIENT:  Beth [name]  75 y.o. female  PRE-OPERATIVE DIAGNOSIS:  psoriasis left index and middle fingers  POST-OPERATIVE DIAGNOSIS:  psoriasis left index and middle fingers  PROCEDURE:  Procedure(s): Ablation nail matrix left index finger andl left middle finger (Left) SKIN GRAFT FULL THICKNESS upper left arm (Left)  SURGEON:  Surgeon(s) and Role:    * Daryll Brod, MD - Primary  PHYSICIAN ASSISTANT:   ASSISTANTS: none   ANESTHESIA:   local and general  EBL:  Total I/O In: 600 [I.V.:600] Out: -   BLOOD ADMINISTERED:none  DRAINS: none   LOCAL MEDICATIONS USED:  BUPIVICAINE   SPECIMEN:  No Specimen  DISPOSITION OF SPECIMEN:  N/A  COUNTS:  YES  TOURNIQUET:   Total Tourniquet Time Documented: Upper Arm (Left) - 63 minutes Total: Upper Arm (Left) - 63 minutes   DICTATION: .Other Dictation: Dictation Number [id number]  PLAN OF CARE: Discharge to home after PACU  PATIENT DISPOSITION:  PACU - hemodynamically stable.

## 2016-08-28 NOTE — H&P (Signed)
Beth [name] is an 75 y.o. female.   Chief Complaint: nail disorder left index and middle fingers Beth [name] is a 75 year old right-hand-dominant female who  has psoriasis. She is complaining of a detachment of her index finger left hand nail plate. This been present for a year and a half. She states it become painful for her. She states whenever she hits it causes a sharp pain. She has no history of injury. She has had ablation of nails in her right hand thumb and middle fingers. She has a history of diabetes but no history of thyroid problems arthritis or gout. Family history is positive diabetes negative for thyroid problems arthritis and gout.She states her middle finger is problematic also. She would like to have done also          Past Medical History:  Diagnosis Date  . Arthritis    neck and all over  . Cancer ([place]) 1998   hx colon ca-chemo radiation; lung  . Chronic back pain   . Diabetes mellitus without complication ([place])   . Hyperlipemia   . Hypertension   . Sarcoidosis ([place])    of the eye  . Wears glasses   . Wears partial dentures    top and bottom partials    Past Surgical History:  Procedure Laterality Date  . ABDOMINAL HYSTERECTOMY    . APPENDECTOMY    . CATARACT EXTRACTION, BILATERAL    . COLONOSCOPY     many  . COMBINED MEDIASTINOSCOPY AND BRONCHOSCOPY  2002   biopsy  . DG ARTHRO [facility]*  2011   ablasion rt thumb nailbed  . I&D EXTREMITY Right 07/23/2013   Procedure: IRRIGATION AND DEBRIDEMENT DIP JOINT RIGHT INDEX FINGER, EXCISE MUCOID CYST RIGHT INDEX FINGER  ;  Surgeon: Cammie Sickle., MD;  Location: [facility];  Service: Orthopedics;  Laterality: Right;  . LIPOMA EXCISION    . MASS EXCISION Right 09/28/2014   Procedure: EXCISION MASS RIGHT MIDDLE FINGER;  Surgeon: Daryll Brod, MD;  Location: [facility];  Service: Orthopedics;  Laterality: Right;  ANESTHESIA:  GENERAL, IV REGIONAL FAB  . PARTIAL COLECTOMY  1998   cancer-rad/chemo  . SKIN FULL THICKNESS GRAFT Right 07/23/2013   Procedure: NAIL PLATE ABLATION WITH FULL THICKNESS SKIN GRAFT FROM RIGHT   MEDIAL ELBOW TO RIGHT LONG FINGER NAIL BED;  Surgeon: Cammie Sickle., MD;  Location: [facility];  Service: Orthopedics;  Laterality: Right;    Family History  Problem Relation Age of Onset  . Cancer Mother     colon   . Hypertension Father   . CVA Father   . Obesity Brother    Social History:  reports that she quit smoking about 45 years ago. Her smoking use included Cigarettes. She smoked 0.50 packs per day. She has quit using smokeless tobacco. She reports that she does not drink alcohol or use drugs.  Allergies: No Known Allergies  No prescriptions prior to admission.    No results found for this or any previous visit (from the past 48 hour(s)).  No results found.   Pertinent items are noted in HPI.  Height '5\' 2"'$  (1.575 m), weight 70.3 kg (155 lb).  General appearance: alert, cooperative and appears stated age Head: Normocephalic, without obvious abnormality Neck: no JVD Resp: clear to auscultation bilaterally Cardio: regular rate and rhythm, S1, S2 normal, no murmur, click, rub or gallop GI: soft, non-tender; bowel sounds normal; no masses,  no organomegaly Extremities:  psoriasis with nail problems index and middle fingers left hand Pulses: 2+ and symmetric Skin: Skin color, texture, turgor normal. No rashes or lesions Neurologic: Grossly normal Incision/Wound: na  Assessment/Plan Assessment:  1. Nail deformity    Plan: Diagnosis psoriasis with nail plate deformities bilaterally. She is desirous of having the index finger nail ablated. This will be done with over replacement using a skin graft. She is aware that there is no guarantee to the surgery the possibility of infection recurrence injury to arteries nerves tendons and possibility of nail horns forming. This will be scheduled as an outpatient with  ablation of nail matrix left index finger andleft middle finger  using  upper arm full-thickness skin graft. This been scheduled as an outpatient. She is seen with her husband's questions are encouraged and answered to their satisfaction.      Vivian Okelley R 08/28/2016, 3:58 AM

## 2016-08-29 ENCOUNTER — Encounter (HOSPITAL_BASED_OUTPATIENT_CLINIC_OR_DEPARTMENT_OTHER): Payer: Self-pay | Admitting: Orthopedic Surgery

## 2016-08-29 NOTE — Op Note (Signed)
[name], [name]               ACCOUNT NO.:  [id number]  MEDICAL RECORD NO.:  [id number]  LOCATION:                                 FACILITY:  PHYSICIAN:  Daryll Brod, M.D.       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  OPERATION:  Ablation of nail matrix of left index and left middle fingers with full-thickness skin graft from the upper inner arm.  SURGEON:  Daryll Brod, MD.  ASSISTANT:  None.  ANESTHESIA:  General with metacarpal block, local infiltration.  PLACE OF SURGERY:  [facility].  ANESTHESIOLOGISTSSmith Robert.  HISTORY:  The patient is a 75 year old female with a history of psoriatic arthritis.  She has had significant nail deformities to multiple digits.  She has had ablation of nails with skin grafting in the past.  She has left index and middle fingers with deformities to the nail with detachment of the nail plates with resulting discomfort and has requested ablation of the nails with skin graft to each of those digits.  Pre, peri, and postoperative courses have been discussed along with risks and complications.  She is aware that there is no guarantee to the surgery; the possibility of infection; recurrence of injury to arteries, nerves, tendons; incomplete relief of symptoms; dystrophy; the possibility of nail horns forming.  In the preoperative area, the extremity was marked by both patient and surgeon.  Antibiotic given. Questions were encouraged and answered at that time also.  PROCEDURE IN DETAIL:  The patient was brought to the operating room, where general anesthetic was carried out without difficulty.  She  was prepped using ChloraPrep in supine position with the left arm free.  A 3- minute dry time was allowed, and a time-out taken confirming the patient and procedure.  The index finger was attended to first.  Nail plate was removed.  A Beaver blade was then used to trace the nail matrix around the entire tip taking care to excise the proximal corners radially and ulnarly.  The dorsal nail fold was also partially excised leaving skin intact.  This was taken care to attempt to remove the entire nail matrix.  This left a full defect on the dorsal aspect of the index finger.  The middle finger was attended to next.  The nail plate again removed.  The nail matrix was then outlined with a Beaver blade, shaving it off from the distal phalanx and removing the nail corners with great care.  The corners were then coagulated with bipolar in each of the digits.  Wound was copiously irrigated with saline to each of the fingers.  A template was then made from the Esmarch bandage, which was used to exsanguinate the limb at the beginning of the case, and then inflated a tourniquet on the upper arm to 250 mmHg.  The templates were then made  for each of the fingers.  This was then placed on a separate piece of the Esmarch bandage.  This was outlined as an ellipse including both of the templates.  These were each marked.  The template was then placed on the upper inner arm.  With sharp dissection, a full-thickness skin graft 3 cm in length was then elevated and defatted.  The excess skin was removed leaving only the template for the index and middle finger grafts.  These were then sutured into position on each of the digits with a running 5-0 chromic suture.  This was done to each of the digits separately.  A stent dressing was then applied over nonadherent gauze using benzoin and Steri-Strips, maintained with good pressure against the bolster dressing and skin graft on each of the digits.  The skin in the  upper arm was then undermined, and the wound closed with interrupted 4-0 nylon sutures.  A local infiltration with 0.25% bupivacaine to the graft donor site and a metacarpal block to each of the fingers was then given.  A sterile compressive dressing and splints to each of the digits was applied and a sterile compressive dressing to the upper arm graft site was applied on deflation of the tourniquet with the remaining fingers pinked.  She was taken to the recovery room for observation in satisfactory condition.  She will be discharged to home to return to the [place] in 1 week, on Norco.          ______________________________ Daryll Brod, M.D.     GK/MEDQ  D:  08/28/2016  T:  08/29/2016  Job:  [id number]

## 2016-09-22 ENCOUNTER — Encounter (HOSPITAL_BASED_OUTPATIENT_CLINIC_OR_DEPARTMENT_OTHER): Payer: Self-pay | Admitting: Emergency Medicine

## 2016-09-22 ENCOUNTER — Emergency Department (HOSPITAL_BASED_OUTPATIENT_CLINIC_OR_DEPARTMENT_OTHER)
Admission: EM | Admit: 2016-09-22 | Discharge: 2016-09-22 | Disposition: A | Discharge status: Home / Self Care | Payer: Medicare Other | Attending: Emergency Medicine | Admitting: Emergency Medicine

## 2016-09-22 DIAGNOSIS — Z87891 Personal history of nicotine dependence: Secondary | ICD-10-CM | POA: Insufficient documentation

## 2016-09-22 DIAGNOSIS — Z79899 Other long term (current) drug therapy: Secondary | ICD-10-CM | POA: Diagnosis not present

## 2016-09-22 DIAGNOSIS — E119 Type 2 diabetes mellitus without complications: Secondary | ICD-10-CM | POA: Insufficient documentation

## 2016-09-22 DIAGNOSIS — I10 Essential (primary) hypertension: Secondary | ICD-10-CM | POA: Diagnosis not present

## 2016-09-22 LAB — BASIC METABOLIC PANEL
ANION GAP: 9 (ref 5–15)
BUN: 24 mg/dL — ABNORMAL HIGH (ref 6–20)
CHLORIDE: 106 mmol/L (ref 101–111)
CO2: 25 mmol/L (ref 22–32)
CREATININE: 0.91 mg/dL (ref 0.44–1.00)
Calcium: 9.9 mg/dL (ref 8.9–10.3)
GFR calc non Af Amer: >60 mL/min (ref >60)
Glucose, Bld: 86 mg/dL (ref 65–99)
POTASSIUM: 4.5 mmol/L (ref 3.5–5.1)
Sodium: 140 mmol/L (ref 135–145)

## 2016-09-22 MED ORDER — HYDROCHLOROTHIAZIDE 25 MG PO TABS
25.0000 mg | ORAL_TABLET | Freq: Once | ORAL | Status: AC
  Administered 2016-09-22: 25 mg via ORAL
  Filled 2016-09-22: qty 1

## 2016-09-22 MED ORDER — HYDROCHLOROTHIAZIDE 25 MG PO TABS: 25.0000 mg | ORAL_TABLET | Freq: Every day | ORAL | 0 refills | Status: DC

## 2016-09-22 NOTE — Discharge Instructions (Signed)
Continue Ramipril Start hydrochlorothiazide

## 2016-09-22 NOTE — ED Triage Notes (Signed)
Concerned regarding BP was ? 200/104 , has had a h/a this week .

## 2016-09-22 NOTE — ED Notes (Signed)
Pt given d/c instructions as per chart. Verbalizes understanding. No questions. Rx x 1 

## 2016-09-22 NOTE — ED Provider Notes (Signed)
[facility] DEPT [facility] Provider Note   CSN: [id number] Arrival date & time: 09/22/16  1229     History   Chief Complaint Chief Complaint  Patient presents with  . Hypertension    HPI Beth [name] is a 75 y.o. female. She presents for evaluation of high blood pressure. She states that she had headache earlier in the week but not today. [facility] because her blood pressure been running over 200 at times she is asymptomatic.  HPI  Past Medical History:  Diagnosis Date  . Arthritis    neck and all over  . Cancer ([place]) 1998   hx colon ca-chemo radiation; lung  . Chronic back pain   . Diabetes mellitus without complication ([place])   . Hyperlipemia   . Hypertension   . 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 Wears glasses   . Wears partial dentures    top and bottom partials    Patient Active Problem List   Diagnosis Date Noted  . Type 2 diabetes mellitus with hyperglycemia, without long-term current use of insulin ([place]) 04/13/2016    Past Surgical History:  Procedure Laterality Date  . ABDOMINAL HYSTERECTOMY    . APPENDECTOMY    . CATARACT EXTRACTION, BILATERAL    . COLONOSCOPY     many  . COMBINED MEDIASTINOSCOPY AND BRONCHOSCOPY  2002   biopsy  . DG ARTHRO [facility]*  2011   ablasion rt thumb nailbed  . I&D EXTREMITY Right 07/23/2013   Procedure: IRRIGATION AND DEBRIDEMENT DIP JOINT RIGHT INDEX FINGER, EXCISE MUCOID CYST RIGHT INDEX FINGER  ;  Surgeon: Cammie Sickle., MD;  Location: [facility];  Service: Orthopedics;  Laterality: Right;  . LIPOMA EXCISION    . MASS EXCISION Right 09/28/2014   Procedure: EXCISION MASS RIGHT MIDDLE FINGER;  Surgeon: Daryll Brod, MD;  Location: [place];  Service: Orthopedics;  Laterality: Right;  ANESTHESIA:  GENERAL, IV REGIONAL FAB  . NAILBED REPAIR Left 08/28/2016   Procedure: Ablation nail matrix left index finger andl left middle finger;  Surgeon: Daryll Brod, MD;  Location:  [place];  Service: Orthopedics;  Laterality: Left;  . PARTIAL COLECTOMY  1998   cancer-rad/chemo  . SKIN FULL THICKNESS GRAFT Right 07/23/2013   Procedure: NAIL PLATE ABLATION WITH FULL THICKNESS SKIN GRAFT FROM RIGHT   MEDIAL ELBOW TO RIGHT LONG FINGER NAIL BED;  Surgeon: Cammie Sickle., MD;  Location: [place];  Service: Orthopedics;  Laterality: Right;  . SKIN FULL THICKNESS GRAFT Left 08/28/2016   Procedure: SKIN GRAFT FULL THICKNESS upper left arm;  Surgeon: Daryll Brod, MD;  Location: [place];  Service: Orthopedics;  Laterality: Left;    OB History    No data available       Home Medications    Prior to Admission medications   Medication Sig Start Date End Date Taking? Authorizing Provider  acetaminophen (TYLENOL) 500 MG tablet Take 500 mg by mouth every 6 (six) hours as needed for pain.    Historical Provider, MD  glipiZIDE (GLUCOTROL XL) 10 MG 24 hr tablet Take 1/2-1 tablet 2x a day before meals 04/13/16   Philemon Kingdom, MD  glucose blood (ONE TOUCH TEST STRIPS) test strip 1 each by Other route daily. Use as instructed    Historical Provider, MD  hydrochlorothiazide (HYDRODIURIL) 25 MG tablet Take 1 tablet (25 mg total) by mouth daily. 09/22/16  Tanna Furry, MD  HYDROcodone-acetaminophen (NORCO) 5-325 MG tablet Take 1 tablet by mouth every 6 (six) hours as needed for moderate pain. 08/28/16   Daryll Brod, MD  LEVEMIR FLEXTOUCH 100 UNIT/ML Pen INJECT 15 UNITS INTO THE SKIN AT BEDTIME 01/16/16   Philemon Kingdom, MD  lovastatin (MEVACOR) 10 MG tablet Take 10 mg by mouth at bedtime.    Historical Provider, MD  metFORMIN (GLUCOPHAGE) 1000 MG tablet Take 1 tablet (1,000 mg total) by mouth 2 (two) times daily with a meal. 04/13/16   Philemon Kingdom, MD  Multiple Vitamin (MULTIVITAMIN) tablet Take 1 tablet by mouth daily.    Historical Provider, MD  ofloxacin (OCUFLOX) 0.3 % ophthalmic solution  03/22/16   Historical Provider, MD    ramipril (ALTACE) 10 MG capsule Take 10 mg by mouth daily.    Historical Provider, MD  RELION PEN NEEDLES 32G X 4 MM MISC Use 1x a day -ReliOn brand 04/13/16   Philemon Kingdom, MD    Family History Family History  Problem Relation Age of Onset  . Cancer Mother     colon   . Hypertension Father   . CVA Father   . Obesity Brother     Social History Social History  Substance Use Topics  . Smoking status: Former Smoker    Packs/day: 0.50    Types: Cigarettes    Quit date: 07/22/1971  . Smokeless tobacco: Former Systems developer  . Alcohol use No     Comment: rare     Allergies   Patient has no known allergies.   Review of Systems Review of Systems  Constitutional: Negative for appetite change, chills, diaphoresis, fatigue and fever.  HENT: Negative for mouth sores, sore throat and trouble swallowing.   Eyes: Negative for visual disturbance.  Respiratory: Negative for cough, chest tightness, shortness of breath and wheezing.   Cardiovascular: Negative for chest pain.  Gastrointestinal: Negative for abdominal distention, abdominal pain, diarrhea, nausea and vomiting.  Endocrine: Negative for polydipsia, polyphagia and polyuria.  Genitourinary: Negative for dysuria, frequency and hematuria.  Musculoskeletal: Negative for gait problem.  Skin: Negative for color change, pallor and rash.  Neurological: Negative for dizziness, syncope, light-headedness and headaches.  Hematological: Does not bruise/bleed easily.  Psychiatric/Behavioral: Negative for behavioral problems and confusion.     Physical Exam Updated Vital Signs BP 184/66 (BP Location: Right Arm)   Pulse 77   Temp 97.9 F (36.6 C) (Oral)   Resp 20   Ht '5\' 2"'$  (1.575 m)   Wt 160 lb (72.6 kg)   SpO2 100%   BMI 29.26 kg/m   Physical Exam  Constitutional: She is oriented to person, place, and time. She appears well-developed and well-nourished. No distress.  HENT:  Head: Normocephalic.  Eyes: Conjunctivae are normal.  Pupils are equal, round, and reactive to light. No scleral icterus.  Neck: Normal range of motion. Neck supple. No thyromegaly present.  Cardiovascular: Normal rate and regular rhythm.  Exam reveals no gallop and no friction rub.   No murmur heard. Pulmonary/Chest: Effort normal and breath sounds normal. No respiratory distress. She has no wheezes. She has no rales.  Abdominal: Soft. Bowel sounds are normal. She exhibits no distension. There is no tenderness. There is no rebound.  Musculoskeletal: Normal range of motion.  Neurological: She is alert and oriented to person, place, and time.  Skin: Skin is warm and dry. No rash noted.  Psychiatric: She has a normal mood and affect. Her behavior is normal.  ED Treatments / Results  Labs (all labs ordered are listed, but only abnormal results are displayed) Labs Reviewed  BASIC METABOLIC PANEL - Abnormal; Notable for the following:       Result Value   BUN 24 (*)    All other components within normal limits    EKG  EKG Interpretation None       Radiology No results found.  Procedures Procedures (including critical care time)  Medications Ordered in ED Medications  hydrochlorothiazide (HYDRODIURIL) tablet 25 mg (not administered)     Initial Impression / Assessment and Plan / ED Course  I have reviewed the triage vital signs and the nursing notes.  Pertinent labs & imaging results that were available during my care of the patient were reviewed by me and considered in my medical decision making (see chart for details).  Clinical Course     Pressures improving. 150. Normal kidney function. No signs of failure. No symptoms or findings to suggest end organ damage or hypertensive urgency. We'll add hydrochlorothiazide. Primary care follow-up. Continue Ramipril  Final Clinical Impressions(s) / ED Diagnoses   Final diagnoses:  Hypertension, unspecified type    New Prescriptions New Prescriptions   HYDROCHLOROTHIAZIDE  (HYDRODIURIL) 25 MG TABLET    Take 1 tablet (25 mg total) by mouth daily.     Tanna Furry, MD 09/22/16 [phone]

## 2016-10-15 ENCOUNTER — Encounter: Payer: Self-pay | Admitting: Internal Medicine

## 2016-10-15 ENCOUNTER — Ambulatory Visit: Payer: Medicare Other | Admitting: Internal Medicine

## 2016-10-15 ENCOUNTER — Ambulatory Visit (INDEPENDENT_AMBULATORY_CARE_PROVIDER_SITE_OTHER): Payer: Medicare Other | Admitting: Internal Medicine

## 2016-10-15 VITALS — BP 130/84 | HR 66 | Ht 62.0 in | Wt 161.0 lb

## 2016-10-15 DIAGNOSIS — E1165 Type 2 diabetes mellitus with hyperglycemia: Secondary | ICD-10-CM

## 2016-10-15 LAB — POCT GLYCOSYLATED HEMOGLOBIN (HGB A1C): HEMOGLOBIN A1C: 7.3

## 2016-10-15 NOTE — Progress Notes (Signed)
Patient ID: Beth [name], female   DOB: [birth year], 76 y.o.   MRN: [id number]  HPI: Beth [name] is a 76 y.o.-year-old female-year-old female, returning for f/u for DM2, dx in 1990s, insulin-dependent, uncontrolled, without complications. Last visit 6 mo ago.  She has nail psoriasis >> had several nail surgeries recently. She was on Abs and had N/V.   Last hemoglobin A1c was: Lab Results  Component Value Date   HGBA1C 6.9 04/13/2016   HGBA1C 7.1 10/24/2015   HGBA1C 8.4 (H) 07/18/2015  Received labs from PCP (Dr. Hulan [name]) from 01/25/2016: HbA1c 7.0%, glucose 73  Pt is on a regimen of: - Metformin ER 1000 mg 2x a day, with meals - Glipizide XL (1/2 x of the 10 mg) 2x a day - skips doses! - Levemir 15 units at bedtime - started 06/2015 Patient was previously on Januvia and then Victoza. Of note, I received the records from PCP, and the lipase was elevated, at 74 (0-59) on 07/08/2015, then 69 on 07/18/2015. She was on Invokana 100 mg in a.m. >> yeast infection.  Pt checks her sugars 1x a day and they are: - am: 129-140 >> 62-115, 125 >> 85-107, 130 >> 91-121 >> 96-123 - 2h after b'fast: n/c >> 125-140 >> n/c >> 132-179 >> 159 - before lunch: n/c >> 105-143 >> 117-119 >> 92-140 >> 99-140 - 2h after lunch: n/c >> 116-134 >> 130 >> 135-180 >> 124-160, 219 - before dinner: n/c >> 91-122 >> 69-140 >> 116-140 >> 111-154 - 2h after dinner: n/c >> 148-180 >> 169 >> 165-224, 240 >> 171-241 - bedtime: n/c >> 134-192, 217 >> 133-170 >> n/c - nighttime: n/c >> 180, 212 >> n/c No lows. Lowest sugar was 123 (on Victoza) >> 62 >> 70s >> 96; she has hypoglycemia awareness at 70.  Highest sugar was 76 >> 217 >> 240 >> 241.  Glucometer: ReliOn  Pt's meals are: - Breakfast: scrambled eggs, toast or cereals or yoghurt - Lunch: crackers and cheese, carrots, apple - Dinner: meat + salad/veggies + starch - Snacks: 2-3: cashews, cheese and carrots, almost milk + graham crackers  - no CKD, last  BUN/creatinine:  Lab Results  Component Value Date   BUN 24 (H) 09/22/2016   CREATININE 0.91 09/22/2016  On Ramipril. - last set of lipids: Lab Results  Component Value Date   CHOL 151 01/28/2015   HDL 37 01/28/2015   LDLCALC 71 01/28/2015   TRIG 216 (A) 01/28/2015  On Lovastatin. - last eye exam was in 02/13/2016. No DR. Dr Beth [name]. - no numbness and tingling in her feet. She had a normal foot exam by PCP on 06/22/2015.  She has a history of colon cancer 18 years ago. She remembers that the mass perforated her colon at that time.  ROS: Constitutional: no weight gain/loss, no fatigue, no subjective hyperthermia/hypothermia Eyes: no blurry vision, no xerophthalmia ENT: no sore throat, no nodules palpated in throat, no dysphagia/odynophagia, no hoarseness Cardiovascular: no CP/SOB/palpitations/leg swelling Respiratory: no cough/SOB Gastrointestinal: no N/V/D/C Musculoskeletal: no muscle/joint aches Skin: no rashes Neurological: no tremors/numbness/tingling/dizziness  I reviewed pt's medications, allergies, PMH, social hx, family hx, and changes were documented in the history of present illness. Otherwise, unchanged from my initial visit note.  Past Medical History:  Diagnosis Date  . Arthritis    neck and all over  . Cancer ([place]) 1998   hx colon ca-chemo radiation; lung  . Chronic back pain   . Diabetes mellitus without complication ([place])   .  Hyperlipemia   . Hypertension   . 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 Wears glasses   . Wears partial dentures    top and bottom partials   Past Surgical History:  Procedure Laterality Date  . ABDOMINAL HYSTERECTOMY    . APPENDECTOMY    . CATARACT EXTRACTION, BILATERAL    . COLONOSCOPY     many  . COMBINED MEDIASTINOSCOPY AND BRONCHOSCOPY  2002   biopsy  . DG ARTHRO [facility]*  2011   ablasion rt thumb nailbed  . I&D EXTREMITY Right 07/23/2013   Procedure: IRRIGATION AND DEBRIDEMENT DIP JOINT RIGHT INDEX FINGER, EXCISE  MUCOID CYST RIGHT INDEX FINGER  ;  Surgeon: Beth [name]., MD;  Location: [place];  Service: Orthopedics;  Laterality: Right;  . LIPOMA EXCISION    . MASS EXCISION Right 09/28/2014   Procedure: EXCISION MASS RIGHT MIDDLE FINGER;  Surgeon: Beth Brod, MD;  Location: [facility];  Service: Orthopedics;  Laterality: Right;  ANESTHESIA:  GENERAL, IV REGIONAL FAB  . NAILBED REPAIR Left 08/28/2016   Procedure: Ablation nail matrix left index finger andl left middle finger;  Surgeon: Beth Brod, MD;  Location: [place];  Service: Orthopedics;  Laterality: Left;  . PARTIAL COLECTOMY  1998   cancer-rad/chemo  . SKIN FULL THICKNESS GRAFT Right 07/23/2013   Procedure: NAIL PLATE ABLATION WITH FULL THICKNESS SKIN GRAFT FROM RIGHT   MEDIAL ELBOW TO RIGHT LONG FINGER NAIL BED;  Surgeon: Beth [name]., MD;  Location: [place];  Service: Orthopedics;  Laterality: Right;  . SKIN FULL THICKNESS GRAFT Left 08/28/2016   Procedure: SKIN GRAFT FULL THICKNESS upper left arm;  Surgeon: Beth Brod, MD;  Location: [place];  Service: Orthopedics;  Laterality: Left;   Social History   Social History  . Marital Status: Married    Spouse Name: N/A  . Number of Children: 3   Occupational History  . retired   Social History Main Topics  . Smoking status: Former Smoker -- 0.50 packs/day    Types: Cigarettes    Quit date: 07/22/1971  . Smokeless tobacco: Not on file  . Alcohol Use: No     Comment: rare  . Drug Use: No  No Known Allergies Family History  Problem Relation Age of Onset  . Cancer Mother     colon   . Hypertension Father   . CVA Father   . Obesity Brother    Current Outpatient Prescriptions  Medication Sig Dispense Refill  . acetaminophen (TYLENOL) 500 MG tablet Take 500 mg by mouth every 6 (six) hours as needed for pain.    Marland Kitchen glipiZIDE (GLUCOTROL XL) 10 MG 24 hr tablet Take 1/2-1 tablet 2x a day before  meals 180 tablet 3  . glucose blood (ONE TOUCH TEST STRIPS) test strip 1 each by Other route daily. Use as instructed    . hydrochlorothiazide (HYDRODIURIL) 25 MG tablet Take 1 tablet (25 mg total) by mouth daily. 30 tablet 0  . LEVEMIR FLEXTOUCH 100 UNIT/ML Pen INJECT 15 UNITS INTO THE SKIN AT BEDTIME 15 mL 2  . lovastatin (MEVACOR) 10 MG tablet Take 10 mg by mouth at bedtime.    . metFORMIN (GLUCOPHAGE) 1000 MG tablet Take 1 tablet (1,000 mg total) by mouth 2 (two) times daily with a meal. 180 tablet 3  . Multiple Vitamin (MULTIVITAMIN) tablet Take 1 tablet by mouth daily.    . ramipril (ALTACE)  10 MG capsule Take 10 mg by mouth daily.    Marland Kitchen RELION PEN NEEDLES 32G X 4 MM MISC Use 1x a day -ReliOn brand 100 each 5  . HYDROcodone-acetaminophen (NORCO) 5-325 MG tablet Take 1 tablet by mouth every 6 (six) hours as needed for moderate pain. (Patient not taking: Reported on 10/15/2016) 30 tablet 0  . ofloxacin (OCUFLOX) 0.3 % ophthalmic solution      No current facility-administered medications for this visit.     PE: BP 130/84 (BP Location: Left Arm, Patient Position: Sitting)   Pulse 66   Ht '5\' 2"'$  (1.575 m)   Wt 161 lb (73 kg)   BMI 29.45 kg/m  Body mass index is 29.45 kg/m. Wt Readings from Last 3 Encounters:  10/15/16 161 lb (73 kg)  09/22/16 160 lb (72.6 kg)  08/28/16 164 lb (74.4 kg)   Constitutional: overweight, in NAD Eyes: PERRLA, EOMI, no exophthalmos ENT: moist mucous membranes, no thyromegaly, no cervical lymphadenopathy Cardiovascular: RRR, No MRG Respiratory: CTA B Gastrointestinal: abdomen soft, NT, ND, BS+ Musculoskeletal: no deformities, strength intact in all 4 Skin: moist, warm, no rashes Neurological: no tremor with outstretched hands, DTR normal in all 4  ASSESSMENT: 1. DM2, insulin-dependent, uncontrolled, without complications  PLAN:  1. Patient with long-standing,  fairly well controlled diabetes, on oral antidiabetic regimen and now basal insulin, with  much improved control, however, worse since last visit 2/2 surgeries, ABx and N/V episodes. Now off ABx. Sugars are at goal in am but are high after dinner as she forgets the Glipizide then. Advised to take it before dinner. - I suggested to:  Patient Instructions  Please continue: - Levemir 15 units at bedtime - Metformin ER 1000 mg 2x a day, with meals  Try to take: - Glipizide XL 10 mg 1/2 of tablet before b'fast and dinner.   Please return in 4 months with your sugar log.   - continue checking sugars at different times of the day - check 1 time a day, rotating checks - advised for yearly eye exams >> UTD - will need a Lipid level >> will ask for records from PCP - UTD with flu shot - new HbA1c 7.3% (higher) - Return to clinic in 4 mo with sugar log   Philemon Kingdom, MD PhD [facility]

## 2016-10-15 NOTE — Patient Instructions (Addendum)
Please continue: - Levemir 15 units at bedtime - Metformin ER 1000 mg 2x a day, with meals  Try to take: - Glipizide XL 10 mg 1/2 of tablet before b'fast and dinner.   Please return in 4 months with your sugar log.

## 2016-10-31 DIAGNOSIS — I10 Essential (primary) hypertension: Secondary | ICD-10-CM | POA: Diagnosis not present

## 2016-11-15 ENCOUNTER — Other Ambulatory Visit: Payer: Self-pay | Admitting: Internal Medicine

## 2017-01-09 DIAGNOSIS — R2231 Localized swelling, mass and lump, right upper limb: Secondary | ICD-10-CM | POA: Diagnosis not present

## 2017-01-16 DIAGNOSIS — Z794 Long term (current) use of insulin: Secondary | ICD-10-CM | POA: Diagnosis not present

## 2017-01-16 DIAGNOSIS — E119 Type 2 diabetes mellitus without complications: Secondary | ICD-10-CM | POA: Diagnosis not present

## 2017-01-16 DIAGNOSIS — I10 Essential (primary) hypertension: Secondary | ICD-10-CM | POA: Diagnosis not present

## 2017-01-23 DIAGNOSIS — N189 Chronic kidney disease, unspecified: Secondary | ICD-10-CM | POA: Diagnosis not present

## 2017-01-23 DIAGNOSIS — Z85038 Personal history of other malignant neoplasm of large intestine: Secondary | ICD-10-CM | POA: Diagnosis not present

## 2017-01-23 DIAGNOSIS — Z7984 Long term (current) use of oral hypoglycemic drugs: Secondary | ICD-10-CM | POA: Diagnosis not present

## 2017-01-23 DIAGNOSIS — Z0000 Encounter for general adult medical examination without abnormal findings: Secondary | ICD-10-CM | POA: Diagnosis not present

## 2017-01-23 DIAGNOSIS — I10 Essential (primary) hypertension: Secondary | ICD-10-CM | POA: Diagnosis not present

## 2017-01-23 DIAGNOSIS — R8290 Unspecified abnormal findings in urine: Secondary | ICD-10-CM | POA: Diagnosis not present

## 2017-01-23 DIAGNOSIS — E118 Type 2 diabetes mellitus with unspecified complications: Secondary | ICD-10-CM | POA: Diagnosis not present

## 2017-01-23 DIAGNOSIS — E782 Mixed hyperlipidemia: Secondary | ICD-10-CM | POA: Diagnosis not present

## 2017-01-30 ENCOUNTER — Encounter: Payer: Self-pay | Admitting: Internal Medicine

## 2017-01-30 NOTE — Progress Notes (Signed)
Received labs from PCP from 01/16/2017: HbA1c 7.4%  Lipids 141/139/45/69 TSH 2.96 BUN/creatinine 21/0.96, glucose 95

## 2017-02-12 ENCOUNTER — Encounter: Payer: Self-pay | Admitting: Internal Medicine

## 2017-02-12 ENCOUNTER — Ambulatory Visit (INDEPENDENT_AMBULATORY_CARE_PROVIDER_SITE_OTHER): Payer: Medicare Other | Admitting: Internal Medicine

## 2017-02-12 VITALS — BP 128/82 | HR 71 | Wt 159.0 lb

## 2017-02-12 DIAGNOSIS — E1165 Type 2 diabetes mellitus with hyperglycemia: Secondary | ICD-10-CM | POA: Diagnosis not present

## 2017-02-12 NOTE — Patient Instructions (Addendum)
Please continue: - Levemir 15 units at bedtime - Metformin ER 1000 mg 2x a day, with meals - Glipizide XL 10 mg before b'fast and 1/2 of tablet (5 mg) before dinner.   Write down what you ate and if you took Glipizide before dinner if the sugars after dinner are high.  Please return in 4 months with your sugar log.  

## 2017-02-12 NOTE — Progress Notes (Signed)
Patient ID: Beth [name], female   DOB: 1940-11-20, 76 y.o.   MRN: [id number]  HPI: Beth [name] is a 76 y.o.-year-old female, returning for f/u for DM2, dx in 1990s, insulin-dependent, uncontrolled, without complications. Last visit 4 mo ago.  Last hemoglobin A1c was: Lab Results  Component Value Date   HGBA1C 7.3 10/15/2016   HGBA1C 6.9 04/13/2016   HGBA1C 7.1 10/24/2015  Received labs from PCP (Dr. Hulan Fess) from 01/25/2016: HbA1c 7.0%, glucose 73  Pt is on a regimen of: - Metformin 1000 mg 2x a day, with meals - Glipizide XL 10 mg before b'fast and 5 mg (1/2) before a larger dinner  - Levemir 12-15 units at bedtime - started 06/2015 Patient was previously on Januvia and then Victoza. Lipase was elevated, at 74 (0-59) on 07/08/2015, then 69 on 07/18/2015. She was on Invokana 100 mg in a.m. >> yeast infection.  Pt checks her sugars 0-1x a day and they are: - am: 129-140 >> 62-115, 125 >> 85-107, 130 >> 91-121 >> 96-123 >> 96-125 - 2h after b'fast: n/c >> 125-140 >> n/c >> 132-179 >> 159 >> 123, 135 - before lunch: n/c >> 105-143 >> 117-119 >> 92-140 >> 99-140 >> n/c - 2h after lunch: n/c >> 116-134 >> 130 >> 135-180 >> 124-160, 219 >> 177 - before dinner: n/c >> 91-122 >> 69-140 >> 116-140 >> 111-154 >> 115, 149 - 2h after dinner: n/c >> 148-180 >> 169 >> 165-224, 240 >> 171-241 >> 173-221, 304, 314 - bedtime: n/c >> 134-192, 217 >> 133-170 >> n/c - nighttime: n/c >> 180, 212 >> n/c No lows. Lowest sugar was 123 (on Victoza) >> 62 >> 70s >> 96 >> 96; she has hypoglycemia awareness at 70.  Highest sugar was 282 >> 217 >> 240 >> 241 >> 314.  Glucometer: ReliOn  Pt's meals are: - Breakfast: scrambled eggs, toast or cereals or yoghurt - Lunch: crackers and cheese, carrots, apple - Dinner: meat + salad/veggies + starch - Snacks: 2-3: cashews, cheese and carrots, almost milk + graham crackers  - no CKD, last BUN/creatinine:  01/16/2017: BUN/creatinine 21/0.96, glucose  95 Lab Results  Component Value Date   BUN 24 (H) 09/22/2016   CREATININE 0.91 09/22/2016  On Ramipril. - last set of lipids:  01/16/2017: 141/139/45/69 Lab Results  Component Value Date   CHOL 151 01/28/2015   HDL 37 01/28/2015   LDLCALC 71 01/28/2015   TRIG 216 (A) 01/28/2015  On Lovastatin. - last eye exam was in 02/13/2016. No DR. Dr Gershon Crane.Has one coming up 02/25/2017. - denies numbness and tingling in her feet.   She has a history of colon cancer 18 years ago. She remembers that the mass perforated her colon at that time.  ROS: Constitutional: no weight gain/no weight loss, no fatigue, no subjective hyperthermia, no subjective hypothermia Eyes: no blurry vision, no xerophthalmia ENT: no sore throat, no nodules palpated in throat, no dysphagia, no odynophagia, no hoarseness Cardiovascular: no CP/no SOB/no palpitations/no leg swelling Respiratory: no cough/no SOB/no wheezing Gastrointestinal: no N/no V/no D/no C/no acid reflux Musculoskeletal: no muscle aches/no joint aches Skin: no rashes, no hair loss Neurological: no tremors/no numbness/no tingling/no dizziness  I reviewed pt's medications, allergies, PMH, social hx, family hx, and changes were documented in the history of present illness. Otherwise, unchanged from my initial visit note.   Past Medical History:  Diagnosis Date  . Arthritis    neck and all over  . Cancer ([place]) 1998  hx colon ca-chemo radiation; lung  . Chronic back pain   . Diabetes mellitus without complication ([place])   . Hyperlipemia   . Hypertension   . Sarcoidosis    of the eye  . Wears glasses   . Wears partial dentures    top and bottom partials   Past Surgical History:  Procedure Laterality Date  . ABDOMINAL HYSTERECTOMY    . APPENDECTOMY    . CATARACT EXTRACTION, BILATERAL    . COLONOSCOPY     many  . COMBINED MEDIASTINOSCOPY AND BRONCHOSCOPY  2002   biopsy  . DG ARTHRO [facility]*  2011   ablasion rt thumb nailbed  . I&D  EXTREMITY Right 07/23/2013   Procedure: IRRIGATION AND DEBRIDEMENT DIP JOINT RIGHT INDEX FINGER, EXCISE MUCOID CYST RIGHT INDEX FINGER  ;  Surgeon: Cammie Sickle., MD;  Location: [place];  Service: Orthopedics;  Laterality: Right;  . LIPOMA EXCISION    . MASS EXCISION Right 09/28/2014   Procedure: EXCISION MASS RIGHT MIDDLE FINGER;  Surgeon: Daryll Brod, MD;  Location: [place];  Service: Orthopedics;  Laterality: Right;  ANESTHESIA:  GENERAL, IV REGIONAL FAB  . NAILBED REPAIR Left 08/28/2016   Procedure: Ablation nail matrix left index finger andl left middle finger;  Surgeon: Daryll Brod, MD;  Location: [place];  Service: Orthopedics;  Laterality: Left;  . PARTIAL COLECTOMY  1998   cancer-rad/chemo  . SKIN FULL THICKNESS GRAFT Right 07/23/2013   Procedure: NAIL PLATE ABLATION WITH FULL THICKNESS SKIN GRAFT FROM RIGHT   MEDIAL ELBOW TO RIGHT LONG FINGER NAIL BED;  Surgeon: Cammie Sickle., MD;  Location: [place];  Service: Orthopedics;  Laterality: Right;  . SKIN FULL THICKNESS GRAFT Left 08/28/2016   Procedure: SKIN GRAFT FULL THICKNESS upper left arm;  Surgeon: Daryll Brod, MD;  Location: [place];  Service: Orthopedics;  Laterality: Left;   Social History   Social History  . Marital Status: Married    Spouse Name: N/A  . Number of Children: 3   Occupational History  . retired   Social History Main Topics  . Smoking status: Former Smoker -- 0.50 packs/day    Types: Cigarettes    Quit date: 07/22/1971  . Smokeless tobacco: Not on file  . Alcohol Use: No     Comment: rare  . Drug Use: No  No Known Allergies Family History  Problem Relation Age of Onset  . Cancer Mother        colon   . Hypertension Father   . CVA Father   . Obesity Brother    Current Outpatient Prescriptions  Medication Sig Dispense Refill  . acetaminophen (TYLENOL) 500 MG tablet Take 500 mg by mouth every 6 (six)  hours as needed for pain.    Marland Kitchen glipiZIDE (GLUCOTROL XL) 10 MG 24 hr tablet Take 1/2-1 tablet 2x a day before meals 180 tablet 3  . glucose blood (ONE TOUCH TEST STRIPS) test strip 1 each by Other route daily. Use as instructed    . hydrochlorothiazide (HYDRODIURIL) 25 MG tablet Take 1 tablet (25 mg total) by mouth daily. 30 tablet 0  . LEVEMIR FLEXTOUCH 100 UNIT/ML Pen INJECT 15 UNITS SUBCUTANEOUSLY AT BEDTIME 15 pen 2  . lovastatin (MEVACOR) 10 MG tablet Take 10 mg by mouth at bedtime.    . metFORMIN (GLUCOPHAGE) 1000 MG tablet Take 1 tablet (1,000 mg total) by mouth 2 (two) times daily with a meal.  180 tablet 3  . Multiple Vitamin (MULTIVITAMIN) tablet Take 1 tablet by mouth daily.    Marland Kitchen ofloxacin (OCUFLOX) 0.3 % ophthalmic solution     . ramipril (ALTACE) 10 MG capsule Take 10 mg by mouth daily.    Marland Kitchen RELION PEN NEEDLES 32G X 4 MM MISC Use 1x a day -ReliOn brand 100 each 5  . HYDROcodone-acetaminophen (NORCO) 5-325 MG tablet Take 1 tablet by mouth every 6 (six) hours as needed for moderate pain. (Patient not taking: Reported on 10/15/2016) 30 tablet 0   No current facility-administered medications for this visit.     PE: BP 128/82 (BP Location: Left Arm, Patient Position: Sitting)   Pulse 71   Wt 159 lb (72.1 kg)   SpO2 95%   BMI 29.08 kg/m  Body mass index is 29.08 kg/m. Wt Readings from Last 3 Encounters:  02/12/17 159 lb (72.1 kg)  10/15/16 161 lb (73 kg)  09/22/16 160 lb (72.6 kg)   Constitutional: overweight, in NAD Eyes: PERRLA, EOMI, no exophthalmos ENT: moist mucous membranes, no thyromegaly, no cervical lymphadenopathy Cardiovascular: RRR, No MRG Respiratory: CTA B Gastrointestinal: abdomen soft, NT, ND, BS+ Musculoskeletal: no deformities, strength intact in all 4 Skin: moist, warm, no rashes Neurological: no tremor with outstretched hands, DTR normal in all 4  ASSESSMENT: 1. DM2, insulin-dependent, uncontrolled, without long complications, but with  hyperglycemia  PLAN:  1. Patient with long-standing,  fairly well controlled diabetes, on oral DM medication regimen. Last HbA1c obtained by PCP earlier this mo >> reviewed >> slightly high, but still at goal for her. The problem appears to be coming from sugars in the 300s occasionally after dinner. She is not sure what foods trigger these highs or if she forgot the Glipizide before dinner in those situations. I advised her to start writing these details down and then try to avoid these foods. OTW, I will not change her regimen. - last HbA1c reviewed: slightly higher, at 7.4% - I suggested to:  Patient Instructions  Please continue: - Levemir 15 units at bedtime - Metformin ER 1000 mg 2x a day, with meals - Glipizide XL 10 mg before b'fast and 1/2 of tablet (5 mg) before dinner.   Write down what you ate and if you took Glipizide before dinner if the sugars after dinner are high.  Please return in 4 months with your sugar log.   - continue checking sugars at different times of the day - check 1x a day, rotating checks - advised for yearly eye exams >> she is UTD, will have a new one soon - Return to clinic in 4 mo with sugar log   Philemon Kingdom, MD PhD [facility] Endocrin[facility]

## 2017-02-25 DIAGNOSIS — E119 Type 2 diabetes mellitus without complications: Secondary | ICD-10-CM | POA: Diagnosis not present

## 2017-02-25 DIAGNOSIS — Z794 Long term (current) use of insulin: Secondary | ICD-10-CM | POA: Diagnosis not present

## 2017-02-25 DIAGNOSIS — Z961 Presence of intraocular lens: Secondary | ICD-10-CM | POA: Diagnosis not present

## 2017-03-25 ENCOUNTER — Other Ambulatory Visit: Payer: Self-pay | Admitting: Family Medicine

## 2017-03-25 DIAGNOSIS — Z1231 Encounter for screening mammogram for malignant neoplasm of breast: Secondary | ICD-10-CM

## 2017-04-08 ENCOUNTER — Other Ambulatory Visit: Payer: Self-pay | Admitting: Internal Medicine

## 2017-05-13 ENCOUNTER — Ambulatory Visit
Admission: RE | Admit: 2017-05-13 | Discharge: 2017-05-13 | Disposition: A | Discharge status: Home / Self Care | Payer: Medicare Other | Source: Ambulatory Visit | Attending: Family Medicine | Admitting: Family Medicine

## 2017-05-13 DIAGNOSIS — Z1231 Encounter for screening mammogram for malignant neoplasm of breast: Secondary | ICD-10-CM

## 2017-06-10 ENCOUNTER — Other Ambulatory Visit: Payer: Self-pay | Admitting: Internal Medicine

## 2017-06-14 ENCOUNTER — Ambulatory Visit (INDEPENDENT_AMBULATORY_CARE_PROVIDER_SITE_OTHER): Payer: Medicare Other | Admitting: Internal Medicine

## 2017-06-14 ENCOUNTER — Encounter: Payer: Self-pay | Admitting: Internal Medicine

## 2017-06-14 VITALS — BP 138/74 | HR 73 | Ht 62.0 in | Wt 157.0 lb

## 2017-06-14 DIAGNOSIS — E785 Hyperlipidemia, unspecified: Secondary | ICD-10-CM

## 2017-06-14 DIAGNOSIS — E1165 Type 2 diabetes mellitus with hyperglycemia: Secondary | ICD-10-CM | POA: Diagnosis not present

## 2017-06-14 LAB — POCT GLYCOSYLATED HEMOGLOBIN (HGB A1C): Hemoglobin A1C: 7.2

## 2017-06-14 NOTE — Progress Notes (Signed)
Patient ID: Beth [name], female   DOB: 1941-02-15, 76 y.o.   MRN: [id number]  HPI: Beth [name] is a 76 y.o.-year-old female, returning for f/u for DM2, dx in 1990s, insulin-dependent, uncontrolled, without complications. Last visit 4 mo ago.  Last hemoglobin A1c was: 01/16/2017: HbA1c 7.4% Lab Results  Component Value Date   HGBA1C 7.3 10/15/2016   HGBA1C 6.9 04/13/2016   HGBA1C 7.1 10/24/2015  Received labs from PCP (Dr. Hulan Fess) from 01/25/2016: HbA1c 7.0%, glucose 73  Pt is on a regimen of: - Metformin 1000 mg 2x a day, with meals - Glipizide XL 10 mg before b'fast and 5 mg (1/2 tab) before a larger dinner - but not taking this second dose in last 2 mo as she could not cut the pill - Levemir 15 units at bedtime - started 06/2015 Patient was previously on Januvia and then Victoza. Lipase was elevated, at 74 (0-59) on 07/08/2015, then 69 on 07/18/2015. She was on Invokana 100 mg in a.m. >> yeast infection.  Pt checks her sugars 1x a day: - am:  85-107, 130 >> 91-121 >> 96-123 >> 96-125 >> 89-117, 124 - 2h after b'fast: 132-179 >> 159 >> 123, 135 >> n/c - before lunch:  117-119 >> 92-140 >> 99-140 >> n/c >> 108-125 - 2h after lunch:135-180 >> 124-160, 219 >> 177 >> 169, 181 - before dinner: 116-140 >> 111-154 >> 115, 149 >> n/c - 2h after dinner: 171-241 >> 173-221, 304, 314 >> 162-214, 262 - bedtime: n/c >> 134-192, 217 >> 133-170 >> n/c - nighttime: n/c >> 180, 212 >> n/c No lows. Lowest sugar was 96 >> 89; she has hypoglycemia awareness at 70.  Highest sugar was 314 >> 262 x1.  Glucometer: ReliOn  Pt's meals are: - Breakfast: scrambled eggs, toast or cereals or yoghurt - Lunch: crackers and cheese, carrots, apple - Dinner: meat + salad/veggies + starch - Snacks: 2-3: cashews, cheese and carrots, almost milk + graham crackers  - No CKD, last BUN/creatinine:  01/16/2017: BUN/creatinine 21/0.96, glucose 95 Lab Results  Component Value Date   BUN 24 (H)  09/22/2016   CREATININE 0.91 09/22/2016  On Ramipril. - last set of lipids:  01/16/2017: 141/139/45/69 Lab Results  Component Value Date   CHOL 151 01/28/2015   HDL 37 01/28/2015   LDLCALC 71 01/28/2015   TRIG 216 (A) 01/28/2015  On Lovastatin. - last eye exam was 02/2017 >> No DR, No macular edema. Dr. Gershon Crane. - She denies numbness and tingling in her feet.   She has a history of colon cancer ~2000. She remembers that the mass perforated her colon at that time.  ROS: Constitutional: no weight gain/no weight loss, no fatigue, no subjective hyperthermia, no subjective hypothermia Eyes: no blurry vision, no xerophthalmia ENT: no sore throat, no nodules palpated in throat, no dysphagia, no odynophagia, no hoarseness Cardiovascular: no CP/no SOB/no palpitations/no leg swelling Respiratory: no cough/no SOB/no wheezing Gastrointestinal: no N/no V/no D/no C/no acid reflux Musculoskeletal: no muscle aches/no joint aches Skin: no rashes, no hair loss Neurological: no tremors/no numbness/no tingling/no dizziness  I reviewed pt's medications, allergies, PMH, social hx, family hx, and changes were documented in the history of present illness. Otherwise, unchanged from my initial visit note.  Past Medical History:  Diagnosis Date  . Arthritis    neck and all over  . Cancer ([place]) 1998   hx colon ca-chemo radiation; lung  . Chronic back pain   . Diabetes mellitus without complication ([place])   .  Hyperlipemia   . Hypertension   . Sarcoidosis    of the eye  . Wears glasses   . Wears partial dentures    top and bottom partials   Past Surgical History:  Procedure Laterality Date  . ABDOMINAL HYSTERECTOMY    . APPENDECTOMY    . CATARACT EXTRACTION, BILATERAL    . COLONOSCOPY     many  . COMBINED MEDIASTINOSCOPY AND BRONCHOSCOPY  2002   biopsy  . DG ARTHRO [facility]*  2011   ablasion rt thumb nailbed  . I&D EXTREMITY Right 07/23/2013   Procedure: IRRIGATION AND DEBRIDEMENT DIP JOINT  RIGHT INDEX FINGER, EXCISE MUCOID CYST RIGHT INDEX FINGER  ;  Surgeon: Cammie Sickle., MD;  Location: [place];  Service: Orthopedics;  Laterality: Right;  . LIPOMA EXCISION    . MASS EXCISION Right 09/28/2014   Procedure: EXCISION MASS RIGHT MIDDLE FINGER;  Surgeon: Daryll Brod, MD;  Location: [place];  Service: Orthopedics;  Laterality: Right;  ANESTHESIA:  GENERAL, IV REGIONAL FAB  . NAILBED REPAIR Left 08/28/2016   Procedure: Ablation nail matrix left index finger andl left middle finger;  Surgeon: Daryll Brod, MD;  Location: [place];  Service: Orthopedics;  Laterality: Left;  . PARTIAL COLECTOMY  1998   cancer-rad/chemo  . SKIN FULL THICKNESS GRAFT Right 07/23/2013   Procedure: NAIL PLATE ABLATION WITH FULL THICKNESS SKIN GRAFT FROM RIGHT   MEDIAL ELBOW TO RIGHT LONG FINGER NAIL BED;  Surgeon: Cammie Sickle., MD;  Location: [place];  Service: Orthopedics;  Laterality: Right;  . SKIN FULL THICKNESS GRAFT Left 08/28/2016   Procedure: SKIN GRAFT FULL THICKNESS upper left arm;  Surgeon: Daryll Brod, MD;  Location: [place];  Service: Orthopedics;  Laterality: Left;   Social History   Social History  . Marital Status: Married    Spouse Name: N/A  . Number of Children: 3   Occupational History  . retired   Social History Main Topics  . Smoking status: Former Smoker -- 0.50 packs/day    Types: Cigarettes    Quit date: 07/22/1971  . Smokeless tobacco: Not on file  . Alcohol Use: No     Comment: rare  . Drug Use: No  No Known Allergies Family History  Problem Relation Age of Onset  . Cancer Mother        colon   . Hypertension Father   . CVA Father   . Obesity Brother    Current Outpatient Prescriptions  Medication Sig Dispense Refill  . acetaminophen (TYLENOL) 500 MG tablet Take 500 mg by mouth every 6 (six) hours as needed for pain.    Marland Kitchen GLIPIZIDE XL 10 MG 24 hr tablet TAKE HALF TO 1  TABLET TWICE DAILY BEFORE MEALS 180 tablet 3  . glucose blood (ONE TOUCH TEST STRIPS) test strip 1 each by Other route daily. Use as instructed    . hydrochlorothiazide (HYDRODIURIL) 25 MG tablet Take 1 tablet (25 mg total) by mouth daily. 30 tablet 0  . LEVEMIR FLEXTOUCH 100 UNIT/ML Pen INJECT 15 UNITS SUBCUTANEOUSLY AT BEDTIME 15 pen 2  . lovastatin (MEVACOR) 10 MG tablet Take 10 mg by mouth at bedtime.    . metFORMIN (GLUCOPHAGE) 1000 MG tablet TAKE ONE TABLET BY MOUTH TWICE DAILY WITH A MEALS 180 tablet 3  . Multiple Vitamin (MULTIVITAMIN) tablet Take 1 tablet by mouth daily.    . ramipril (ALTACE) 10 MG capsule Take 10  mg by mouth daily.    Marland Kitchen RELION PEN NEEDLES 32G X 4 MM MISC Use 1x a day -ReliOn brand 100 each 5   No current facility-administered medications for this visit.    PE: BP 138/74 (BP Location: Left Arm, Patient Position: Sitting)   Pulse 73   Ht 5\' 2"  (1.575 m)   Wt 157 lb (71.2 kg)   SpO2 94%   BMI 28.72 kg/m  Body mass index is 28.72 kg/m. Wt Readings from Last 3 Encounters:  06/14/17 157 lb (71.2 kg)  02/12/17 159 lb (72.1 kg)  10/15/16 161 lb (73 kg)   Constitutional: overweight, in NAD Eyes: PERRLA, EOMI, no exophthalmos ENT: moist mucous membranes, no thyromegaly, no cervical lymphadenopathy Cardiovascular: RRR, No MRG Respiratory: CTA B Gastrointestinal: abdomen soft, NT, ND, BS+ Musculoskeletal: no deformities, strength intact in all 4 Skin: moist, warm, no rashes Neurological: no tremor with outstretched hands, DTR normal in all 4  ASSESSMENT: 1. DM2, insulin-dependent, uncontrolled, without long complications, but with hyperglycemia  2. HL  PLAN:  1. Patient with long-standing, fairly well controlled DM, on oral DM meds + basal insulin low dose. Latest HbA1c was reviewed with pt: 7.4% per check at PCP's office. Since then, sugars improved >> only having hyperglycemia after dinner. She was not able to take the 5 mg Glipizide before dinner in last 2  mo as she could not cut the tab's but she got new tab's now and started this low dose again. No other changes in her regimen are necessary. - again discussed to write explanations about why the sugars after dinner may be high - I suggested to:  Patient Instructions  Please continue: - Levemir 15 units at bedtime - Metformin ER 1000 mg 2x a day, with meals - Glipizide XL 10 mg before b'fast and 1/2 of tablet (5 mg) before dinner.   Write down what you ate and if you took Glipizide before dinner if the sugars after dinner are high.  Please return in 4 months with your sugar log.   - today, HbA1c is 7.2% (slightly better). - continue checking sugars at different times of the day - check 1x a day, rotating checks - advised for yearly eye exams >> she is UTD - refuses flu shot >> will get this later - Return to clinic in 4 mo with sugar log   2. HL - reviewed latest Lipid panel from PCP >> excellent! - continue Lovastatin  Philemon Kingdom, MD PhD [facility]

## 2017-06-14 NOTE — Patient Instructions (Signed)
Please continue: - Levemir 15 units at bedtime - Metformin ER 1000 mg 2x a day, with meals - Glipizide XL 10 mg before b'fast and 1/2 of tablet (5 mg) before dinner.   Write down what you ate and if you took Glipizide before dinner if the sugars after dinner are high.  Please return in 4 months with your sugar log.

## 2017-06-26 DIAGNOSIS — L608 Other nail disorders: Secondary | ICD-10-CM | POA: Diagnosis not present

## 2017-06-26 DIAGNOSIS — M79645 Pain in left finger(s): Secondary | ICD-10-CM | POA: Diagnosis not present

## 2017-06-28 ENCOUNTER — Other Ambulatory Visit: Payer: Self-pay | Admitting: Orthopedic Surgery

## 2017-06-28 DIAGNOSIS — R208 Other disturbances of skin sensation: Secondary | ICD-10-CM | POA: Diagnosis not present

## 2017-06-28 DIAGNOSIS — L720 Epidermal cyst: Secondary | ICD-10-CM | POA: Diagnosis not present

## 2017-08-11 ENCOUNTER — Other Ambulatory Visit: Payer: Self-pay | Admitting: Internal Medicine

## 2017-08-13 DIAGNOSIS — I10 Essential (primary) hypertension: Secondary | ICD-10-CM | POA: Diagnosis not present

## 2017-08-13 DIAGNOSIS — Z7984 Long term (current) use of oral hypoglycemic drugs: Secondary | ICD-10-CM | POA: Diagnosis not present

## 2017-08-13 DIAGNOSIS — E119 Type 2 diabetes mellitus without complications: Secondary | ICD-10-CM | POA: Diagnosis not present

## 2017-08-13 DIAGNOSIS — Z85038 Personal history of other malignant neoplasm of large intestine: Secondary | ICD-10-CM | POA: Diagnosis not present

## 2017-08-13 DIAGNOSIS — K219 Gastro-esophageal reflux disease without esophagitis: Secondary | ICD-10-CM | POA: Diagnosis not present

## 2017-09-12 DIAGNOSIS — K3189 Other diseases of stomach and duodenum: Secondary | ICD-10-CM | POA: Diagnosis not present

## 2017-09-12 DIAGNOSIS — K449 Diaphragmatic hernia without obstruction or gangrene: Secondary | ICD-10-CM | POA: Diagnosis not present

## 2017-09-12 DIAGNOSIS — R12 Heartburn: Secondary | ICD-10-CM | POA: Diagnosis not present

## 2017-09-12 DIAGNOSIS — K5730 Diverticulosis of large intestine without perforation or abscess without bleeding: Secondary | ICD-10-CM | POA: Diagnosis not present

## 2017-09-12 DIAGNOSIS — Z85038 Personal history of other malignant neoplasm of large intestine: Secondary | ICD-10-CM | POA: Diagnosis not present

## 2017-10-14 ENCOUNTER — Encounter: Payer: Self-pay | Admitting: Internal Medicine

## 2017-10-14 ENCOUNTER — Ambulatory Visit (INDEPENDENT_AMBULATORY_CARE_PROVIDER_SITE_OTHER): Payer: Medicare Other | Admitting: Internal Medicine

## 2017-10-14 VITALS — BP 150/82 | HR 92 | Resp 16 | Ht 62.0 in | Wt 161.0 lb

## 2017-10-14 DIAGNOSIS — E1165 Type 2 diabetes mellitus with hyperglycemia: Secondary | ICD-10-CM | POA: Diagnosis not present

## 2017-10-14 DIAGNOSIS — E785 Hyperlipidemia, unspecified: Secondary | ICD-10-CM

## 2017-10-14 LAB — POCT GLYCOSYLATED HEMOGLOBIN (HGB A1C): HEMOGLOBIN A1C: 7.1

## 2017-10-14 NOTE — Patient Instructions (Addendum)
Please continue: - Levemir15 units at bedtime - Metformin ER 1000 mg 2x a day, with meals - Glipizide XL 10 mg before b'fast and 1/2 of tablet (5 mg) before dinner.   Please return in 6 months with your sugar log.

## 2017-10-14 NOTE — Progress Notes (Signed)
Patient ID: Beth [name], female   DOB: 1941-07-11, 77 y.o.   MRN: [id number]  HPI: Beth [name] is a 77 y.o.-year-old female, returning for f/u for DM2, dx in 1990s, insulin-dependent, uncontrolled, without complications. Last visit 4 mo ago.  Last hemoglobin A1c was: Lab Results  Component Value Date   HGBA1C 7.2 06/14/2017   HGBA1C 7.3 10/15/2016   HGBA1C 6.9 04/13/2016  01/16/2017: HbA1c 7.4% 01/25/2016: HbA1c 7.0%, glucose 73  Pt is on a regimen of: - Levemir 15 units at bedtime - Metformin ER 1000 mg 2x a day, with meals - Glipizide XL 10 mg before b'fast  Patient was previously on Januvia and then Victoza. Lipase was elevated, at 74 (0-59) on 07/08/2015, then 69 on 07/18/2015. She was on Invokana 100 mg in a.m. >> yeast infection.  Pt checks her sugars once a day: - am: 96-123 >> 96-125 >> 89-117, 124 >> 66, 89-139 - 2h after b'fast:  159 >> 123, 135 >> n/c - before lunch:   99-140 >> n/c >> 108-125 >> n/c - 2h after lunch:124-160, 219 >> 177 >> 169, 181 >> n/c - before dinner: 111-154 >> 115, 149 >> n/c - 2h after dinner: 173-221, 304, 314 >> 162-214, 262 >> 159-186, 211, 306 (Holidays) - bedtime: 134-192, 217 >> 133-170 >> n/c - nighttime: n/c >> 180, 212 >> n/c Lowest sugar was 96 >> 89 >> 66; she has hypoglycemia awareness at 70.  Highest sugar was 314 >> 262 x1 >> 306.  Glucometer: ReliOn  Pt's meals are: - Breakfast: scrambled eggs, toast or cereals or yoghurt - Lunch: crackers and cheese, carrots, apple - Dinner: meat + salad/veggies + starch - Snacks: 2-3: cashews, cheese and carrots, almost milk + graham crackers  -No CKD, last BUN/creatinine:  01/16/2017: BUN/creatinine 21/0.96, glucose 95 Lab Results  Component Value Date   BUN 24 (H) 09/22/2016   CREATININE 0.91 09/22/2016  On ramipril -+ HL; last set of lipids:  01/16/2017: 141/139/45/69 Lab Results  Component Value Date   CHOL 151 01/28/2015   HDL 37 01/28/2015   LDLCALC 71 01/28/2015   TRIG 216 (A) 01/28/2015  On Lovastatin. - last eye exam was 02/2017: No DR, no macular edema . Dr. Gershon Crane. -No numbness and tingling in her feet.   She has a history of colon cancer ~2000. She remembers that the mass perforated her colon at that time.  ROS: Constitutional: no weight gain/no weight loss, no fatigue, no subjective hyperthermia, no subjective hypothermia Eyes: no blurry vision, no xerophthalmia ENT: no sore throat, no nodules palpated in throat, no dysphagia, no odynophagia, no hoarseness Cardiovascular: no CP/no SOB/no palpitations/no leg swelling Respiratory: no cough/no SOB/no wheezing Gastrointestinal: no N/no V/no D/no C/no acid reflux Musculoskeletal: no muscle aches/no joint aches Skin: no rashes, no hair loss Neurological: no tremors/no numbness/no tingling/no dizziness  I reviewed pt's medications, allergies, PMH, social hx, family hx, and changes were documented in the history of present illness. Otherwise, unchanged from my initial visit note.  Past Medical History:  Diagnosis Date  . Arthritis    neck and all over  . Cancer ([place]) 1998   hx colon ca-chemo radiation; lung  . Chronic back pain   . Diabetes mellitus without complication ([place])   . Hyperlipemia   . Hypertension   . Sarcoidosis    of the eye  . Wears glasses   . Wears partial dentures    top and bottom partials   Past Surgical History:  Procedure Laterality  Date  . ABDOMINAL HYSTERECTOMY    . APPENDECTOMY    . CATARACT EXTRACTION, BILATERAL    . COLONOSCOPY     many  . COMBINED MEDIASTINOSCOPY AND BRONCHOSCOPY  2002   biopsy  . DG ARTHRO [facility]*  2011   ablasion rt thumb nailbed  . I&D EXTREMITY Right 07/23/2013   Procedure: IRRIGATION AND DEBRIDEMENT DIP JOINT RIGHT INDEX FINGER, EXCISE MUCOID CYST RIGHT INDEX FINGER  ;  Surgeon: Cammie Sickle., MD;  Location: [place];  Service: Orthopedics;  Laterality: Right;  . LIPOMA EXCISION    . MASS EXCISION Right  09/28/2014   Procedure: EXCISION MASS RIGHT MIDDLE FINGER;  Surgeon: Daryll Brod, MD;  Location: [place];  Service: Orthopedics;  Laterality: Right;  ANESTHESIA:  GENERAL, IV REGIONAL FAB  . NAILBED REPAIR Left 08/28/2016   Procedure: Ablation nail matrix left index finger andl left middle finger;  Surgeon: Daryll Brod, MD;  Location: [place];  Service: Orthopedics;  Laterality: Left;  . PARTIAL COLECTOMY  1998   cancer-rad/chemo  . SKIN FULL THICKNESS GRAFT Right 07/23/2013   Procedure: NAIL PLATE ABLATION WITH FULL THICKNESS SKIN GRAFT FROM RIGHT   MEDIAL ELBOW TO RIGHT LONG FINGER NAIL BED;  Surgeon: Cammie Sickle., MD;  Location: [place];  Service: Orthopedics;  Laterality: Right;  . SKIN FULL THICKNESS GRAFT Left 08/28/2016   Procedure: SKIN GRAFT FULL THICKNESS upper left arm;  Surgeon: Daryll Brod, MD;  Location: [place];  Service: Orthopedics;  Laterality: Left;   Social History   Social History  . Marital Status: Married    Spouse Name: N/A  . Number of Children: 3   Occupational History  . retired   Social History Main Topics  . Smoking status: Former Smoker -- 0.50 packs/day    Types: Cigarettes    Quit date: 07/22/1971  . Smokeless tobacco: Not on file  . Alcohol Use: No     Comment: rare  . Drug Use: No  No Known Allergies Family History  Problem Relation Age of Onset  . Cancer Mother        colon   . Hypertension Father   . CVA Father   . Obesity Brother    Current Outpatient Medications  Medication Sig Dispense Refill  . acetaminophen (TYLENOL) 500 MG tablet Take 500 mg by mouth every 6 (six) hours as needed for pain.    Marland Kitchen GLIPIZIDE XL 10 MG 24 hr tablet TAKE HALF TO 1 TABLET TWICE DAILY BEFORE MEALS 180 tablet 3  . glucose blood (ONE TOUCH TEST STRIPS) test strip 1 each by Other route daily. Use as instructed    . hydrochlorothiazide (HYDRODIURIL) 25 MG tablet Take 1 tablet (25 mg total)  by mouth daily. 30 tablet 0  . Insulin Pen Needle (RELION PEN NEEDLES) 32G X 4 MM MISC USE ONE  ONCE DAILY 100 each 4  . LEVEMIR FLEXTOUCH 100 UNIT/ML Pen INJECT 15 UNITS SUBCUTANEOUSLY ONCE DAILY AT BEDTIME 15 pen 2  . lovastatin (MEVACOR) 10 MG tablet Take 10 mg by mouth at bedtime.    . metFORMIN (GLUCOPHAGE) 1000 MG tablet TAKE ONE TABLET BY MOUTH TWICE DAILY WITH A MEALS 180 tablet 3  . Multiple Vitamin (MULTIVITAMIN) tablet Take 1 tablet by mouth daily.    . ramipril (ALTACE) 10 MG capsule Take 10 mg by mouth daily.     No current facility-administered medications for this visit.  PE: BP (!) 150/82 (BP Location: Right Arm, Patient Position: Sitting, Cuff Size: Small)   Pulse 92   Resp 16   Ht 5\' 2"  (1.575 m)   Wt 161 lb (73 kg)   SpO2 99%   BMI 29.45 kg/m  Body mass index is 29.45 kg/m. Wt Readings from Last 3 Encounters:  10/14/17 161 lb (73 kg)  06/14/17 157 lb (71.2 kg)  02/12/17 159 lb (72.1 kg)   Constitutional: overweight, in NAD Eyes: PERRLA, EOMI, no exophthalmos ENT: moist mucous membranes, no thyromegaly, no cervical lymphadenopathy Cardiovascular: tachycardia, RR, No MRG Respiratory: CTA B Gastrointestinal: abdomen soft, NT, ND, BS+ Musculoskeletal: no deformities, strength intact in all 4 Skin: moist, warm, no rashes Neurological: no tremor with outstretched hands, DTR normal in all 4  ASSESSMENT: 1. DM2, insulin-dependent, uncontrolled, without long complications, but with hyperglycemia  2. HL  PLAN:  1. Patient with long-standing, fairly well-controlled type 2 diabetes, on oral antidiabetic regimen and basal insulin.  Her sugars were slightly higher during the holidays and she was really concerned that her HbA1c would be higher today.  However, today, HbA1c is 7.1% (slightly better).  - Her HbA1c target for her due to age would be <7.4% - For now, we will continue the current regimen - she is not using the second dose of glipizide, but she likes to  have it available if she has a larger meal - I suggested to:  Patient Instructions  Please continue: - Levemir 15 units at bedtime - Metformin ER 1000 mg 2x a day, with meals - Glipizide XL 10 mg before b'fast and 1/2 of tablet (5 mg) before dinner.   Please return in 6 months with your sugar log.    - continue checking sugars at different times of the day - check 1x a day, rotating checks - advised for yearly eye exams >> she is UTD - Return to clinic in 3 mo with sugar log   2. HL -Reviewed latest lipid panel from PCP (12/2016): LDL excellent -Continue lovastatin; no side effects  Philemon Kingdom, MD PhD [facility] Endocrinology

## 2017-12-23 ENCOUNTER — Other Ambulatory Visit: Payer: Self-pay | Admitting: Orthopedic Surgery

## 2017-12-23 ENCOUNTER — Encounter (HOSPITAL_BASED_OUTPATIENT_CLINIC_OR_DEPARTMENT_OTHER): Payer: Self-pay | Admitting: *Deleted

## 2017-12-23 ENCOUNTER — Other Ambulatory Visit: Payer: Self-pay

## 2017-12-23 DIAGNOSIS — L608 Other nail disorders: Secondary | ICD-10-CM | POA: Diagnosis not present

## 2017-12-23 DIAGNOSIS — IMO0002 Reserved for concepts with insufficient information to code with codable children: Secondary | ICD-10-CM | POA: Insufficient documentation

## 2017-12-23 DIAGNOSIS — R2233 Localized swelling, mass and lump, upper limb, bilateral: Secondary | ICD-10-CM | POA: Diagnosis not present

## 2017-12-23 NOTE — Pre-Procedure Instructions (Signed)
Bring all medications. Pt have I stat 8 and EKG done tomorrow before surgery.

## 2017-12-24 ENCOUNTER — Encounter (HOSPITAL_BASED_OUTPATIENT_CLINIC_OR_DEPARTMENT_OTHER): Payer: Self-pay | Admitting: *Deleted

## 2017-12-24 ENCOUNTER — Ambulatory Visit (HOSPITAL_BASED_OUTPATIENT_CLINIC_OR_DEPARTMENT_OTHER): Payer: Medicare Other | Admitting: Anesthesiology

## 2017-12-24 ENCOUNTER — Ambulatory Visit (HOSPITAL_BASED_OUTPATIENT_CLINIC_OR_DEPARTMENT_OTHER)
Admission: RE | Admit: 2017-12-24 | Discharge: 2017-12-24 | Disposition: A | Discharge status: Home / Self Care | Payer: Medicare Other | Source: Ambulatory Visit | Attending: Orthopedic Surgery | Admitting: Orthopedic Surgery

## 2017-12-24 ENCOUNTER — Encounter (HOSPITAL_BASED_OUTPATIENT_CLINIC_OR_DEPARTMENT_OTHER)
Admission: RE | Disposition: A | Discharge status: Home / Self Care | Payer: Self-pay | Source: Ambulatory Visit | Attending: Orthopedic Surgery

## 2017-12-24 DIAGNOSIS — E119 Type 2 diabetes mellitus without complications: Secondary | ICD-10-CM | POA: Insufficient documentation

## 2017-12-24 DIAGNOSIS — M1990 Unspecified osteoarthritis, unspecified site: Secondary | ICD-10-CM | POA: Diagnosis not present

## 2017-12-24 DIAGNOSIS — E785 Hyperlipidemia, unspecified: Secondary | ICD-10-CM | POA: Diagnosis not present

## 2017-12-24 DIAGNOSIS — I10 Essential (primary) hypertension: Secondary | ICD-10-CM | POA: Insufficient documentation

## 2017-12-24 DIAGNOSIS — Z923 Personal history of irradiation: Secondary | ICD-10-CM | POA: Diagnosis not present

## 2017-12-24 DIAGNOSIS — Z79899 Other long term (current) drug therapy: Secondary | ICD-10-CM | POA: Diagnosis not present

## 2017-12-24 DIAGNOSIS — L720 Epidermal cyst: Secondary | ICD-10-CM | POA: Insufficient documentation

## 2017-12-24 DIAGNOSIS — Z888 Allergy status to other drugs, medicaments and biological substances status: Secondary | ICD-10-CM | POA: Insufficient documentation

## 2017-12-24 DIAGNOSIS — M720 Palmar fascial fibromatosis [Dupuytren]: Secondary | ICD-10-CM | POA: Insufficient documentation

## 2017-12-24 DIAGNOSIS — D8689 Sarcoidosis of other sites: Secondary | ICD-10-CM | POA: Insufficient documentation

## 2017-12-24 DIAGNOSIS — L602 Onychogryphosis: Secondary | ICD-10-CM | POA: Diagnosis not present

## 2017-12-24 DIAGNOSIS — Z9221 Personal history of antineoplastic chemotherapy: Secondary | ICD-10-CM | POA: Insufficient documentation

## 2017-12-24 DIAGNOSIS — L608 Other nail disorders: Secondary | ICD-10-CM | POA: Diagnosis not present

## 2017-12-24 DIAGNOSIS — Z85038 Personal history of other malignant neoplasm of large intestine: Secondary | ICD-10-CM | POA: Insufficient documentation

## 2017-12-24 DIAGNOSIS — Z794 Long term (current) use of insulin: Secondary | ICD-10-CM | POA: Insufficient documentation

## 2017-12-24 DIAGNOSIS — Z87891 Personal history of nicotine dependence: Secondary | ICD-10-CM | POA: Insufficient documentation

## 2017-12-24 DIAGNOSIS — M47812 Spondylosis without myelopathy or radiculopathy, cervical region: Secondary | ICD-10-CM | POA: Insufficient documentation

## 2017-12-24 DIAGNOSIS — R2232 Localized swelling, mass and lump, left upper limb: Secondary | ICD-10-CM | POA: Diagnosis present

## 2017-12-24 HISTORY — PX: MASS EXCISION: SHX2000

## 2017-12-24 LAB — POCT I-STAT, CHEM 8
BUN: 24 mg/dL — AB (ref 6–20)
CHLORIDE: 102 mmol/L (ref 101–111)
Calcium, Ion: 1.27 mmol/L (ref 1.15–1.40)
Creatinine, Ser: 1.1 mg/dL — ABNORMAL HIGH (ref 0.44–1.00)
Glucose, Bld: 128 mg/dL — ABNORMAL HIGH (ref 65–99)
HEMATOCRIT: 37 % (ref 36.0–46.0)
Hemoglobin: 12.6 g/dL (ref 12.0–15.0)
Potassium: 4.4 mmol/L (ref 3.5–5.1)
SODIUM: 141 mmol/L (ref 135–145)
TCO2: 27 mmol/L (ref 22–32)

## 2017-12-24 LAB — GLUCOSE, CAPILLARY: GLUCOSE-CAPILLARY: 151 mg/dL — AB (ref 65–99)

## 2017-12-24 SURGERY — EXCISION MASS
Anesthesia: General | Site: Hand | Laterality: Bilateral

## 2017-12-24 MED ORDER — CEFAZOLIN SODIUM-DEXTROSE 2-4 GM/100ML-% IV SOLN
2.0000 g | INTRAVENOUS | Status: AC
  Administered 2017-12-24: 2 g via INTRAVENOUS

## 2017-12-24 MED ORDER — BUPIVACAINE HCL (PF) 0.25 % IJ SOLN
INTRAMUSCULAR | Status: DC | PRN
  Administered 2017-12-24: 6 mL

## 2017-12-24 MED ORDER — DEXAMETHASONE SODIUM PHOSPHATE 10 MG/ML IJ SOLN
INTRAMUSCULAR | Status: DC | PRN
  Administered 2017-12-24: 4 mg via INTRAVENOUS

## 2017-12-24 MED ORDER — PROMETHAZINE HCL 25 MG/ML IJ SOLN: 6.2500 mg | INTRAMUSCULAR | Status: DC | PRN

## 2017-12-24 MED ORDER — FENTANYL CITRATE (PF) 100 MCG/2ML IJ SOLN
INTRAMUSCULAR | Status: AC
Start: 2017-12-24 — End: 2017-12-24
  Filled 2017-12-24: qty 2

## 2017-12-24 MED ORDER — LIDOCAINE HCL (CARDIAC) 20 MG/ML IV SOLN
INTRAVENOUS | Status: DC | PRN
  Administered 2017-12-24: 50 mg via INTRAVENOUS

## 2017-12-24 MED ORDER — MIDAZOLAM HCL 2 MG/2ML IJ SOLN
INTRAMUSCULAR | Status: AC
  Filled 2017-12-24: qty 2

## 2017-12-24 MED ORDER — HYDROCODONE-ACETAMINOPHEN 5-325 MG PO TABS: 1.0000 | ORAL_TABLET | Freq: Four times a day (QID) | ORAL | 0 refills | Status: DC | PRN

## 2017-12-24 MED ORDER — SCOPOLAMINE 1 MG/3DAYS TD PT72: 1.0000 | MEDICATED_PATCH | Freq: Once | TRANSDERMAL | Status: DC | PRN

## 2017-12-24 MED ORDER — ROCURONIUM BROMIDE 10 MG/ML (PF) SYRINGE
PREFILLED_SYRINGE | INTRAVENOUS | Status: AC
  Filled 2017-12-24: qty 5

## 2017-12-24 MED ORDER — PROPOFOL 10 MG/ML IV BOLUS
INTRAVENOUS | Status: DC | PRN
  Administered 2017-12-24: 150 mg via INTRAVENOUS

## 2017-12-24 MED ORDER — MIDAZOLAM HCL 2 MG/2ML IJ SOLN: 1.0000 mg | INTRAMUSCULAR | Status: DC | PRN

## 2017-12-24 MED ORDER — PHENYLEPHRINE 40 MCG/ML (10ML) SYRINGE FOR IV PUSH (FOR BLOOD PRESSURE SUPPORT)
PREFILLED_SYRINGE | INTRAVENOUS | Status: AC
  Filled 2017-12-24: qty 10

## 2017-12-24 MED ORDER — FENTANYL CITRATE (PF) 100 MCG/2ML IJ SOLN
50.0000 ug | INTRAMUSCULAR | Status: DC | PRN
  Administered 2017-12-24: 50 ug via INTRAVENOUS

## 2017-12-24 MED ORDER — LACTATED RINGERS IV SOLN
INTRAVENOUS | Status: DC
  Administered 2017-12-24 (×2): via INTRAVENOUS

## 2017-12-24 MED ORDER — PHENYLEPHRINE HCL 10 MG/ML IJ SOLN
INTRAVENOUS | Status: DC | PRN
  Administered 2017-12-24: 40 ug/min via INTRAVENOUS

## 2017-12-24 MED ORDER — EPHEDRINE SULFATE 50 MG/ML IJ SOLN
INTRAMUSCULAR | Status: DC | PRN
  Administered 2017-12-24: 15 mg via INTRAVENOUS

## 2017-12-24 MED ORDER — CHLORHEXIDINE GLUCONATE 4 % EX LIQD: 60.0000 mL | Freq: Once | CUTANEOUS | Status: DC

## 2017-12-24 MED ORDER — CEFAZOLIN SODIUM-DEXTROSE 2-4 GM/100ML-% IV SOLN
INTRAVENOUS | Status: AC
  Filled 2017-12-24: qty 100

## 2017-12-24 MED ORDER — ONDANSETRON HCL 4 MG/2ML IJ SOLN
INTRAMUSCULAR | Status: AC
  Filled 2017-12-24: qty 2

## 2017-12-24 MED ORDER — FENTANYL CITRATE (PF) 100 MCG/2ML IJ SOLN: 25.0000 ug | INTRAMUSCULAR | Status: DC | PRN

## 2017-12-24 MED ORDER — EPHEDRINE 5 MG/ML INJ
INTRAVENOUS | Status: AC
  Filled 2017-12-24: qty 10

## 2017-12-24 MED ORDER — SUCCINYLCHOLINE CHLORIDE 200 MG/10ML IV SOSY
PREFILLED_SYRINGE | INTRAVENOUS | Status: AC
  Filled 2017-12-24: qty 10

## 2017-12-24 SURGICAL SUPPLY — 51 items
BANDAGE COBAN STERILE 2 (GAUZE/BANDAGES/DRESSINGS) IMPLANT
BLADE MINI RND TIP GREEN BEAV (BLADE) ×1 IMPLANT
BLADE SURG 15 STRL LF DISP TIS (BLADE) ×1 IMPLANT
BLADE SURG 15 STRL SS (BLADE) ×2
BNDG CMPR 9X4 STRL LF SNTH (GAUZE/BANDAGES/DRESSINGS) ×1
BNDG COHESIVE 1X5 TAN STRL LF (GAUZE/BANDAGES/DRESSINGS) ×3 IMPLANT
BNDG COHESIVE 3X5 TAN STRL LF (GAUZE/BANDAGES/DRESSINGS) IMPLANT
BNDG ESMARK 4X9 LF (GAUZE/BANDAGES/DRESSINGS) ×1 IMPLANT
BNDG GAUZE ELAST 4 BULKY (GAUZE/BANDAGES/DRESSINGS) IMPLANT
CHLORAPREP W/TINT 26ML (MISCELLANEOUS) ×3 IMPLANT
CORD BIPOLAR FORCEPS 12FT (ELECTRODE) ×2 IMPLANT
COVER BACK TABLE 60X90IN (DRAPES) ×2 IMPLANT
COVER MAYO STAND STRL (DRAPES) ×3 IMPLANT
CUFF TOURNIQUET SINGLE 18IN (TOURNIQUET CUFF) ×1 IMPLANT
DECANTER SPIKE VIAL GLASS SM (MISCELLANEOUS) IMPLANT
DRAIN PENROSE 1/2X12 LTX STRL (WOUND CARE) IMPLANT
DRAPE EXTREMITY T 121X128X90 (DRAPE) ×3 IMPLANT
DRAPE SURG 17X23 STRL (DRAPES) ×3 IMPLANT
GAUZE SPONGE 4X4 12PLY STRL (GAUZE/BANDAGES/DRESSINGS) ×2 IMPLANT
GAUZE XEROFORM 1X8 LF (GAUZE/BANDAGES/DRESSINGS) ×2 IMPLANT
GLOVE BIOGEL PI IND STRL 7.0 (GLOVE) IMPLANT
GLOVE BIOGEL PI IND STRL 8.5 (GLOVE) ×1 IMPLANT
GLOVE BIOGEL PI INDICATOR 7.0 (GLOVE) ×3
GLOVE BIOGEL PI INDICATOR 8.5 (GLOVE) ×1
GLOVE ECLIPSE 6.5 STRL STRAW (GLOVE) ×1 IMPLANT
GLOVE SURG ORTHO 8.0 STRL STRW (GLOVE) ×2 IMPLANT
GLOVE SURG SS PI 6.5 STRL IVOR (GLOVE) ×1 IMPLANT
GOWN STRL REUS W/ TWL LRG LVL3 (GOWN DISPOSABLE) ×1 IMPLANT
GOWN STRL REUS W/TWL LRG LVL3 (GOWN DISPOSABLE) ×4
GOWN STRL REUS W/TWL XL LVL3 (GOWN DISPOSABLE) ×2 IMPLANT
NDL PRECISIONGLIDE 27X1.5 (NEEDLE) ×1 IMPLANT
NDL SAFETY ECLIPSE 18X1.5 (NEEDLE) IMPLANT
NEEDLE HYPO 18GX1.5 SHARP (NEEDLE)
NEEDLE PRECISIONGLIDE 27X1.5 (NEEDLE) ×2 IMPLANT
NS IRRIG 1000ML POUR BTL (IV SOLUTION) ×2 IMPLANT
PACK BASIN DAY SURGERY FS (CUSTOM PROCEDURE TRAY) ×2 IMPLANT
PAD CAST 3X4 CTTN HI CHSV (CAST SUPPLIES) IMPLANT
PADDING CAST COTTON 3X4 STRL (CAST SUPPLIES)
SPLINT FINGER 3.25 BULB 911905 (SOFTGOODS) ×2 IMPLANT
SPLINT FINGER 5.25 BULB (SOFTGOODS) ×1 IMPLANT
SPLINT PLASTER CAST XFAST 3X15 (CAST SUPPLIES) IMPLANT
SPLINT PLASTER XTRA FASTSET 3X (CAST SUPPLIES)
STOCKINETTE 4X48 STRL (DRAPES) ×3 IMPLANT
SUT ETHILON 4 0 PS 2 18 (SUTURE) ×2 IMPLANT
SUT ETHILON 5 0 P 3 18 (SUTURE) ×1
SUT NYLON ETHILON 5-0 P-3 1X18 (SUTURE) IMPLANT
SUT VIC AB 4-0 P2 18 (SUTURE) IMPLANT
SYR BULB 3OZ (MISCELLANEOUS) ×2 IMPLANT
SYR CONTROL 10ML LL (SYRINGE) ×2 IMPLANT
TOWEL OR 17X24 6PK STRL BLUE (TOWEL DISPOSABLE) ×2 IMPLANT
UNDERPAD 30X30 (UNDERPADS AND DIAPERS) ×1 IMPLANT

## 2017-12-24 NOTE — Brief Op Note (Signed)
12/24/2017  11:19 AM  PATIENT:  Beth [name]  77 y.o. female  PRE-OPERATIVE DIAGNOSIS:  MASS RIGHT SMALL FINGER, MASS LEFT INDEX,NAIL LEFT MIDDLE  POST-OPERATIVE DIAGNOSIS:  MASS RIGHT SMALL FINGER, MASS LEFT INDEX,NAIL LEFT MIDDLE  PROCEDURE:  Procedure(s) with comments: EXCISION MASS RIGHT SMALL, LEFT INDEX, excision left nail horn middle finger (Bilateral) - FAB  SURGEON:  Surgeon(s) and Role:    Daryll Brod, MD - Primary  PHYSICIAN ASSISTANT:   ASSISTANTS: none   ANESTHESIA:   local and general  EBL:  3 mL   BLOOD ADMINISTERED:none  DRAINS: none   LOCAL MEDICATIONS USED:  BUPIVICAINE   SPECIMEN:  Excision  DISPOSITION OF SPECIMEN:  PATHOLOGY  COUNTS:  YES  TOURNIQUET:   Total Tourniquet Time Documented: Forearm (Left) - 16 minutes Total: Forearm (Left) - 16 minutes  Forearm (Right) - 14 minutes Total: Forearm (Right) - 14 minutes   DICTATION: .Other Dictation: Dictation Number [id number]  PLAN OF CARE: Discharge to home after PACU  PATIENT DISPOSITION:  PACU - hemodynamically stable.

## 2017-12-24 NOTE — Discharge Instructions (Addendum)

## 2017-12-24 NOTE — H&P (Signed)
Chief Complaint: masses both hands OHY:WVPXT S [place] nail bed graft index and long fingers left hand. She has had progressive swelling of the radial aspect of her index and ulnar aspect of her small at the proximal corners of each of the digits. This where the grafts are. Is she states this has gradually enlarged is causing her pain if she hits the area. Moderate in nature. Is not having any discomfort otherwise. She has not had any drainage for this. She feels that it is gradually enlarging. She has a history of di review of systems is positive for cancer glasses and high blood pressure and Diabetes but no history of thyroid problems arthritis gout. Family history is positive diabetes negative for thyroid problems arthritis and gout.      Past Medical History:  Diagnosis Date  . Arthritis    neck and all over  . Cancer ([place]) 1998   hx colon ca-chemo radiation; lung  . Chronic back pain   . Diabetes mellitus without complication ([place])   . Hyperlipemia   . Hypertension   . Sarcoidosis    of the eye  . Wears glasses   . Wears partial dentures    top and bottom partials    Past Surgical History:  Procedure Laterality Date  . ABDOMINAL HYSTERECTOMY    . APPENDECTOMY    . CATARACT EXTRACTION, BILATERAL    . COLONOSCOPY     many  . COMBINED MEDIASTINOSCOPY AND BRONCHOSCOPY  2002   biopsy  . DG ARTHRO [facility]*  2011   ablasion rt thumb nailbed  . I&D EXTREMITY Right 07/23/2013   Procedure: IRRIGATION AND DEBRIDEMENT DIP JOINT RIGHT INDEX FINGER, EXCISE MUCOID CYST RIGHT INDEX FINGER  ;  Surgeon: Cammie Sickle., MD;  Location: [place];  Service: Orthopedics;  Laterality: Right;  . LIPOMA EXCISION    . MASS EXCISION Right 09/28/2014   Procedure: EXCISION MASS RIGHT MIDDLE FINGER;  Surgeon: Daryll Brod, MD;  Location: [place];  Service: Orthopedics;  Laterality: Right;  ANESTHESIA:  GENERAL, IV REGIONAL FAB  . NAILBED REPAIR Left 08/28/2016   Procedure: Ablation nail matrix left index finger andl left middle finger;  Surgeon: Daryll Brod, MD;  Location: [place];  Service: Orthopedics;  Laterality: Left;  . PARTIAL COLECTOMY  1998   cancer-rad/chemo  . SKIN FULL THICKNESS GRAFT Right 07/23/2013   Procedure: NAIL PLATE ABLATION WITH FULL THICKNESS SKIN GRAFT FROM RIGHT   MEDIAL ELBOW TO RIGHT LONG FINGER NAIL BED;  Surgeon: Cammie Sickle., MD;  Location: [place];  Service: Orthopedics;  Laterality: Right;  . SKIN FULL THICKNESS GRAFT Left 08/28/2016   Procedure: SKIN GRAFT FULL THICKNESS upper left arm;  Surgeon: Daryll Brod, MD;  Location: [place];  Service: Orthopedics;  Laterality: Left;    Family History  Problem Relation Age of Onset  . Cancer Mother        colon   . Hypertension Father   . CVA Father   . Obesity Brother    Social History:  reports that she quit smoking about 46 years ago. Her smoking use included cigarettes. She smoked 0.50 packs per day. She has never used smokeless tobacco. She reports that she does not drink alcohol or use drugs.  Allergies: No Known Allergies  No medications prior to admission.    No results found for this or any previous visit (from the past 48 hour(s)).  No results  found.   Pertinent items are noted in HPI.  Height 5\' 1"  (1.549 m), weight 72.1 kg (159 lb).  General appearance: alert, cooperative and appears stated age Head: Normocephalic, without obvious abnormality Neck: no JVD Resp: clear to auscultation bilaterally Cardio: regular rate and rhythm, S1, S2 normal, no murmur, click, rub or gallop GI: soft, non-tender; bowel sounds normal; no masses,  no organomegaly Extremities: mass right smal, and left index and middle fingers Pulses: 2+ and symmetric Skin: Skin color, texture, turgor normal. No rashes or lesions Neurologic: Grossly normal Incision/Wound: na  Assessment/Plan  Diagnosis is nail horns left  index left middle fingers. We have discussed with her and her husband excision of these with cauterization. She is allergic to chromic sutures. We will see if she can tolerate regular catgut sutures or Steri-Strips. He is where there is no guarantee to the surgery. She is scheduled as an outpatient under regional anesthesia for excision nail horns left index lateral left middle finger.      Guerin Lashomb R 12/24/2017, 8:21 AM

## 2017-12-24 NOTE — Anesthesia Preprocedure Evaluation (Signed)
Anesthesia Evaluation  Patient identified by MRN, date of birth, ID band Patient awake    Reviewed: Allergy & Precautions, NPO status , Patient's Chart, lab work & pertinent test results  History of Anesthesia Complications Negative for: history of anesthetic complications  Airway Mallampati: II  TM Distance: >3 FB Neck ROM: Full    Dental  (+) Partial Lower, Partial Upper   Pulmonary former smoker,    breath sounds clear to auscultation       Cardiovascular hypertension, Pt. on medications  Rhythm:Regular Rate:Normal     Neuro/Psych negative neurological ROS  negative psych ROS   GI/Hepatic negative GI ROS, Neg liver ROS,   Endo/Other  diabetes, Type 2, Oral Hypoglycemic Agents  Renal/GU negative Renal ROS  negative genitourinary   Musculoskeletal  (+) Arthritis , Osteoarthritis,    Abdominal   Peds negative pediatric ROS (+)  Hematology negative hematology ROS (+)   Anesthesia Other Findings   Reproductive/Obstetrics negative OB ROS                             Lab Results  Component Value Date   HGB 13.5 09/28/2014   Lab Results  Component Value Date   CREATININE 0.91 09/22/2016   BUN 24 (H) 09/22/2016   NA 140 09/22/2016   K 4.5 09/22/2016   CL 106 09/22/2016   CO2 25 09/22/2016   No results found for: INR, PROTIME  08/2016 EKG: normal sinus rhythm.   Anesthesia Physical  Anesthesia Plan  ASA: II  Anesthesia Plan: General   Post-op Pain Management:    Induction: Intravenous  PONV Risk Score and Plan: 2 and Ondansetron, Dexamethasone and Diphenhydramine  Airway Management Planned: Natural Airway  Additional Equipment:   Intra-op Plan:   Post-operative Plan: Extubation in OR  Informed Consent: I have reviewed the patients History and Physical, chart, labs and discussed the procedure including the risks, benefits and alternatives for the proposed  anesthesia with the patient or authorized representative who has indicated his/her understanding and acceptance.   Dental advisory given  Plan Discussed with: CRNA  Anesthesia Plan Comments:         Anesthesia Quick Evaluation

## 2017-12-24 NOTE — Op Note (Signed)
Beth [name], Beth [name]                ACCOUNT NO.:  [id number]  MEDICAL RECORD NO.:  [id number]  LOCATION:                                 FACILITY:  PHYSICIAN:  Daryll Brod, M.D.            DATE OF BIRTH:  DATE OF PROCEDURE:  12/24/2017 DATE OF DISCHARGE:                              OPERATIVE REPORT   PREOPERATIVE DIAGNOSIS:  Mass, left index, left middle, and right small fingers.  POSTOPERATIVE DIAGNOSIS:  Mass, left index, left middle, and right small fingers.  OPERATION:  Excisional biopsies, mass left index, middle, and right small fingers.  SURGEON:  Daryll Brod, M.D.  ASSISTANT:  None.  ANESTHESIA:  General.  PLACE OF SURGERY:  [facility].  ANESTHESIOLOGIST:  Beth [name]. Beth [name], M.D.  HISTORY:  The patient is a 77 year old female with mass on the ulnar aspect of her right small finger, which is painful for her.  She has a small mass at the scar of the nail bed excision and skin grafting of her right index finger and a nail horn on her left middle finger. She is admitted for excision of each of these particular problems.  Pre, peri, and postoperative course have been discussed along with risks and complications.  She is aware there is no guarantee to the surgery; the possibility of infection; recurrence of injury to arteries, nerves, tendons; incomplete relief of symptoms; dystrophy.  In the preoperative area, the patient was seen, the extremity marked by both patient and surgeon.  Antibiotic given.  DESCRIPTION OF PROCEDURE:  The patient was brought to the operating room, where general anesthetic was carried out without difficulty under the direction of Dr. Tobias [name].  She was prepped using ChloraPrep in a supine position, both arms free.  Three-minute dry time was allowed. Time-out taken, confirming the patient and procedure.  The left hand was addressed first.  The limb was exsanguinated with an Esmarch bandage. Tourniquet placed on forearm was inflated to  250 mmHg.  An incision was made over the dorsal aspect of the index finger nail bed.  Carried down through subcutaneous tissue.  A chalky white substance was immediately extruded from the mass.  With blunt and sharp dissection, this was dissected free as a bulbous mass and sent to Pathology.  This occurred all the way down to the bone.  This appeared to be an epidermal inclusion cyst.  The wound was irrigated.  The skin closed with interrupted 5-0 nylon sutures.  Elliptical incision was then made around the nail horn of the middle finger.  This was carried around the matrix without excising the nail horn itself.  This was taken all the way down to the bone.  It was excised and sent to pathology in toto.  The area of the bed was then cauterized with bipolar.  The wound was irrigated, and the skin closed with interrupted 5-0 nylon sutures.  Sterile compressive dressings were applied to the fingers.  A metacarpal block was given with 0.25% bupivacaine without epinephrine, approximately 2-3 mL was used for each digit.  The tourniquet was deflated.  The remaining fingers pinked.  The right hand was  attended to next.  The limb was exsanguinated with an Esmarch bandage after time-out taken.  A tourniquet placed on the forearm and inflated to 250 mmHg.  A volar Brunner-type incision was made obliquely over the middle phalanx and midlateral line to the proximal phalanx and then obliquely onto the dorsal palm.  This was carried down through subcutaneous tissue.  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  The neurovascular bundle was identified, protected, and the cord excised in total.  This was sent to Pathology.  The entire cord measured over the entire proximal phalanx to the middle phalanx.  The wound was irrigated, and the incision closed with interrupted 4-0 nylon sutures.  A metacarpal block was given  to that digit using approximately 3 mL of bupivacaine 0.25% without epinephrine.  A sterile compressive dressings splint was applied to each of the digits.  She was taken to the recovery room for observation in satisfactory condition.  She will be discharged home to return to the [facility] in 1 week, on Norco.          ______________________________ Daryll Brod, M.D.     GK/MEDQ  D:  12/24/2017  T:  12/24/2017  Job:  [id number]

## 2017-12-24 NOTE — Anesthesia Procedure Notes (Signed)
Procedure Name: LMA Insertion Date/Time: 12/24/2017 10:29 AM Performed by: Willa Frater, CRNA Pre-anesthesia Checklist: Patient identified, Emergency Drugs available, Suction available and Patient being monitored Patient Re-evaluated:Patient Re-evaluated prior to induction Oxygen Delivery Method: Circle system utilized Preoxygenation: Pre-oxygenation with 100% oxygen Induction Type: IV induction Ventilation: Mask ventilation without difficulty LMA: LMA inserted LMA Size: 4.0 Number of attempts: 1 Airway Equipment and Method: Bite block Placement Confirmation: positive ETCO2 Tube secured with: Tape Dental Injury: Teeth and Oropharynx as per pre-operative assessment

## 2017-12-24 NOTE — Transfer of Care (Signed)
Immediate Anesthesia Transfer of Care Note  Patient: Beth [name]  Procedure(s) Performed: EXCISION MASS RIGHT SMALL, LEFT INDEX, excision left nail horn middle finger (Bilateral Hand)  Patient Location: PACU  Anesthesia Type:General  Level of Consciousness: sedated  Airway & Oxygen Therapy: Patient Spontanous Breathing and Patient connected to face mask oxygen  Post-op Assessment: Report given to RN and Post -op Vital signs reviewed and stable  Post vital signs: Reviewed and stable  Last Vitals:  Vitals Value Taken Time  BP    Temp    Pulse    Resp    SpO2      Last Pain:  Vitals:   12/24/17 0929  TempSrc: Oral      Patients Stated Pain Goal: 0 (88/11/03 1594)  Complications: No apparent anesthesia complications

## 2017-12-24 NOTE — Op Note (Signed)
Other Dictation: Dictation Number [id number]

## 2017-12-25 ENCOUNTER — Encounter (HOSPITAL_BASED_OUTPATIENT_CLINIC_OR_DEPARTMENT_OTHER): Payer: Self-pay | Admitting: Orthopedic Surgery

## 2017-12-25 NOTE — Anesthesia Postprocedure Evaluation (Signed)
Anesthesia Post Note  Patient: Beth [name]  Procedure(s) Performed: EXCISION MASS RIGHT SMALL, LEFT INDEX, excision left nail horn middle finger (Bilateral Hand)     Patient location during evaluation: PACU Anesthesia Type: General Level of consciousness: sedated Pain management: pain level controlled Vital Signs Assessment: post-procedure vital signs reviewed and stable Respiratory status: spontaneous breathing and respiratory function stable Cardiovascular status: stable Postop Assessment: no apparent nausea or vomiting Anesthetic complications: no    Last Vitals:  Vitals:   12/24/17 1145 12/24/17 1225  BP: (!) 144/58 (!) 155/60  Pulse: 91 91  Resp: 13 16  Temp:  36.9 C  SpO2: 93% 94%    Last Pain:  Vitals:   12/24/17 1225  TempSrc: Oral  PainSc: 0-No pain                 Norah Devin DANIEL

## 2018-02-21 DIAGNOSIS — N189 Chronic kidney disease, unspecified: Secondary | ICD-10-CM | POA: Diagnosis not present

## 2018-02-21 DIAGNOSIS — Z1389 Encounter for screening for other disorder: Secondary | ICD-10-CM | POA: Diagnosis not present

## 2018-02-21 DIAGNOSIS — R8290 Unspecified abnormal findings in urine: Secondary | ICD-10-CM | POA: Diagnosis not present

## 2018-02-21 DIAGNOSIS — E118 Type 2 diabetes mellitus with unspecified complications: Secondary | ICD-10-CM | POA: Diagnosis not present

## 2018-02-21 DIAGNOSIS — I10 Essential (primary) hypertension: Secondary | ICD-10-CM | POA: Diagnosis not present

## 2018-02-21 DIAGNOSIS — M85839 Other specified disorders of bone density and structure, unspecified forearm: Secondary | ICD-10-CM | POA: Diagnosis not present

## 2018-02-21 DIAGNOSIS — Z85038 Personal history of other malignant neoplasm of large intestine: Secondary | ICD-10-CM | POA: Diagnosis not present

## 2018-02-21 DIAGNOSIS — Z1331 Encounter for screening for depression: Secondary | ICD-10-CM | POA: Diagnosis not present

## 2018-02-21 DIAGNOSIS — E782 Mixed hyperlipidemia: Secondary | ICD-10-CM | POA: Diagnosis not present

## 2018-02-27 DIAGNOSIS — E119 Type 2 diabetes mellitus without complications: Secondary | ICD-10-CM | POA: Diagnosis not present

## 2018-02-27 DIAGNOSIS — Z961 Presence of intraocular lens: Secondary | ICD-10-CM | POA: Diagnosis not present

## 2018-02-27 DIAGNOSIS — Z794 Long term (current) use of insulin: Secondary | ICD-10-CM | POA: Diagnosis not present

## 2018-03-22 DIAGNOSIS — M545 Low back pain, unspecified: Secondary | ICD-10-CM | POA: Insufficient documentation

## 2018-03-22 DIAGNOSIS — M25551 Pain in right hip: Secondary | ICD-10-CM | POA: Diagnosis not present

## 2018-03-22 DIAGNOSIS — M542 Cervicalgia: Secondary | ICD-10-CM | POA: Insufficient documentation

## 2018-03-28 ENCOUNTER — Other Ambulatory Visit: Payer: Self-pay | Admitting: Orthopedic Surgery

## 2018-03-28 DIAGNOSIS — M48061 Spinal stenosis, lumbar region without neurogenic claudication: Secondary | ICD-10-CM | POA: Diagnosis not present

## 2018-03-28 DIAGNOSIS — M545 Low back pain: Secondary | ICD-10-CM | POA: Diagnosis not present

## 2018-03-31 ENCOUNTER — Other Ambulatory Visit: Payer: Self-pay | Admitting: Internal Medicine

## 2018-04-09 ENCOUNTER — Ambulatory Visit
Admission: RE | Admit: 2018-04-09 | Discharge: 2018-04-09 | Disposition: A | Discharge status: Home / Self Care | Payer: Medicare Other | Source: Ambulatory Visit | Attending: Orthopedic Surgery | Admitting: Orthopedic Surgery

## 2018-04-09 DIAGNOSIS — M48061 Spinal stenosis, lumbar region without neurogenic claudication: Secondary | ICD-10-CM

## 2018-04-09 MED ORDER — IOPAMIDOL (ISOVUE-M 200) INJECTION 41%
15.0000 mL | Freq: Once | INTRAMUSCULAR | Status: AC
  Administered 2018-04-09: 15 mL via INTRATHECAL

## 2018-04-09 MED ORDER — DIAZEPAM 5 MG PO TABS
5.0000 mg | ORAL_TABLET | Freq: Once | ORAL | Status: AC
  Administered 2018-04-09: 5 mg via ORAL

## 2018-04-09 NOTE — Discharge Instructions (Signed)

## 2018-04-15 ENCOUNTER — Encounter: Payer: Self-pay | Admitting: Internal Medicine

## 2018-04-15 ENCOUNTER — Ambulatory Visit (INDEPENDENT_AMBULATORY_CARE_PROVIDER_SITE_OTHER): Payer: Medicare Other | Admitting: Internal Medicine

## 2018-04-15 VITALS — BP 162/90 | HR 82 | Ht 62.0 in | Wt 158.0 lb

## 2018-04-15 DIAGNOSIS — E1165 Type 2 diabetes mellitus with hyperglycemia: Secondary | ICD-10-CM | POA: Diagnosis not present

## 2018-04-15 DIAGNOSIS — E785 Hyperlipidemia, unspecified: Secondary | ICD-10-CM

## 2018-04-15 DIAGNOSIS — E663 Overweight: Secondary | ICD-10-CM | POA: Insufficient documentation

## 2018-04-15 DIAGNOSIS — I10 Essential (primary) hypertension: Secondary | ICD-10-CM | POA: Diagnosis not present

## 2018-04-15 DIAGNOSIS — M545 Low back pain: Secondary | ICD-10-CM | POA: Diagnosis not present

## 2018-04-15 LAB — POCT GLYCOSYLATED HEMOGLOBIN (HGB A1C): HEMOGLOBIN A1C: 7.1 % — AB (ref 4.0–5.6)

## 2018-04-15 MED ORDER — INSULIN DETEMIR 100 UNIT/ML FLEXPEN: PEN_INJECTOR | SUBCUTANEOUS | 2 refills | Status: DC

## 2018-04-15 NOTE — Patient Instructions (Addendum)
Please decrease: - Levemir to 12 units at bedtime  Please continue: - Metformin 1000 mg 2x a day, with meals - Glipizide XL 10 mg before b'fast and 1/2 of tablet (5 mg) before dinner.   Please return in  4 months with your sugar log.

## 2018-04-15 NOTE — Progress Notes (Signed)
Patient ID: Beth [name], female   DOB: 05/11/1941, 77 y.o.   MRN: [id number]  HPI: Beth [name] is a 77 y.o.-year-old female, returning for f/u for DM2, dx in 1990s, insulin-dependent, uncontrolled, without complications. Last visit 6 months ago.  She had a recent myelogram 1 week ago >> spinal stenosis. She was on Prednisone >> sugars slightly higher.  Unfortunately, she may need to have back surgery or back injections.  She is not sure yet which treatment will be chosen.  Last hemoglobin A1c was: Lab Results  Component Value Date   HGBA1C 7.1 10/14/2017   HGBA1C 7.2 06/14/2017   HGBA1C 7.3 10/15/2016  01/16/2017: HbA1c 7.4% 01/25/2016: HbA1c 7.0%, glucose 73  Pt is on a regimen of:ue: - Levemir 12-15 units at bedtime - Metformin 1000 mg 2x a day, with meals - Glipizide XL 10 mg before b'fast and 1/2 of tablet (5 mg) before dinner- uses this dose rarely  Patient was previously on Januvia and then Victoza. Lipase was elevated, at 74 (0-59) on 07/08/2015, then 69 on 07/18/2015. She was on Invokana 100 mg in a.m. >> yeast infection.  Pt checks her sugars once a day: - am:  96-125 >> 89-117, 124 >> 66, 89-139 >> 88-131, 147 (steroids) - 2h after b'fast:  159 >> 123, 135 >> n/c - before lunch:   99-140 >> n/c >> 108-125 >> n/c >> 84, 145 (steroids) - 2h after lunch:124-160, 219 >> 177 >> 169, 181 >> n/c - before dinner: 111-154 >> 115, 149 >> n/c >> 115, 167, 190 (steroids) - 2h after dinner: 162-214, 262 >> 159-186, 211, 306 >> 142-187, 212 - bedtime: 134-192, 217 >> 133-170 >> n/c - nighttime: n/c >> 180, 212 >> n/c Lowest sugar was 66 >> 84; she has hypoglycemia awareness at 70..  Highest sugar was 306 >> 190.  Glucometer: ReliOn  Pt's meals are: - Breakfast: scrambled eggs, toast or cereals or yoghurt - Lunch: crackers and cheese, carrots, apple - Dinner: meat + salad/veggies + starch - Snacks: 2-3: cashews, cheese and carrots, almost milk + graham crackers  - No h/o  CKD, last BUN/creatinine was higher, though:  Lab Results  Component Value Date   BUN 24 (H) 12/24/2017   CREATININE 1.10 (H) 12/24/2017  01/16/2017: BUN/creatinine 21/0.96, glucose 95 On ramipril. -+ HL; last set of lipids:  01/16/2017: 141/139/45/69 Lab Results  Component Value Date   CHOL 151 01/28/2015   HDL 37 01/28/2015   LDLCALC 71 01/28/2015   TRIG 216 (A) 01/28/2015  On lovastatin. - last eye exam was 02/2018: No DR, no macular edema. Dr. Gershon Crane. - no  numbness and tingling in her feet.   She has a history of colon cancer ~2000. She remembers that the mass perforated her colon at that time.  ROS: Constitutional: no weight gain/no weight loss, no fatigue, no subjective hyperthermia, no subjective hypothermia Eyes: no blurry vision, no xerophthalmia ENT: no sore throat, no nodules palpated in throat, no dysphagia, no odynophagia, no hoarseness Cardiovascular: no CP/no SOB/no palpitations/no leg swelling Respiratory: no cough/no SOB/no wheezing Gastrointestinal: no N/no V/no D/no C/no acid reflux Musculoskeletal: no muscle aches/no joint aches Skin: no rashes, no hair loss Neurological: no tremors/no numbness/no tingling/no dizziness  I reviewed pt's medications, allergies, PMH, social hx, family hx, and changes were documented in the history of present illness. Otherwise, unchanged from my initial visit note.  Past Medical History:  Diagnosis Date  . Arthritis    neck and all over  .  Cancer ([place]) 1998   hx colon ca-chemo radiation; lung  . Chronic back pain   . Diabetes mellitus without complication ([place])   . Hyperlipemia   . Hypertension   . Sarcoidosis    of the eye  . Wears glasses   . Wears partial dentures    top and bottom partials   Past Surgical History:  Procedure Laterality Date  . ABDOMINAL HYSTERECTOMY    . APPENDECTOMY    . CATARACT EXTRACTION, BILATERAL    . COLONOSCOPY     many  . COMBINED MEDIASTINOSCOPY AND BRONCHOSCOPY  2002   biopsy   . DG ARTHRO [facility]*  2011   ablasion rt thumb nailbed  . I&D EXTREMITY Right 07/23/2013   Procedure: IRRIGATION AND DEBRIDEMENT DIP JOINT RIGHT INDEX FINGER, EXCISE MUCOID CYST RIGHT INDEX FINGER  ;  Surgeon: Cammie Sickle., MD;  Location: [place];  Service: Orthopedics;  Laterality: Right;  . LIPOMA EXCISION    . MASS EXCISION Right 09/28/2014   Procedure: EXCISION MASS RIGHT MIDDLE FINGER;  Surgeon: Daryll Brod, MD;  Location: [place];  Service: Orthopedics;  Laterality: Right;  ANESTHESIA:  GENERAL, IV REGIONAL FAB  . MASS EXCISION Bilateral 12/24/2017   Procedure: EXCISION MASS RIGHT SMALL, LEFT INDEX, excision left nail horn middle finger;  Surgeon: Daryll Brod, MD;  Location: [place];  Service: Orthopedics;  Laterality: Bilateral;  FAB  . NAILBED REPAIR Left 08/28/2016   Procedure: Ablation nail matrix left index finger andl left middle finger;  Surgeon: Daryll Brod, MD;  Location: [place];  Service: Orthopedics;  Laterality: Left;  . PARTIAL COLECTOMY  1998   cancer-rad/chemo  . SKIN FULL THICKNESS GRAFT Right 07/23/2013   Procedure: NAIL PLATE ABLATION WITH FULL THICKNESS SKIN GRAFT FROM RIGHT   MEDIAL ELBOW TO RIGHT LONG FINGER NAIL BED;  Surgeon: Cammie Sickle., MD;  Location: [place];  Service: Orthopedics;  Laterality: Right;  . SKIN FULL THICKNESS GRAFT Left 08/28/2016   Procedure: SKIN GRAFT FULL THICKNESS upper left arm;  Surgeon: Daryll Brod, MD;  Location: [place];  Service: Orthopedics;  Laterality: Left;   Social History   Social History  . Marital Status: Married    Spouse Name: N/A  . Number of Children: 3   Occupational History  . retired   Social History Main Topics  . Smoking status: Former Smoker -- 0.50 packs/day    Types: Cigarettes    Quit date: 07/22/1971  . Smokeless tobacco: Not on file  . Alcohol Use: No     Comment: rare  . Drug Use: No   No Known Allergies Family History  Problem Relation Age of Onset  . Cancer Mother        colon   . Hypertension Father   . CVA Father   . Obesity Brother    Current Outpatient Medications  Medication Sig Dispense Refill  . diphenhydramine-acetaminophen (TYLENOL PM) 25-500 MG TABS tablet Take 1 tablet by mouth at bedtime as needed.    Marland Kitchen GLIPIZIDE XL 10 MG 24 hr tablet TAKE HALF TO 1 TABLET TWICE DAILY BEFORE MEALS 180 tablet 3  . glucose blood (ONE TOUCH TEST STRIPS) test strip 1 each by Other route daily. Use as instructed    . hydrochlorothiazide (HYDRODIURIL) 25 MG tablet Take 1 tablet (25 mg total) by mouth daily. 30 tablet 0  . Insulin Pen Needle (RELION PEN NEEDLES) 32G X 4  MM MISC USE ONE  ONCE DAILY 100 each 4  . LEVEMIR FLEXTOUCH 100 UNIT/ML Pen INJECT 15 UNITS SUBCUTANEOUSLY ONCE DAILY AT BEDTIME 15 pen 2  . lovastatin (MEVACOR) 10 MG tablet Take 10 mg by mouth at bedtime.    . metFORMIN (GLUCOPHAGE) 1000 MG tablet TAKE 1 TABLET BY MOUTH TWICE DAILY WITH MEALS 180 tablet 3  . Multiple Vitamin (MULTIVITAMIN) tablet Take 1 tablet by mouth daily.    . ramipril (ALTACE) 10 MG capsule Take 10 mg by mouth daily.    . ranitidine (ZANTAC) 150 MG capsule Take 150 mg by mouth 2 (two) times daily.     No current facility-administered medications for this visit.    PE: BP (!) 162/90   Pulse 82   Ht 5\' 2"  (1.575 m)   Wt 158 lb (71.7 kg)   BMI 28.90 kg/m  Body mass index is 28.9 kg/m. Wt Readings from Last 3 Encounters:  04/15/18 158 lb (71.7 kg)  12/24/17 158 lb (71.7 kg)  10/14/17 161 lb (73 kg)   Constitutional: overweight, in NAD Eyes: PERRLA, EOMI, no exophthalmos ENT: moist mucous membranes, no thyromegaly, no cervical lymphadenopathy Cardiovascular: RRR, No MRG Respiratory: CTA B Gastrointestinal: abdomen soft, NT, ND, BS+ Musculoskeletal: no deformities, strength intact in all 4 Skin: moist, warm, no rashes Neurological: no tremor with outstretched hands, DTR normal  in all 4  ASSESSMENT: 1. DM2, insulin-dependent, uncontrolled, without long complications, but with hyperglycemia  2. HL  3. High BP  PLAN:  1. Patient with long-standing, fairly well-controlled type 2 diabetes, on oral antidiabetic regimen and basal insulin.  At last visit, sugars were better, with an HbA1c of 7.1%, so we continued the current regimen.  She is on glipizide - she is not using the second dose only likes to have it available in case she eats a larger meal. -  at this visit, sugars are excellent in the morning, but she did not check much later in the day except around the time of her previous steroid course.  These are higher, but not significantly so, so we can continue the current regimen at this time.  However, she is telling me that she usually has to have a snack at night to avoid dropping her sugars too low in the morning.  Therefore, we will decrease the Levemir to 12 units at bedtime - We discussed that if she gets steroid injections, we may need to take a second dose of glipizide at all times and may even need mealtime insulin. - I suggested to:  Patient Instructions  Please decrease: - Levemir to 12 units at bedtime  Please continue: - Metformin 1000 mg 2x a day, with meals - Glipizide XL 10 mg before b'fast and 1/2 of tablet (5 mg) before dinner.   Please return in  4 months with your sugar log.   - today, HbA1c is 7.1% (stable) - continue checking sugars at different times of the day - check 1x a day, rotating checks - advised for yearly eye exams >> she is UTD - Return to clinic in 4 mo with sugar log    2. HL - Reviewed latest lipid panel from PCP (12/2017): LDL excellent - Continues lovastatin without side effects.  3. High BP -Blood pressure high today, but she tells me that this is due to anxiety.  It was even higher the other day and she was given an anxiolytic after which he decreased significantly.  She will discuss with Dr. Rex Kras  about this in the  next appointment.  Philemon Kingdom, MD PhD [facility] Endocrinology

## 2018-04-15 NOTE — Addendum Note (Signed)
Addended by: Drucilla Schmidt on: 04/15/2018 11:40 AM   Modules accepted: Orders

## 2018-04-24 DIAGNOSIS — I10 Essential (primary) hypertension: Secondary | ICD-10-CM | POA: Diagnosis not present

## 2018-04-24 DIAGNOSIS — M47816 Spondylosis without myelopathy or radiculopathy, lumbar region: Secondary | ICD-10-CM | POA: Diagnosis not present

## 2018-04-30 NOTE — Progress Notes (Addendum)
LOV/SURGICAL CLEARANCE , DR. KEVIN LITTLE ON CHART  EKG 04-24-18 ON CHART FROM [place] AT [facility]   HGBA1C 04-15-18 Epic

## 2018-04-30 NOTE — Patient Instructions (Addendum)
Beth [name]  04/30/2018   Your procedure is scheduled on: 05-09-18   Report to [facility] Main  Entrance    Report to admitting at 10:00AM    Call this number if you have problems the morning of surgery [phone]     Remember: Do not eat food or drink liquids :After Midnight.     Take these medicines the morning of surgery with A SIP OF WATER: CLARITIN, ZANTAC, TYLENOL IF NEEDED                                You may not have any metal on your body including hair pins and              piercings  Do not wear jewelry, make-up, lotions, powders or perfumes, deodorant             Do not wear nail polish.  Do not shave  48 hours prior to surgery.            Do not bring valuables to the hospital. [place].  Contacts, dentures or bridgework may not be worn into surgery.  Leave suitcase in the car. After surgery it may be brought to your room.                 Please read over the following fact sheets you were given: _____________________________________________________________________    How to Manage Your Diabetes Before and After Surgery  Why is it important to control my blood sugar before and after surgery? . Improving blood sugar levels before and after surgery helps healing and can limit problems. . A way of improving blood sugar control is eating a healthy diet by: o  Eating less sugar and carbohydrates o  Increasing activity/exercise o  Talking with your doctor about reaching your blood sugar goals . High blood sugars (greater than 180 mg/dL) can raise your risk of infections and slow your recovery, so you will need to focus on controlling your diabetes during the weeks before surgery. . Make sure that the doctor who takes care of your diabetes knows about your planned surgery including the date and location.  How do I manage my blood sugar before surgery? . Check your blood sugar at  least 4 times a day, starting 2 days before surgery, to make sure that the level is not too high or low. o Check your blood sugar the morning of your surgery when you wake up and every 2 hours until you get to the Short Stay unit. . If your blood sugar is less than 70 mg/dL, you will need to treat for low blood sugar: o Do not take insulin. o Treat a low blood sugar (less than 70 mg/dL) with  cup of clear juice (cranberry or apple), 4 glucose tablets, OR glucose gel. o Recheck blood sugar in 15 minutes after treatment (to make sure it is greater than 70 mg/dL). If your blood sugar is not greater than 70 mg/dL on recheck, call [phone] for further instructions. . Report your blood sugar to the short stay nurse when you get to Short Stay.  . If you are admitted to the hospital after surgery: o Your blood sugar will be checked by  the staff and you will probably be given insulin after surgery (instead of oral diabetes medicines) to make sure you have good blood sugar levels. o The goal for blood sugar control after surgery is 80-180 mg/dL.   WHAT DO I DO ABOUT MY DIABETES MEDICATION?    . THE DAY BEFORE SURGERY o Only  take half dose of your LEVEMIR INSULIN at bedtime o Only take morning dose of GLIPIZIDE o Take METFORMIN normally       . THE MORNING OF SURGERY o DO NOT TAKE GLIPIZIDE OR METFORMIN    Patient Signature:  Date:   Nurse Signature:  Date:   Reviewed and Endorsed by [facility] Patient Education Committee, August 2015            [facility] - Preparing for Surgery Before surgery, you can play an important role.  Because skin is not sterile, your skin needs to be as free of germs as possible.  You can reduce the number of germs on your skin by washing with CHG (chlorahexidine gluconate) soap before surgery.  CHG is an antiseptic cleaner which kills germs and bonds with the skin to continue killing germs even after washing. Please DO NOT use if you have an allergy to  CHG or antibacterial soaps.  If your skin becomes reddened/irritated stop using the CHG and inform your nurse when you arrive at Short Stay. Do not shave (including legs and underarms) for at least 48 hours prior to the first CHG shower.  You may shave your face/neck. Please follow these instructions carefully:  1.  Shower with CHG Soap the night before surgery and the  morning of Surgery.  2.  If you choose to wash your hair, wash your hair first as usual with your  normal  shampoo.  3.  After you shampoo, rinse your hair and body thoroughly to remove the  shampoo.                           4.  Use CHG as you would any other liquid soap.  You can apply chg directly  to the skin and wash                       Gently with a scrungie or clean washcloth.  5.  Apply the CHG Soap to your body ONLY FROM THE NECK DOWN.   Do not use on face/ open                           Wound or open sores. Avoid contact with eyes, ears mouth and genitals (private parts).                       Wash face,  Genitals (private parts) with your normal soap.             6.  Wash thoroughly, paying special attention to the area where your surgery  will be performed.  7.  Thoroughly rinse your body with warm water from the neck down.  8.  DO NOT shower/wash with your normal soap after using and rinsing off  the CHG Soap.                9.  Pat yourself dry with a clean towel.            10.  Wear clean pajamas.  11.  Place clean sheets on your bed the night of your first shower and do not  sleep with pets. Day of Surgery : Do not apply any lotions/deodorants the morning of surgery.  Please wear clean clothes to the hospital/surgery center.  FAILURE TO FOLLOW THESE INSTRUCTIONS MAY RESULT IN THE CANCELLATION OF YOUR SURGERY PATIENT SIGNATURE_________________________________  NURSE SIGNATURE__________________________________  ________________________________________________________________________   Beth [name]  An incentive spirometer is a tool that can help keep your lungs clear and active. This tool measures how well you are filling your lungs with each breath. Taking long deep breaths may help reverse or decrease the chance of developing breathing (pulmonary) problems (especially infection) following:  A long period of time when you are unable to move or be active. BEFORE THE PROCEDURE   If the spirometer includes an indicator to show your best effort, your nurse or respiratory therapist will set it to a desired goal.  If possible, sit up straight or lean slightly forward. Try not to slouch.  Hold the incentive spirometer in an upright position. INSTRUCTIONS FOR USE  1. Sit on the edge of your bed if possible, or sit up as far as you can in bed or on a chair. 2. Hold the incentive spirometer in an upright position. 3. Breathe out normally. 4. Place the mouthpiece in your mouth and seal your lips tightly around it. 5. Breathe in slowly and as deeply as possible, raising the piston or the ball toward the top of the column. 6. Hold your breath for 3-5 seconds or for as long as possible. Allow the piston or ball to fall to the bottom of the column. 7. Remove the mouthpiece from your mouth and breathe out normally. 8. Rest for a few seconds and repeat Steps 1 through 7 at least 10 times every 1-2 hours when you are awake. Take your time and take a few normal breaths between deep breaths. 9. The spirometer may include an indicator to show your best effort. Use the indicator as a goal to work toward during each repetition. 10. After each set of 10 deep breaths, practice coughing to be sure your lungs are clear. If you have an incision (the cut made at the time of surgery), support your incision when coughing by placing a pillow or rolled up towels firmly against it. Once you are able to get out of bed, walk around indoors and cough well. You may stop using the incentive spirometer when  instructed by your caregiver.  RISKS AND COMPLICATIONS  Take your time so you do not get dizzy or light-headed.  If you are in pain, you may need to take or ask for pain medication before doing incentive spirometry. It is harder to take a deep breath if you are having pain. AFTER USE  Rest and breathe slowly and easily.  It can be helpful to keep track of a log of your progress. Your caregiver can provide you with a simple table to help with this. If you are using the spirometer at home, follow these instructions: [place] IF:   You are having difficultly using the spirometer.  You have trouble using the spirometer as often as instructed.  Your pain medication is not giving enough relief while using the spirometer.  You develop fever of 100.5 F (38.1 C) or higher. SEEK IMMEDIATE MEDICAL CARE IF:   You cough up bloody sputum that had not been present before.  You develop fever of 102 F (38.9 C)  or greater.  You develop worsening pain at or near the incision site. MAKE SURE YOU:   Understand these instructions.  Will watch your condition.  Will get help right away if you are not doing well or get worse. Document Released: 01/21/2007 Document Revised: 12/03/2011 Document Reviewed: 03/24/2007 Exit[employer] Patient Information 2014 Exit[employer], [place].   ________________________________________________________________________  WHAT IS A BLOOD TRANSFUSION? Blood Transfusion Information  A transfusion is the replacement of blood or some of its parts. Blood is made up of multiple cells which provide different functions.  Red blood cells carry oxygen and are used for blood loss replacement.  White blood cells fight against infection.  Platelets control bleeding.  Plasma helps clot blood.  Other blood products are available for specialized needs, such as hemophilia or other clotting disorders. BEFORE THE TRANSFUSION  Who gives blood for transfusions?   Healthy  volunteers who are fully evaluated to make sure their blood is safe. This is blood bank blood. Transfusion therapy is the safest it has ever been in the practice of medicine. Before blood is taken from a donor, a complete history is taken to make sure that person has no history of diseases nor engages in risky social behavior (examples are intravenous drug use or sexual activity with multiple partners). The donor's travel history is screened to minimize risk of transmitting infections, such as malaria. The donated blood is tested for signs of infectious diseases, such as HIV and hepatitis. The blood is then tested to be sure it is compatible with you in order to minimize the chance of a transfusion reaction. If you or a relative donates blood, this is often done in anticipation of surgery and is not appropriate for emergency situations. It takes many days to process the donated blood. RISKS AND COMPLICATIONS Although transfusion therapy is very safe and saves many lives, the main dangers of transfusion include:   Getting an infectious disease.  Developing a transfusion reaction. This is an allergic reaction to something in the blood you were given. Every precaution is taken to prevent this. The decision to have a blood transfusion has been considered carefully by your caregiver before blood is given. Blood is not given unless the benefits outweigh the risks. AFTER THE TRANSFUSION  Right after receiving a blood transfusion, you will usually feel much better and more energetic. This is especially true if your red blood cells have gotten low (anemic). The transfusion raises the level of the red blood cells which carry oxygen, and this usually causes an energy increase.  The nurse administering the transfusion will monitor you carefully for complications. HOME CARE INSTRUCTIONS  No special instructions are needed after a transfusion. You may find your energy is better. Speak with your caregiver about any  limitations on activity for underlying diseases you may have. SEEK MEDICAL CARE IF:   Your condition is not improving after your transfusion.  You develop redness or irritation at the intravenous (IV) site. SEEK IMMEDIATE MEDICAL CARE IF:  Any of the following symptoms occur over the next 12 hours:  Shaking chills.  You have a temperature by mouth above 102 F (38.9 C), not controlled by medicine.  Chest, back, or muscle pain.  People around you feel you are not acting correctly or are confused.  Shortness of breath or difficulty breathing.  Dizziness and fainting.  You get a rash or develop hives.  You have a decrease in urine output.  Your urine turns a dark color or changes to pink, red,  or brown. Any of the following symptoms occur over the next 10 days:  You have a temperature by mouth above 102 F (38.9 C), not controlled by medicine.  Shortness of breath.  Weakness after normal activity.  The white part of the eye turns yellow (jaundice).  You have a decrease in the amount of urine or are urinating less often.  Your urine turns a dark color or changes to pink, red, or brown. Document Released: 09/07/2000 Document Revised: 12/03/2011 Document Reviewed: 04/26/2008 [facility] Patient Information 2014 [place], [place].  _______________________________________________________________________

## 2018-05-02 ENCOUNTER — Ambulatory Visit (HOSPITAL_COMMUNITY)
Admission: RE | Admit: 2018-05-02 | Discharge: 2018-05-02 | Disposition: A | Discharge status: Home / Self Care | Payer: Medicare Other | Source: Ambulatory Visit | Attending: Orthopedic Surgery | Admitting: Orthopedic Surgery

## 2018-05-02 ENCOUNTER — Encounter (HOSPITAL_COMMUNITY): Payer: Self-pay

## 2018-05-02 ENCOUNTER — Other Ambulatory Visit: Payer: Self-pay

## 2018-05-02 ENCOUNTER — Encounter (HOSPITAL_COMMUNITY)
Admission: RE | Admit: 2018-05-02 | Discharge: 2018-05-02 | Disposition: A | Discharge status: Home / Self Care | Payer: Medicare Other | Source: Ambulatory Visit | Attending: Orthopedic Surgery | Admitting: Orthopedic Surgery

## 2018-05-02 DIAGNOSIS — M545 Low back pain, unspecified: Secondary | ICD-10-CM

## 2018-05-02 DIAGNOSIS — M48061 Spinal stenosis, lumbar region without neurogenic claudication: Secondary | ICD-10-CM | POA: Diagnosis not present

## 2018-05-02 DIAGNOSIS — M5136 Other intervertebral disc degeneration, lumbar region: Secondary | ICD-10-CM | POA: Diagnosis not present

## 2018-05-02 DIAGNOSIS — Z01812 Encounter for preprocedural laboratory examination: Secondary | ICD-10-CM | POA: Insufficient documentation

## 2018-05-02 DIAGNOSIS — I700 Atherosclerosis of aorta: Secondary | ICD-10-CM | POA: Insufficient documentation

## 2018-05-02 DIAGNOSIS — M5126 Other intervertebral disc displacement, lumbar region: Secondary | ICD-10-CM | POA: Diagnosis not present

## 2018-05-02 HISTORY — DX: Personal history of other diseases of the circulatory system: Z86.79

## 2018-05-02 HISTORY — DX: Psoriasis, unspecified: L40.9

## 2018-05-02 LAB — COMPREHENSIVE METABOLIC PANEL
ALT: 19 U/L (ref 0–44)
AST: 21 U/L (ref 15–41)
Albumin: 4.4 g/dL (ref 3.5–5.0)
Alkaline Phosphatase: 101 U/L (ref 38–126)
Anion gap: 11 (ref 5–15)
BUN: 39 mg/dL — ABNORMAL HIGH (ref 8–23)
CO2: 26 mmol/L (ref 22–32)
Calcium: 9.4 mg/dL (ref 8.9–10.3)
Chloride: 98 mmol/L (ref 98–111)
Creatinine, Ser: 1.26 mg/dL — ABNORMAL HIGH (ref 0.44–1.00)
GFR calc Af Amer: 47 mL/min — ABNORMAL LOW (ref >60)
GFR calc non Af Amer: 40 mL/min — ABNORMAL LOW (ref >60)
Glucose, Bld: 258 mg/dL — ABNORMAL HIGH (ref 70–99)
Potassium: 4 mmol/L (ref 3.5–5.1)
Sodium: 135 mmol/L (ref 135–145)
Total Bilirubin: 0.4 mg/dL (ref 0.3–1.2)
Total Protein: 7.5 g/dL (ref 6.5–8.1)

## 2018-05-02 LAB — CBC WITH DIFFERENTIAL/PLATELET
Basophils Absolute: 0 10*3/uL (ref 0.0–0.1)
Basophils Relative: 0 %
Eosinophils Absolute: 0.1 10*3/uL (ref 0.0–0.7)
Eosinophils Relative: 1 %
HCT: 37.6 % (ref 36.0–46.0)
Hemoglobin: 12.6 g/dL (ref 12.0–15.0)
Lymphocytes Relative: 19 %
Lymphs Abs: 1.9 10*3/uL (ref 0.7–4.0)
MCH: 31.2 pg (ref 26.0–34.0)
MCHC: 33.5 g/dL (ref 30.0–36.0)
MCV: 93.1 fL (ref 78.0–100.0)
Monocytes Absolute: 0.5 10*3/uL (ref 0.1–1.0)
Monocytes Relative: 5 %
Neutro Abs: 7.3 10*3/uL (ref 1.7–7.7)
Neutrophils Relative %: 75 %
Platelets: 307 10*3/uL (ref 150–400)
RBC: 4.04 MIL/uL (ref 3.87–5.11)
RDW: 13.3 % (ref 11.5–15.5)
WBC: 9.8 10*3/uL (ref 4.0–10.5)

## 2018-05-02 LAB — SURGICAL PCR SCREEN
MRSA, PCR: NEGATIVE
STAPHYLOCOCCUS AUREUS: NEGATIVE

## 2018-05-02 LAB — PROTIME-INR
INR: 0.97
Prothrombin Time: 12.8 seconds (ref 11.4–15.2)

## 2018-05-02 LAB — APTT: aPTT: 28 seconds (ref 24–36)

## 2018-05-02 LAB — ABO/RH: ABO/RH(D): O POS

## 2018-05-02 LAB — GLUCOSE, CAPILLARY: Glucose-Capillary: 176 mg/dL — ABNORMAL HIGH (ref 70–99)

## 2018-05-02 NOTE — Progress Notes (Signed)
CMP routed via epic to Dr Gladstone Lighter

## 2018-05-08 MED ORDER — BUPIVACAINE LIPOSOME 1.3 % IJ SUSP
20.0000 mL | INTRAMUSCULAR | Status: AC
  Filled 2018-05-08: qty 20

## 2018-05-09 ENCOUNTER — Inpatient Hospital Stay (HOSPITAL_COMMUNITY)
Admission: RE | Admit: 2018-05-09 | Discharge: 2018-05-10 | DRG: 517 | Disposition: A | Discharge status: Home / Self Care | Payer: Medicare Other | Source: Ambulatory Visit | Attending: Orthopedic Surgery | Admitting: Orthopedic Surgery

## 2018-05-09 ENCOUNTER — Other Ambulatory Visit: Payer: Self-pay

## 2018-05-09 ENCOUNTER — Encounter (HOSPITAL_COMMUNITY)
Admission: RE | Disposition: A | Discharge status: Home / Self Care | Payer: Self-pay | Source: Ambulatory Visit | Attending: Orthopedic Surgery

## 2018-05-09 ENCOUNTER — Ambulatory Visit (HOSPITAL_COMMUNITY): Payer: Medicare Other | Admitting: Registered Nurse

## 2018-05-09 ENCOUNTER — Ambulatory Visit (HOSPITAL_COMMUNITY): Payer: Medicare Other

## 2018-05-09 ENCOUNTER — Encounter (HOSPITAL_COMMUNITY): Payer: Self-pay | Admitting: Registered Nurse

## 2018-05-09 DIAGNOSIS — Z79899 Other long term (current) drug therapy: Secondary | ICD-10-CM | POA: Diagnosis not present

## 2018-05-09 DIAGNOSIS — Z9842 Cataract extraction status, left eye: Secondary | ICD-10-CM | POA: Diagnosis not present

## 2018-05-09 DIAGNOSIS — E785 Hyperlipidemia, unspecified: Secondary | ICD-10-CM | POA: Diagnosis not present

## 2018-05-09 DIAGNOSIS — E119 Type 2 diabetes mellitus without complications: Secondary | ICD-10-CM | POA: Diagnosis not present

## 2018-05-09 DIAGNOSIS — Z90710 Acquired absence of both cervix and uterus: Secondary | ICD-10-CM

## 2018-05-09 DIAGNOSIS — L409 Psoriasis, unspecified: Secondary | ICD-10-CM | POA: Diagnosis not present

## 2018-05-09 DIAGNOSIS — M21371 Foot drop, right foot: Secondary | ICD-10-CM | POA: Diagnosis not present

## 2018-05-09 DIAGNOSIS — Z9049 Acquired absence of other specified parts of digestive tract: Secondary | ICD-10-CM

## 2018-05-09 DIAGNOSIS — I10 Essential (primary) hypertension: Secondary | ICD-10-CM | POA: Diagnosis present

## 2018-05-09 DIAGNOSIS — Z9841 Cataract extraction status, right eye: Secondary | ICD-10-CM | POA: Diagnosis not present

## 2018-05-09 DIAGNOSIS — Z794 Long term (current) use of insulin: Secondary | ICD-10-CM | POA: Diagnosis not present

## 2018-05-09 DIAGNOSIS — Z9221 Personal history of antineoplastic chemotherapy: Secondary | ICD-10-CM

## 2018-05-09 DIAGNOSIS — Z85038 Personal history of other malignant neoplasm of large intestine: Secondary | ICD-10-CM

## 2018-05-09 DIAGNOSIS — M48062 Spinal stenosis, lumbar region with neurogenic claudication: Secondary | ICD-10-CM | POA: Diagnosis not present

## 2018-05-09 DIAGNOSIS — Z419 Encounter for procedure for purposes other than remedying health state, unspecified: Secondary | ICD-10-CM

## 2018-05-09 DIAGNOSIS — Z8249 Family history of ischemic heart disease and other diseases of the circulatory system: Secondary | ICD-10-CM

## 2018-05-09 DIAGNOSIS — M48061 Spinal stenosis, lumbar region without neurogenic claudication: Secondary | ICD-10-CM | POA: Diagnosis not present

## 2018-05-09 DIAGNOSIS — Z923 Personal history of irradiation: Secondary | ICD-10-CM

## 2018-05-09 DIAGNOSIS — Z809 Family history of malignant neoplasm, unspecified: Secondary | ICD-10-CM | POA: Diagnosis not present

## 2018-05-09 DIAGNOSIS — Z973 Presence of spectacles and contact lenses: Secondary | ICD-10-CM | POA: Diagnosis not present

## 2018-05-09 DIAGNOSIS — Z972 Presence of dental prosthetic device (complete) (partial): Secondary | ICD-10-CM

## 2018-05-09 DIAGNOSIS — Z981 Arthrodesis status: Secondary | ICD-10-CM | POA: Diagnosis not present

## 2018-05-09 DIAGNOSIS — M9983 Other biomechanical lesions of lumbar region: Secondary | ICD-10-CM | POA: Diagnosis not present

## 2018-05-09 DIAGNOSIS — Z87891 Personal history of nicotine dependence: Secondary | ICD-10-CM | POA: Diagnosis not present

## 2018-05-09 HISTORY — PX: FORAMINOTOMY 1 LEVEL: SHX5835

## 2018-05-09 HISTORY — PX: LUMBAR LAMINECTOMY/DECOMPRESSION MICRODISCECTOMY: SHX5026

## 2018-05-09 LAB — TYPE AND SCREEN
ABO/RH(D): O POS
Antibody Screen: NEGATIVE

## 2018-05-09 LAB — GLUCOSE, CAPILLARY
GLUCOSE-CAPILLARY: 66 mg/dL — AB (ref 70–99)
GLUCOSE-CAPILLARY: 54 mg/dL — AB (ref 70–99)
GLUCOSE-CAPILLARY: 166 mg/dL — AB (ref 70–99)
GLUCOSE-CAPILLARY: 170 mg/dL — AB (ref 70–99)
Glucose-Capillary: 189 mg/dL — ABNORMAL HIGH (ref 70–99)
Glucose-Capillary: 147 mg/dL — ABNORMAL HIGH (ref 70–99)
Glucose-Capillary: 323 mg/dL — ABNORMAL HIGH (ref 70–99)
Glucose-Capillary: 290 mg/dL — ABNORMAL HIGH (ref 70–99)

## 2018-05-09 SURGERY — LUMBAR LAMINECTOMY/DECOMPRESSION MICRODISCECTOMY
Anesthesia: General | Site: Back

## 2018-05-09 MED ORDER — SODIUM CHLORIDE 0.9 % IV SOLN
INTRAVENOUS | Status: AC
  Filled 2018-05-09: qty 500000

## 2018-05-09 MED ORDER — SUGAMMADEX SODIUM 200 MG/2ML IV SOLN
INTRAVENOUS | Status: AC
  Filled 2018-05-09: qty 2

## 2018-05-09 MED ORDER — POLYMYXIN B SULFATE 500000 UNITS IJ SOLR
INTRAMUSCULAR | Status: DC | PRN
  Administered 2018-05-09: 500 mL

## 2018-05-09 MED ORDER — SODIUM CHLORIDE 0.9 % IV SOLN
INTRAVENOUS | Status: DC | PRN
  Administered 2018-05-09: 25 ug/min via INTRAVENOUS

## 2018-05-09 MED ORDER — ACETAMINOPHEN 10 MG/ML IV SOLN
INTRAVENOUS | Status: DC | PRN
  Administered 2018-05-09: 1000 mg via INTRAVENOUS

## 2018-05-09 MED ORDER — BISACODYL 5 MG PO TBEC
5.0000 mg | DELAYED_RELEASE_TABLET | Freq: Every day | ORAL | Status: DC | PRN
  Administered 2018-05-09: 5 mg via ORAL
  Filled 2018-05-09: qty 1

## 2018-05-09 MED ORDER — INSULIN ASPART 100 UNIT/ML ~~LOC~~ SOLN
0.0000 [IU] | Freq: Three times a day (TID) | SUBCUTANEOUS | Status: DC
  Administered 2018-05-09: 11 [IU] via SUBCUTANEOUS
  Administered 2018-05-10: 2 [IU] via SUBCUTANEOUS

## 2018-05-09 MED ORDER — LACTATED RINGERS IV SOLN
INTRAVENOUS | Status: DC
  Administered 2018-05-09: 14:00:00 via INTRAVENOUS
  Administered 2018-05-09 (×2): 1000 mL via INTRAVENOUS

## 2018-05-09 MED ORDER — CEFAZOLIN SODIUM-DEXTROSE 1-4 GM/50ML-% IV SOLN
1.0000 g | Freq: Three times a day (TID) | INTRAVENOUS | Status: DC
  Administered 2018-05-09 – 2018-05-10 (×2): 1 g via INTRAVENOUS
  Filled 2018-05-09 (×2): qty 50

## 2018-05-09 MED ORDER — SUCCINYLCHOLINE CHLORIDE 200 MG/10ML IV SOSY
PREFILLED_SYRINGE | INTRAVENOUS | Status: AC
  Filled 2018-05-09: qty 10

## 2018-05-09 MED ORDER — ACETAMINOPHEN 650 MG RE SUPP: 650.0000 mg | RECTAL | Status: DC | PRN

## 2018-05-09 MED ORDER — DEXTROSE 50 % IV SOLN
INTRAVENOUS | Status: AC
  Administered 2018-05-09: 50 mL
  Filled 2018-05-09: qty 50

## 2018-05-09 MED ORDER — PHENYLEPHRINE HCL 10 MG/ML IJ SOLN
INTRAMUSCULAR | Status: DC | PRN
  Administered 2018-05-09: 120 ug via INTRAVENOUS

## 2018-05-09 MED ORDER — PHENOL 1.4 % MT LIQD: 1.0000 | OROMUCOSAL | Status: DC | PRN

## 2018-05-09 MED ORDER — BUPIVACAINE-EPINEPHRINE (PF) 0.5% -1:200000 IJ SOLN
INTRAMUSCULAR | Status: AC
  Filled 2018-05-09: qty 30

## 2018-05-09 MED ORDER — SUGAMMADEX SODIUM 200 MG/2ML IV SOLN
INTRAVENOUS | Status: DC | PRN
  Administered 2018-05-09: 150 mg via INTRAVENOUS

## 2018-05-09 MED ORDER — DEXTROSE 50 % IV SOLN: 1.0000 | Freq: Once | INTRAVENOUS | Status: DC

## 2018-05-09 MED ORDER — DEXAMETHASONE SODIUM PHOSPHATE 10 MG/ML IJ SOLN
INTRAMUSCULAR | Status: DC | PRN
  Administered 2018-05-09: 10 mg via INTRAVENOUS

## 2018-05-09 MED ORDER — CHLORHEXIDINE GLUCONATE 4 % EX LIQD: 60.0000 mL | Freq: Once | CUTANEOUS | Status: DC

## 2018-05-09 MED ORDER — BACITRACIN-NEOMYCIN-POLYMYXIN 400-5-5000 EX OINT
TOPICAL_OINTMENT | CUTANEOUS | Status: AC
  Filled 2018-05-09: qty 1

## 2018-05-09 MED ORDER — HYDROCODONE-ACETAMINOPHEN 5-325 MG PO TABS: 1.0000 | ORAL_TABLET | ORAL | Status: DC | PRN

## 2018-05-09 MED ORDER — HEMOSTATIC AGENTS (NO CHARGE) OPTIME
TOPICAL | Status: DC | PRN
  Administered 2018-05-09: 1 via TOPICAL

## 2018-05-09 MED ORDER — SUGAMMADEX SODIUM 200 MG/2ML IV SOLN
INTRAVENOUS | Status: AC
Start: 2018-05-09 — End: ?
  Filled 2018-05-09: qty 2

## 2018-05-09 MED ORDER — ONDANSETRON HCL 4 MG/2ML IJ SOLN
INTRAMUSCULAR | Status: AC
  Filled 2018-05-09: qty 2

## 2018-05-09 MED ORDER — BUPIVACAINE-EPINEPHRINE (PF) 0.5% -1:200000 IJ SOLN
INTRAMUSCULAR | Status: DC | PRN
  Administered 2018-05-09: 20 mL

## 2018-05-09 MED ORDER — HYDROCODONE-ACETAMINOPHEN 10-325 MG PO TABS
2.0000 | ORAL_TABLET | ORAL | Status: DC | PRN
  Filled 2018-05-09: qty 2

## 2018-05-09 MED ORDER — FENTANYL CITRATE (PF) 100 MCG/2ML IJ SOLN: 25.0000 ug | INTRAMUSCULAR | Status: DC | PRN

## 2018-05-09 MED ORDER — RAMIPRIL 10 MG PO CAPS
10.0000 mg | ORAL_CAPSULE | Freq: Every day | ORAL | Status: DC
  Administered 2018-05-09: 10 mg via ORAL
  Filled 2018-05-09 (×2): qty 1

## 2018-05-09 MED ORDER — HYDROMORPHONE HCL 1 MG/ML IJ SOLN: 0.5000 mg | INTRAMUSCULAR | Status: DC | PRN

## 2018-05-09 MED ORDER — MIDAZOLAM HCL 2 MG/2ML IJ SOLN
INTRAMUSCULAR | Status: AC
  Filled 2018-05-09: qty 2

## 2018-05-09 MED ORDER — ONDANSETRON HCL 4 MG/2ML IJ SOLN
INTRAMUSCULAR | Status: DC | PRN
  Administered 2018-05-09: 4 mg via INTRAVENOUS

## 2018-05-09 MED ORDER — FLEET ENEMA 7-19 GM/118ML RE ENEM: 1.0000 | ENEMA | Freq: Once | RECTAL | Status: DC | PRN

## 2018-05-09 MED ORDER — LIDOCAINE 2% (20 MG/ML) 5 ML SYRINGE
INTRAMUSCULAR | Status: AC
  Filled 2018-05-09: qty 5

## 2018-05-09 MED ORDER — METHOCARBAMOL 500 MG IVPB - SIMPLE MED
500.0000 mg | Freq: Four times a day (QID) | INTRAVENOUS | Status: DC | PRN
  Filled 2018-05-09: qty 50

## 2018-05-09 MED ORDER — ONDANSETRON HCL 4 MG PO TABS: 4.0000 mg | ORAL_TABLET | Freq: Four times a day (QID) | ORAL | Status: DC | PRN

## 2018-05-09 MED ORDER — TETRAHYDROZOLINE HCL 0.05 % OP SOLN: 1.0000 [drp] | Freq: Every day | OPHTHALMIC | Status: DC | PRN

## 2018-05-09 MED ORDER — ROCURONIUM BROMIDE 10 MG/ML (PF) SYRINGE
PREFILLED_SYRINGE | INTRAVENOUS | Status: AC
  Filled 2018-05-09: qty 10

## 2018-05-09 MED ORDER — POLYETHYLENE GLYCOL 3350 17 G PO PACK
17.0000 g | PACK | Freq: Every day | ORAL | Status: DC | PRN
  Administered 2018-05-09: 17 g via ORAL
  Filled 2018-05-09: qty 1

## 2018-05-09 MED ORDER — ACETAMINOPHEN 10 MG/ML IV SOLN
INTRAVENOUS | Status: AC
  Filled 2018-05-09: qty 100

## 2018-05-09 MED ORDER — SUCCINYLCHOLINE CHLORIDE 200 MG/10ML IV SOSY
PREFILLED_SYRINGE | INTRAVENOUS | Status: DC | PRN
  Administered 2018-05-09: 120 mg via INTRAVENOUS

## 2018-05-09 MED ORDER — LIDOCAINE 2% (20 MG/ML) 5 ML SYRINGE
INTRAMUSCULAR | Status: DC | PRN
  Administered 2018-05-09: 25 mg via INTRAVENOUS
  Administered 2018-05-09: 75 mg via INTRAVENOUS

## 2018-05-09 MED ORDER — LACTATED RINGERS IV SOLN
INTRAVENOUS | Status: DC
  Administered 2018-05-09 – 2018-05-10 (×2): via INTRAVENOUS

## 2018-05-09 MED ORDER — CEFAZOLIN SODIUM-DEXTROSE 2-4 GM/100ML-% IV SOLN
INTRAVENOUS | Status: AC
  Filled 2018-05-09: qty 100

## 2018-05-09 MED ORDER — ROCURONIUM BROMIDE 10 MG/ML (PF) SYRINGE
PREFILLED_SYRINGE | INTRAVENOUS | Status: DC | PRN
  Administered 2018-05-09: 40 mg via INTRAVENOUS

## 2018-05-09 MED ORDER — DEXAMETHASONE SODIUM PHOSPHATE 10 MG/ML IJ SOLN
INTRAMUSCULAR | Status: AC
  Filled 2018-05-09: qty 1

## 2018-05-09 MED ORDER — CEFAZOLIN SODIUM-DEXTROSE 2-4 GM/100ML-% IV SOLN
2.0000 g | INTRAVENOUS | Status: AC
  Administered 2018-05-09: 2 g via INTRAVENOUS

## 2018-05-09 MED ORDER — PROPOFOL 10 MG/ML IV BOLUS
INTRAVENOUS | Status: AC
  Filled 2018-05-09: qty 20

## 2018-05-09 MED ORDER — FAMOTIDINE 20 MG PO TABS
10.0000 mg | ORAL_TABLET | Freq: Every day | ORAL | Status: DC
  Administered 2018-05-09: 10 mg via ORAL
  Filled 2018-05-09: qty 1

## 2018-05-09 MED ORDER — BUPIVACAINE LIPOSOME 1.3 % IJ SUSP
INTRAMUSCULAR | Status: DC | PRN
  Administered 2018-05-09: 20 mL

## 2018-05-09 MED ORDER — MENTHOL 3 MG MT LOZG: 1.0000 | LOZENGE | OROMUCOSAL | Status: DC | PRN

## 2018-05-09 MED ORDER — HYDROCHLOROTHIAZIDE 25 MG PO TABS: 25.0000 mg | ORAL_TABLET | Freq: Every day | ORAL | Status: DC

## 2018-05-09 MED ORDER — BACITRACIN-NEOMYCIN-POLYMYXIN 400-5-5000 EX OINT
TOPICAL_OINTMENT | CUTANEOUS | Status: DC | PRN
  Administered 2018-05-09: 1 via TOPICAL

## 2018-05-09 MED ORDER — ONDANSETRON HCL 4 MG/2ML IJ SOLN: 4.0000 mg | Freq: Four times a day (QID) | INTRAMUSCULAR | Status: DC | PRN

## 2018-05-09 MED ORDER — PRAVASTATIN SODIUM 20 MG PO TABS
10.0000 mg | ORAL_TABLET | Freq: Every day | ORAL | Status: DC
  Administered 2018-05-09: 10 mg via ORAL
  Filled 2018-05-09: qty 1

## 2018-05-09 MED ORDER — FENTANYL CITRATE (PF) 250 MCG/5ML IJ SOLN
INTRAMUSCULAR | Status: AC
  Filled 2018-05-09: qty 5

## 2018-05-09 MED ORDER — PROPOFOL 10 MG/ML IV BOLUS
INTRAVENOUS | Status: DC | PRN
  Administered 2018-05-09: 100 mg via INTRAVENOUS

## 2018-05-09 MED ORDER — FENTANYL CITRATE (PF) 100 MCG/2ML IJ SOLN
INTRAMUSCULAR | Status: DC | PRN
  Administered 2018-05-09 (×2): 50 ug via INTRAVENOUS

## 2018-05-09 MED ORDER — ACETAMINOPHEN 325 MG PO TABS
650.0000 mg | ORAL_TABLET | ORAL | Status: DC | PRN
Start: 2018-05-09 — End: 2018-05-10
  Administered 2018-05-10 (×2): 650 mg via ORAL
  Filled 2018-05-09 (×2): qty 2

## 2018-05-09 MED ORDER — METHOCARBAMOL 500 MG PO TABS: 500.0000 mg | ORAL_TABLET | Freq: Four times a day (QID) | ORAL | Status: DC | PRN

## 2018-05-09 SURGICAL SUPPLY — 54 items
AGENT HMST SPONGE THK3/8 (HEMOSTASIS) ×2
APL SKNCLS STERI-STRIP NONHPOA (GAUZE/BANDAGES/DRESSINGS) ×2
BAG SPEC THK2 15X12 ZIP CLS (MISCELLANEOUS) ×2
BAG ZIPLOCK 12X15 (MISCELLANEOUS) ×1 IMPLANT
BENZOIN TINCTURE PRP APPL 2/3 (GAUZE/BANDAGES/DRESSINGS) ×3 IMPLANT
CLEANER TIP ELECTROSURG 2X2 (MISCELLANEOUS) ×3 IMPLANT
COVER MAYO STAND STRL (DRAPES) ×1 IMPLANT
COVER SURGICAL LIGHT HANDLE (MISCELLANEOUS) ×3 IMPLANT
DRAIN PENROSE 18X1/4 LTX STRL (WOUND CARE) IMPLANT
DRAPE MICROSCOPE LEICA (MISCELLANEOUS) ×3 IMPLANT
DRAPE POUCH INSTRU U-SHP 10X18 (DRAPES) ×3 IMPLANT
DRAPE SHEET LG 3/4 BI-LAMINATE (DRAPES) ×3 IMPLANT
DRAPE SURG 17X11 SM STRL (DRAPES) ×3 IMPLANT
DRSG ADAPTIC 3X8 NADH LF (GAUZE/BANDAGES/DRESSINGS) ×3 IMPLANT
DRSG PAD ABDOMINAL 8X10 ST (GAUZE/BANDAGES/DRESSINGS) ×10 IMPLANT
DURAPREP 26ML APPLICATOR (WOUND CARE) ×3 IMPLANT
ELECT BLADE TIP CTD 4 INCH (ELECTRODE) ×3 IMPLANT
ELECT REM PT RETURN 15FT ADLT (MISCELLANEOUS) ×3 IMPLANT
GAUZE SPONGE 4X4 12PLY STRL (GAUZE/BANDAGES/DRESSINGS) ×3 IMPLANT
GLOVE BIOGEL PI IND STRL 6.5 (GLOVE) ×2 IMPLANT
GLOVE BIOGEL PI IND STRL 7.5 (GLOVE) IMPLANT
GLOVE BIOGEL PI IND STRL 8.5 (GLOVE) ×2 IMPLANT
GLOVE BIOGEL PI INDICATOR 6.5 (GLOVE) ×1
GLOVE BIOGEL PI INDICATOR 7.5 (GLOVE) ×3
GLOVE BIOGEL PI INDICATOR 8.5 (GLOVE) ×1
GLOVE ECLIPSE 8.0 STRL XLNG CF (GLOVE) ×6 IMPLANT
GLOVE SURG SS PI 6.5 STRL IVOR (GLOVE) ×3 IMPLANT
GLOVE SURG SS PI 7.5 STRL IVOR (GLOVE) ×1 IMPLANT
GOWN SPEC L3 XXLG W/TWL (GOWN DISPOSABLE) ×1 IMPLANT
GOWN STRL REUS W/ TWL LRG LVL3 (GOWN DISPOSABLE) ×2 IMPLANT
GOWN STRL REUS W/TWL LRG LVL3 (GOWN DISPOSABLE) ×3
GOWN STRL REUS W/TWL XL LVL3 (GOWN DISPOSABLE) ×5 IMPLANT
HEMOSTAT SPONGE AVITENE ULTRA (HEMOSTASIS) ×3 IMPLANT
KIT BASIN OR (CUSTOM PROCEDURE TRAY) ×3 IMPLANT
KIT POSITIONING SURG ANDREWS (MISCELLANEOUS) ×3 IMPLANT
MANIFOLD NEPTUNE II (INSTRUMENTS) ×3 IMPLANT
MARKER SKIN DUAL TIP RULER LAB (MISCELLANEOUS) ×3 IMPLANT
NDL SPNL 18GX3.5 QUINCKE PK (NEEDLE) ×4 IMPLANT
NEEDLE HYPO 22GX1.5 SAFETY (NEEDLE) ×6 IMPLANT
NEEDLE SPNL 18GX3.5 QUINCKE PK (NEEDLE) ×6 IMPLANT
PACK LAMINECTOMY ORTHO (CUSTOM PROCEDURE TRAY) ×3 IMPLANT
PATTIES SURGICAL .5 X.5 (GAUZE/BANDAGES/DRESSINGS) ×1 IMPLANT
PATTIES SURGICAL .75X.75 (GAUZE/BANDAGES/DRESSINGS) ×3 IMPLANT
PIN SAFETY NICK PLATE  2 MED (MISCELLANEOUS)
PIN SAFETY NICK PLATE 2 MED (MISCELLANEOUS) IMPLANT
SPONGE LAP 4X18 RFD (DISPOSABLE) ×6 IMPLANT
STAPLER VISISTAT 35W (STAPLE) ×3 IMPLANT
SUT VIC AB 1 CT1 27 (SUTURE) ×6
SUT VIC AB 1 CT1 27XBRD ANTBC (SUTURE) ×4 IMPLANT
SUT VIC AB 2-0 CT1 27 (SUTURE) ×6
SUT VIC AB 2-0 CT1 TAPERPNT 27 (SUTURE) ×4 IMPLANT
SYR 20CC LL (SYRINGE) ×5 IMPLANT
TAPE CLOTH SURG 6X10 WHT LF (GAUZE/BANDAGES/DRESSINGS) ×1 IMPLANT
TOWEL OR 17X26 10 PK STRL BLUE (TOWEL DISPOSABLE) ×3 IMPLANT

## 2018-05-09 NOTE — Anesthesia Preprocedure Evaluation (Addendum)
Anesthesia Evaluation  Patient identified by MRN, date of birth, ID band Patient awake    Reviewed: Allergy & Precautions, NPO status , Patient's Chart, lab work & pertinent test results  Airway Mallampati: II  TM Distance: >3 FB     Dental   Pulmonary former smoker,    breath sounds clear to auscultation       Cardiovascular hypertension,  Rhythm:Regular Rate:Normal     Neuro/Psych    GI/Hepatic negative GI ROS, Neg liver ROS,   Endo/Other  diabetes  Renal/GU negative Renal ROS     Musculoskeletal   Abdominal   Peds  Hematology   Anesthesia Other Findings   Reproductive/Obstetrics                             Anesthesia Physical Anesthesia Plan  ASA: III  Anesthesia Plan: General   Post-op Pain Management:    Induction: Intravenous  PONV Risk Score and Plan: Ondansetron, Dexamethasone, Midazolam and Treatment may vary due to age or medical condition  Airway Management Planned: Oral ETT  Additional Equipment:   Intra-op Plan:   Post-operative Plan: Extubation in OR  Informed Consent: I have reviewed the patients History and Physical, chart, labs and discussed the procedure including the risks, benefits and alternatives for the proposed anesthesia with the patient or authorized representative who has indicated his/her understanding and acceptance.   Dental advisory given  Plan Discussed with: CRNA and Anesthesiologist  Anesthesia Plan Comments:         Anesthesia Quick Evaluation

## 2018-05-09 NOTE — H&P (Signed)
Beth [name] is an 77 y.o. female.   Chief Complaint: low back pain HPI: The patient is a 77 year old female who presented with the chief complaint of low back pain. She reports having this for a few months with no known injury. She reports that she has developed pain into the right lower leg as well. She has had some numbness and tinging in the leg at times as well. She did not improve with conservative treatments including NSAIDs, activity modification, and oral corticosteroids. CT myelogram showed severe spinal stenosis at L4-L5 on the right.   Past Medical History:  Diagnosis Date  . Arthritis    neck and all over  . Cancer ([place]) 1998   hx colon ca-chemo radiation; lung; denies lung involvement   . Chronic back pain   . Diabetes mellitus without complication ([place])   . History of Raynaud's syndrome   . Hyperlipemia   . Hypertension   . Psoriasis    afflicts bed of fingernails   . 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 Wears glasses   . Wears partial dentures    top and bottom partials    Past Surgical History:  Procedure Laterality Date  . ABDOMINAL HYSTERECTOMY    . APPENDECTOMY    . CATARACT EXTRACTION, BILATERAL    . COLONOSCOPY     many  . COMBINED MEDIASTINOSCOPY AND BRONCHOSCOPY  2002   biopsy  . DG [facility]  2011   ablasion rt thumb nailbed  . I&D EXTREMITY Right 07/23/2013   Procedure: IRRIGATION AND DEBRIDEMENT DIP JOINT RIGHT INDEX FINGER, EXCISE MUCOID CYST RIGHT INDEX FINGER  ;  Surgeon: Cammie Sickle., MD;  Location: [place];  Service: Orthopedics;  Laterality: Right;  . LIPOMA EXCISION    . MASS EXCISION Right 09/28/2014   Procedure: EXCISION MASS RIGHT MIDDLE FINGER;  Surgeon: Daryll Brod, MD;  Location: [place];  Service: Orthopedics;  Laterality: Right;  ANESTHESIA:  GENERAL, IV REGIONAL FAB  . MASS EXCISION Bilateral 12/24/2017   Procedure:  EXCISION MASS RIGHT SMALL, LEFT INDEX, excision left nail horn middle finger;  Surgeon: Daryll Brod, MD;  Location: [place];  Service: Orthopedics;  Laterality: Bilateral;  FAB  . NAILBED REPAIR Left 08/28/2016   Procedure: Ablation nail matrix left index finger andl left middle finger;  Surgeon: Daryll Brod, MD;  Location: [place];  Service: Orthopedics;  Laterality: Left;  . PARTIAL COLECTOMY  1998   cancer-rad/chemo  . SKIN FULL THICKNESS GRAFT Right 07/23/2013   Procedure: NAIL PLATE ABLATION WITH FULL THICKNESS SKIN GRAFT FROM RIGHT   MEDIAL ELBOW TO RIGHT LONG FINGER NAIL BED;  Surgeon: Cammie Sickle., MD;  Location: [place];  Service: Orthopedics;  Laterality: Right;  . SKIN FULL THICKNESS GRAFT Left 08/28/2016   Procedure: SKIN GRAFT FULL THICKNESS upper left arm;  Surgeon: Daryll Brod, MD;  Location: [place];  Service: Orthopedics;  Laterality: Left;    Family History  Problem Relation Age of Onset  . Cancer Mother        colon   . Hypertension Father   . CVA Father   . Obesity Brother    Social History:  reports that she quit smoking about 46 years ago. Her smoking use included cigarettes. She smoked 0.50 packs per day. She has never  used smokeless tobacco. She reports that she does not drink alcohol or use drugs.  Allergies: No Known Allergies    Current Outpatient Medications:  .  acetaminophen (TYLENOL) 325 MG tablet, Take 325-650 mg by mouth 2 (two) times daily as needed for moderate pain. , Disp: , Rfl:  .  diphenhydramine-acetaminophen (TYLENOL PM) 25-500 MG TABS tablet, Take 2 tablets by mouth at bedtime as needed (sleep). , Disp: , Rfl:  .  GLIPIZIDE XL 10 MG 24 hr tablet, TAKE HALF TO 1 TABLET TWICE DAILY BEFORE MEALS (Patient taking differently: Take 5-10 mg by mouth twice daily (if blood sugar is under 150 take 5 mg, if 150 or over take 10 mg)), Disp: 180 tablet, Rfl: 3 .  hydrochlorothiazide  (HYDRODIURIL) 25 MG tablet, Take 1 tablet (25 mg total) by mouth daily., Disp: 30 tablet, Rfl: 0 .  Insulin Detemir (LEVEMIR FLEXTOUCH) 100 UNIT/ML Pen, INJECT 12-15 UNITS SUBCUTANEOUSLY ONCE DAILY AT BEDTIME, Disp: 15 pen, Rfl: 2 .  loratadine (CLARITIN) 10 MG tablet, Take 10 mg by mouth daily as needed for allergies., Disp: , Rfl:  .  lovastatin (MEVACOR) 10 MG tablet, Take 10 mg by mouth every evening. , Disp: , Rfl:  .  metFORMIN (GLUCOPHAGE) 1000 MG tablet, TAKE 1 TABLET BY MOUTH TWICE DAILY WITH MEALS, Disp: 180 tablet, Rfl: 3 .  Multiple Vitamin (MULTIVITAMIN) tablet, Take 1 tablet by mouth daily., Disp: , Rfl:  .  ramipril (ALTACE) 10 MG capsule, Take 10 mg by mouth daily., Disp: , Rfl:  .  ranitidine (ZANTAC) 150 MG capsule, Take 150 mg by mouth 2 (two) times daily., Disp: , Rfl:  .  Tetrahydrozoline HCl (VISINE OP), Place 1 drop into both eyes daily as needed (irritation)., Disp: , Rfl:  .  glucose blood (ONE TOUCH TEST STRIPS) test strip, 1 each by Other route daily. Use as instructed, Disp: , Rfl:  .  Insulin Pen Needle (RELION PEN NEEDLES) 32G X 4 MM MISC, USE ONE  ONCE DAILY, Disp: 100 each, Rfl: 4  Review of Systems  Constitutional: Negative.   HENT: Negative.   Eyes: Negative.   Respiratory: Negative.   Cardiovascular: Negative.   Gastrointestinal: Negative.   Genitourinary: Negative.   Musculoskeletal: Positive for back pain, joint pain and myalgias. Negative for falls and neck pain.  Skin: Negative.   Neurological: Negative.   Endo/Heme/Allergies: Negative.   Psychiatric/Behavioral: Negative.    Vitals Value Min Max  Temp 97.9 F (36.6 C) 97.9 F (36.6 C)  Pulse Rate 79 79  Resp 18 18  BP: Systolic [id number]  [id number]   BP: Diastolic 60 60  SpO2 95 % 95 %      Physical Exam  Constitutional: She is oriented to person, place, and time. She appears well-developed. No distress.  Overweight  HENT:  Head: Normocephalic and atraumatic.  Right Ear: External  ear normal.  Left Ear: External ear normal.  Nose: Nose normal.  Mouth/Throat: Oropharynx is clear and moist.  Eyes: Conjunctivae and EOM are normal.  Neck: Normal range of motion. Neck supple.  Cardiovascular: Normal rate, regular rhythm, normal heart sounds and intact distal pulses.  No murmur heard. Respiratory: Effort normal. No respiratory distress. She has no wheezes.  GI: Soft. Bowel sounds are normal. She exhibits no distension. There is no tenderness.  Musculoskeletal:       Right hip: Normal.       Left hip: Normal.       Right knee: Normal.  Left knee: Normal.  Pain with motion of the lumbar spine which radiates into the right LE. Positive SLR on the right.   Neurological: She is alert and oriented to person, place, and time. No sensory deficit.  3/5 weakness on the right dorsiflexors. Otherwise LE 5/5.   Skin: No rash noted. She is not diaphoretic. No erythema.  Psychiatric: She has a normal mood and affect. Her behavior is normal.     Assessment/Plan Lumbar spinal stenosis L4-L5 She needs a lumbar decompressive laminectomy L4-L5 on the right. Dr. Gladstone Lighter discussed the risks and benefits of the procedure with the patient. She will stay overnight for observation.    H&P performed by Dr. Gladstone Lighter Documented by Ardeen Jourdain, PA-C  Nicanor Mendolia Ander Purpura, PA-C 05/09/2018, 7:55 AM

## 2018-05-09 NOTE — Op Note (Signed)
NAME: ASSIA, MEANOR MEDICAL RECORD [id number] ACCOUNT [id number] DATE OF BIRTH:13-Dec-1940 FACILITY: [facility] LOCATION: [id number] PHYSICIAN:Zayana Salvador A. Timmia Cogburn, MD  OPERATIVE REPORT  DATE OF PROCEDURE:  05/09/2018  SURGEON:  Kipp Brood. Gladstone Lighter, MD  ASSISTANT:  Ardeen Jourdain PA.  PREOPERATIVE DIAGNOSES: 1.  Partial foot drop on the right. 2.  Severe spinal stenosis is just about a complete block at L4-L5. 3.  Foraminal stenosis involving the L4 and L5 nerve roots bilaterally.  POSTOPERATIVE DIAGNOSES:   1.  Partial foot drop on the right. 2.  Severe spinal stenosis is just about a complete block at L4-L5. 3.  Foraminal stenosis involving the L4 and L5 nerve roots bilaterally.  OPERATIONS: 1.  Complete decompressive lumbar laminectomy at L4-L5 for complete block secondary to spinal stenosis. 2.  Exploration of the disk space at 4-5 on the right. 3.  Foraminotomies for the L4 and L5 nerve roots bilaterally.  DESCRIPTION OF PROCEDURE:  Under general anesthesia, the patient on a spinal frame, a routine orthopedic prep and draping was carried out.  I actually used a Wilson frame.  At this time, the appropriate time out was first carried out.  I also marked the  appropriate right leg in the holding area.  She had 2 grams of IV Ancef.  Two needles then were placed in the back for localization purposes and an x-ray was taken.  An incision was made over the thigh over the L4-L5 interspace.  The incision was  extended proximally and distally.  The self-retaining retractors were inserted.  I then identified what I felt was the L4 the L4 spinous process.    Note, an x-ray as I mentioned was taken and another one was taken after we isolated the spinous process.  We put a Kocher clamp on it and took an x-ray.  Following that, we then stripped the muscle from the lamina and spinous process bilaterally.  Good  hemostasis was maintained.  At this particular time, we then inserted the [facility]  retractors and then began our decompression.  We removed the spinous process of L4.  We removed a portion of L3 and a portion of L5 distally.  The microscope was brought  in.  We did a microscopic dissection.  We completed our decompression by removing the lamina as well.  We then identified the ligamentum flavum.  We gently lifted the ligamentum flavum from the dura.  We protected the dura with cottonoids and then  removed the ligamentum flavum in the usual fashion.  Before I did remove that ligamentum flavum.  I went superior to it with #2 Rongeurs.  I went out and decompressed the lateral recesses, especially on the right where she was extremely symptomatic.   Once we freed up that bone up on that side, we went down and did a foraminotomy distally and proximally.  Then, we protected the dura, went up under the ligamentum flavum removed all the way out into the lateral recess.  We thoroughly irrigated that out.   We then utilized the hockey stick down the foramen distally and proximally for the L4 the L5 root.  We were now opened.  I then utilized the [place] 4 and inserted that to make sure the dura now is free and the roots were free.  We thoroughly  irrigated out the area loosely applied some Gelfoam and closed the wound in layers in the usual fashion, except I left a small distal and proximal deep part of the wound open for drainage purposes.  The subcu was closed with the appropriate suture and  the skin with metal staples.    Now, prior to closing the skin, we injected 20 mL of Exparel into the soft tissue.  At the beginning of the case after the needles were removed.  I injected 20 mL of 0.5% Marcaine with epinephrine to decrease the amount of soft tissue bleeding.  Once  again, the patient had 2 grams of IV Ancef.  The surgeon, Dr. Gladstone Lighter and assistant Ardeen Jourdain.  Sterile dressings were placed over the wound site.  She was released to the recovery room.  AN/NUANCE  D:05/09/2018  T:05/09/2018 JOB:[id number]

## 2018-05-09 NOTE — Interval H&P Note (Signed)
History and Physical Interval Note:  05/09/2018 11:56 AM  Beth [name]  has presented today for surgery, with the diagnosis of L4-L5 spinal stenosis  The various methods of treatment have been discussed with the patient and family. After consideration of risks, benefits and other options for treatment, the patient has consented to  Procedure(s) with comments: Decompressive lumbar laminectomy L4-L5 (N/A) - 17min as a surgical intervention .  The patient's history has been reviewed, patient examined, no change in status, stable for surgery.  I have reviewed the patient's chart and labs.  Questions were answered to the patient's satisfaction.     [name]

## 2018-05-09 NOTE — Brief Op Note (Signed)
05/09/2018  3:37 PM  PATIENT:  Beth [name]  77 y.o. female  PRE-OPERATIVE DIAGNOSIS:  L4-L5 spinal stenosis and Foraminal Stenosis involving the L-4 and L-5 Nerve roots bilaterally.Partially Foot Drop on the Right.  POST-OPERATIVE DIAGNOSIS: Same as Pre-Op  PROCEDURE:  Procedure(s) with comments: Decompressive lumbar laminectomy L4-L5 (N/A) - 154min for SEVERE Spinal Stenosis and Foraminotomies for the L-4 and L-5 nerve roots Bilaterally  SURGEON:  Surgeon(s) and Role:    [name], MD - Primary  PHYSICIAN ASSISTANT: Ardeen Jourdain PA  ASSISTANTS: Ardeen Jourdain PA  ANESTHESIA:   general  EBL:  50 mL   BLOOD ADMINISTERED:none  DRAINS: none   LOCAL MEDICATIONS USED:  MARCAINE 20cc of 0.50% with Epinephrine at start of the case and 20cc of Exparel at the end of the case.     SPECIMEN:  No Specimen  DISPOSITION OF SPECIMEN:  N/A  COUNTS:  YES  TOURNIQUET:  * No tourniquets in log *  DICTATION: .Other Dictation: Dictation Number [id number]  PLAN OF CARE: Admit for overnight observation  PATIENT DISPOSITION:  PACU - hemodynamically stable.   Delay start of Pharmacological VTE agent (>24hrs) due to surgical blood loss or risk of bleeding: yes

## 2018-05-09 NOTE — Transfer of Care (Signed)
Immediate Anesthesia Transfer of Care Note  Patient: Beth [name]  Procedure(s) Performed: Complete decompressive lumbar laminectomy L4-L5 (N/A Back) Foraminotomy L4-L5 Root (Bilateral Back)  Patient Location: PACU  Anesthesia Type:General  Level of Consciousness: awake, alert , oriented and patient cooperative  Airway & Oxygen Therapy: Patient Spontanous Breathing and Patient connected to face mask oxygen  Post-op Assessment: Report given to RN, Post -op Vital signs reviewed and stable and Patient moving all extremities X 4  Post vital signs: stable  Last Vitals:  Vitals Value Taken Time  BP 155/80 05/09/2018  3:48 PM  Temp    Pulse 80 05/09/2018  3:54 PM  Resp 14 05/09/2018  3:54 PM  SpO2 100 % 05/09/2018  3:54 PM  Vitals shown include unvalidated device data.  Last Pain:  Vitals:   05/09/18 1101  TempSrc:   PainSc: 0-No pain      Patients Stated Pain Goal: 4 (90/90/30 1499)  Complications: No apparent anesthesia complications

## 2018-05-09 NOTE — Anesthesia Procedure Notes (Signed)
Procedure Name: Intubation Date/Time: 05/09/2018 1:38 PM Performed by: Lissa Morales, CRNA Pre-anesthesia Checklist: Patient identified, Emergency Drugs available, Suction available and Patient being monitored Patient Re-evaluated:Patient Re-evaluated prior to induction Oxygen Delivery Method: Circle system utilized Preoxygenation: Pre-oxygenation with 100% oxygen Induction Type: IV induction Ventilation: Mask ventilation without difficulty Laryngoscope Size: Mac and 3 Grade View: Grade I Tube type: Oral Tube size: 7.0 mm Number of attempts: 1 Airway Equipment and Method: Stylet and Oral airway Placement Confirmation: ETT inserted through vocal cords under direct vision,  positive ETCO2 and breath sounds checked- equal and bilateral Secured at: 21 cm Tube secured with: Tape Dental Injury: Teeth and Oropharynx as per pre-operative assessment

## 2018-05-09 NOTE — Discharge Instructions (Signed)
For the first three days, remove your dressing, tape a piece of saran wrap over your incision. Take your shower, remove the saran wrap and put a clean dressing on. After three days you can shower without the saran wrap.  No lifting or excessive bending No driving while taking pain medications Call Dr. Gladstone Lighter if any wound complications or temperature of 101 degrees F or over.  Call the office for an appointment to see Dr. Gladstone Lighter in two weeks: [phone] and ask for Dr. Charlestine Night nurse, Brunilda Payor.

## 2018-05-10 DIAGNOSIS — E119 Type 2 diabetes mellitus without complications: Secondary | ICD-10-CM | POA: Diagnosis not present

## 2018-05-10 DIAGNOSIS — M48062 Spinal stenosis, lumbar region with neurogenic claudication: Secondary | ICD-10-CM | POA: Diagnosis not present

## 2018-05-10 DIAGNOSIS — I10 Essential (primary) hypertension: Secondary | ICD-10-CM | POA: Diagnosis not present

## 2018-05-10 DIAGNOSIS — E785 Hyperlipidemia, unspecified: Secondary | ICD-10-CM | POA: Diagnosis not present

## 2018-05-10 DIAGNOSIS — L409 Psoriasis, unspecified: Secondary | ICD-10-CM | POA: Diagnosis not present

## 2018-05-10 DIAGNOSIS — M21371 Foot drop, right foot: Secondary | ICD-10-CM | POA: Diagnosis not present

## 2018-05-10 LAB — GLUCOSE, CAPILLARY: GLUCOSE-CAPILLARY: 130 mg/dL — AB (ref 70–99)

## 2018-05-10 MED ORDER — ASPIRIN EC 325 MG PO TBEC: 325.0000 mg | DELAYED_RELEASE_TABLET | Freq: Every day | ORAL | 0 refills | Status: DC

## 2018-05-10 MED ORDER — HYDROCODONE-ACETAMINOPHEN 5-325 MG PO TABS: 1.0000 | ORAL_TABLET | ORAL | 0 refills | Status: DC | PRN

## 2018-05-10 MED ORDER — METHOCARBAMOL 500 MG PO TABS: 500.0000 mg | ORAL_TABLET | Freq: Four times a day (QID) | ORAL | 0 refills | Status: DC | PRN

## 2018-05-10 NOTE — Evaluation (Signed)
Physical Therapy Evaluation Patient Details Name: Beth [name] MRN: [id number] DOB: 1941-02-01 Today's Date: 05/10/2018   History of Present Illness  Pt is s/p decompressive lumbar laminectomy L4-L5   Clinical Impression  Pt s/p back surgery and presents with functional mobility limitations 2* post op back precautions.  Pt educated on precautions, basic bed mobility, stairs and with written information provided.  Pt plans dc home this date.    Follow Up Recommendations No PT follow up    Equipment Recommendations  None recommended by PT    Recommendations for Other Services       Precautions / Restrictions Precautions Precautions: Back Precaution Booklet Issued: Yes (comment) Precaution Comments: Back precautions reviewed with pt and dtr Restrictions Weight Bearing Restrictions: No      Mobility  Bed Mobility Overal bed mobility: Needs Assistance Bed Mobility: Supine to Sit     Supine to sit: Min guard     General bed mobility comments: cues for correct log roll technique and adherence to back precautions  Transfers Overall transfer level: Needs assistance Equipment used: None Transfers: Sit to/from Stand Sit to Stand: Min guard         General transfer comment: verbal cues for posture and technique in order to adhere to precautions. Cues for hand placement.  Ambulation/Gait Ambulation/Gait assistance: Min guard;Supervision Gait Distance (Feet): 300 Feet Assistive device: Rolling walker (2 wheeled);None Gait Pattern/deviations: Step-through pattern;Decreased step length - right;Decreased step length - left;Shuffle;Trunk flexed Gait velocity: decr   General Gait Details: 150' with RW and 150' sans AD with pt demonstrating good stability   Stairs Stairs: Yes Stairs assistance: Min guard Stair Management: One rail Left;Step to pattern;Forwards Number of Stairs: 3    Wheelchair Mobility    Modified Rankin (Stroke Patients Only)       Balance  Overall balance assessment: Mild deficits observed, not formally tested                                           Pertinent Vitals/Pain Pain Assessment: No/denies pain    Home Living Family/patient expects to be discharged to:: Private residence Living Arrangements: Children;Spouse/significant other Available Help at Discharge: Family Type of Home: House Home Access: Stairs to enter Entrance Stairs-Rails: Right Entrance Stairs-Number of Steps: 3 Home Layout: One level Home Equipment: Shower seat;Bedside commode      Prior Function Level of Independence: Independent               Hand Dominance   Dominant Hand: Right    Extremity/Trunk Assessment   Upper Extremity Assessment Upper Extremity Assessment: Overall WFL for tasks assessed    Lower Extremity Assessment Lower Extremity Assessment: Overall WFL for tasks assessed    Cervical / Trunk Assessment Cervical / Trunk Assessment: Kyphotic  Communication   Communication: No difficulties  Cognition Arousal/Alertness: Awake/alert Behavior During Therapy: WFL for tasks assessed/performed Overall Cognitive Status: Within Functional Limits for tasks assessed                                        General Comments      Exercises     Assessment/Plan    PT Assessment Patient needs continued PT services  PT Problem List Decreased mobility;Decreased knowledge of precautions       PT Treatment  Interventions DME instruction;Stair training;Functional mobility training;Therapeutic activities;Therapeutic exercise;Gait training;Patient/family education    PT Goals (Current goals can be found in the Care Plan section)  Acute Rehab PT Goals Patient Stated Goal: home PT Goal Formulation: All assessment and education complete, DC therapy    Frequency Min 1X/week   Barriers to discharge        Co-evaluation               AM-PAC PT "6 Clicks" Daily Activity  Outcome  Measure Difficulty turning over in bed (including adjusting bedclothes, sheets and blankets)?: A Lot Difficulty moving from lying on back to sitting on the side of the bed? : A Lot Difficulty sitting down on and standing up from a chair with arms (e.g., wheelchair, bedside commode, etc,.)?: A Lot Help needed moving to and from a bed to chair (including a wheelchair)?: A Little Help needed walking in hospital room?: A Little Help needed climbing 3-5 steps with a railing? : A Little 6 Click Score: 15    End of Session   Activity Tolerance: Patient tolerated treatment well Patient left: in chair;with call bell/phone within reach;with family/visitor present Nurse Communication: Mobility status PT Visit Diagnosis: Difficulty in walking, not elsewhere classified (R26.2)    Time: 4695-0722 PT Time Calculation (min) (ACUTE ONLY): 22 min   Charges:   PT Evaluation $PT Eval Low Complexity: 1 Low          Pg [phone]   Shatavia Santor 05/10/2018, 4:01 PM

## 2018-05-10 NOTE — Progress Notes (Signed)
[name], MD verbalized the pt would be discharged prior to 1200 today after completing PT and OT. I noticed IV ANCEF was scheduled at 1400, but Gioffre advised no to give the 1400 scheduled ANCEF.   Pt was provided with discharge instructions and prescriptions. After discussing the pt's plan of care for discharge, the pt reported no further questions or concerns.

## 2018-05-10 NOTE — Progress Notes (Signed)
Patient stable overnight, ambulating in room. Pain controlled with PRN Tylenol.

## 2018-05-10 NOTE — Evaluation (Signed)
Occupational Therapy One Time Evaluation Patient Details Name: Beth [name] MRN: [id number] DOB: [birth year] Today's Date: 05/10/2018    History of Present Illness Pt is s/p decompressive lumbar laminectomy L4-L5    Clinical Impression   Pt able to mobilize overall at min guard assist level to the toilet and back to the chair without assistive device. Family plans to assist with LB self care tasks. Educated on AE option and need for toilet aid as pt is unable to perform own hygiene at the commode without breaking her back precautions. Family will obtain a toilet aid per their report. All education completed and reviewed handout and back precautions throughout session. Will sign off. Pt planning for d/c today.     Follow Up Recommendations  No OT follow up;Supervision - Intermittent    Equipment Recommendations  None recommended by OT    Recommendations for Other Services       Precautions / Restrictions Precautions Precautions: Back Precaution Booklet Issued: Yes (comment) Precaution Comments: Back precaution handout in room. Pt could recall 1/3 precautions. Reviewed all precautions and handout. Restrictions Weight Bearing Restrictions: No      Mobility Bed Mobility               General bed mobility comments: pt in chair when OT arrived.   Transfers Overall transfer level: Needs assistance Equipment used: None Transfers: Sit to/from Stand Sit to Stand: Min guard         General transfer comment: verbal cues for posture and technique in order to adhere to precautions. Cues for hand placement.    Balance                                           ADL either performed or assessed with clinical judgement   ADL Overall ADL's : Needs assistance/impaired Eating/Feeding: Independent;Sitting   Grooming: Wash/dry hands;Min guard;Standing   Upper Body Bathing: Set up;Supervision/ safety;Sitting   Lower Body Bathing: Moderate assistance;Sit  to/from stand   Upper Body Dressing : Set up;Supervision/safety;Sitting   Lower Body Dressing: Moderate assistance;Sit to/from stand   Toilet Transfer: Min guard;Ambulation;Comfort height toilet;Grab bars   Toileting- Clothing Manipulation and Hygiene: Moderate assistance;Sit to/from stand   Tub/ Shower Transfer: Minimal assistance;Walk-in shower     General ADL Comments: Educated pt and daughter on back precautions and how they relate to ADL. Pt is unable to perform toilet hygiene without breaking her precautions so demonstrated toilet aid option and pt's daughter states she will obtain a toilet aid for pt. Pt verbalizes understanding of need to use toilet aid in order to not break precautions. Pt needing occassional reminders for her posture and with not bending forward for sit to stand transitions in order to adhere to precautions. Educated on and practiced shower transfer with side step method and pt again needed a cue for not bending. She states family will assist with LB dressing.      Vision Baseline Vision/History: No visual deficits Patient Visual Report: No change from baseline       Perception     Praxis      Pertinent Vitals/Pain Pain Assessment: No/denies pain     Hand Dominance Right   Extremity/Trunk Assessment Upper Extremity Assessment Upper Extremity Assessment: Overall WFL for tasks assessed           Communication Communication Communication: No difficulties   Cognition Arousal/Alertness: Awake/alert  Behavior During Therapy: WFL for tasks assessed/performed Overall Cognitive Status: Within Functional Limits for tasks assessed                                     General Comments       Exercises     Shoulder Instructions      Home Living Family/patient expects to be discharged to:: Private residence Living Arrangements: Children;Spouse/significant other Available Help at Discharge: Family Type of Home: House              Bathroom Shower/Tub: Walk-in Psychologist, prison and probation services: Standard     Home Equipment: Shower seat;Bedside commode          Prior Functioning/Environment Level of Independence: Independent                 OT Problem List: Decreased strength;Decreased knowledge of use of DME or AE;Decreased knowledge of precautions      OT Treatment/Interventions: Self-care/ADL training;DME and/or AE instruction;Therapeutic activities;Patient/family education    OT Goals(Current goals can be found in the care plan section) Acute Rehab OT Goals Patient Stated Goal: home OT Goal Formulation: With patient/family  OT Frequency: Min 2X/week   Barriers to D/C:            Co-evaluation              AM-PAC PT "6 Clicks" Daily Activity     Outcome Measure Help from another person eating meals?: None Help from another person taking care of personal grooming?: A Little Help from another person toileting, which includes using toliet, bedpan, or urinal?: A Little Help from another person bathing (including washing, rinsing, drying)?: A Lot Help from another person to put on and taking off regular upper body clothing?: A Little Help from another person to put on and taking off regular lower body clothing?: A Lot 6 Click Score: 17   End of Session    Activity Tolerance: Patient tolerated treatment well Patient left: in chair;with call bell/phone within reach;with family/visitor present  OT Visit Diagnosis: Unsteadiness on feet (R26.81);Muscle weakness (generalized) (M62.81)                Time: 2751-7001 OT Time Calculation (min): 24 min Charges:  OT General Charges $OT Visit: 1 Visit OT Evaluation $OT Eval Low Complexity: 1 Low OT Treatments $Therapeutic Activity: 8-22 mins    Philippa Chester 05/10/2018, 12:49 PM

## 2018-05-10 NOTE — Progress Notes (Signed)
Subjective: 1 Day Post-Op Procedure(s) (LRB): Complete decompressive lumbar laminectomy L4-L5 (N/A) Foraminotomy L4-L5 Root (Bilateral) Patient reports pain as 1 on 0-10 scale.Doing well today. Her foot function on the right is Normal.Will DC    Objective: Vital signs in last 24 hours: Temp:  [97.7 F (36.5 C)-99.2 F (37.3 C)] 98.2 F (36.8 C) (08/17 0600) Pulse Rate:  [75-96] 75 (08/17 0600) Resp:  [11-18] 16 (08/17 0600) BP: (129-179)/(56-80) 141/58 (08/17 0600) SpO2:  [94 %-100 %] 97 % (08/17 0600) Weight:  [71.7 kg] 71.7 kg (08/16 1101)  Intake/Output from previous day: 08/16 0701 - 08/17 0700 In: 3403.4 [P.O.:980; I.V.:2423.4] Out: 2100 [Urine:2050; Blood:50] Intake/Output this shift: No intake/output data recorded.  No results for input(s): HGB in the last 72 hours. No results for input(s): WBC, RBC, HCT, PLT in the last 72 hours. No results for input(s): NA, K, CL, CO2, BUN, CREATININE, GLUCOSE, CALCIUM in the last 72 hours. No results for input(s): LABPT, INR in the last 72 hours.  Dorsiflexion/Plantar flexion intact  Doing fine.  Assessment/Plan: 1 Day Post-Op Procedure(s) (LRB): Complete decompressive lumbar laminectomy L4-L5 (N/A) Foraminotomy L4-L5 Root (Bilateral) Will DC Home.See me in two weeks.    [name] 05/10/2018, 7:21 AM

## 2018-05-12 ENCOUNTER — Encounter (HOSPITAL_COMMUNITY): Payer: Self-pay | Admitting: Orthopedic Surgery

## 2018-05-12 NOTE — Discharge Summary (Signed)
Physician Discharge Summary   Patient ID: Beth [name] MRN: [id number] DOB/AGE: Nov 28, 1940 77 y.o.  Admit date: 05/09/2018 Discharge date: 05/12/2018  Primary Diagnosis:   L4-L5 spinal stenosis  Admission Diagnoses:  Past Medical History:  Diagnosis Date  . Arthritis    neck and all over  . Cancer ([place]) 1998   hx colon ca-chemo radiation; lung; denies lung involvement   . Chronic back pain   . Diabetes mellitus without complication ([place])   . History of Raynaud's syndrome   . Hyperlipemia   . Hypertension   . Psoriasis    afflicts bed of fingernails   . 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 Wears glasses   . Wears partial dentures    top and bottom partials   Discharge Diagnoses:   Active Problems:   Spinal stenosis, lumbar region with neurogenic claudication  Procedure:  Procedure(s) (LRB): Complete decompressive lumbar laminectomy L4-L5 (N/A) Foraminotomy L4-L5 Root (Bilateral)   Consults: None  HPI: The patient is a 77 year old female who presented with the chief complaint of low back pain. She reports having this for a few months with no known injury. She reports that she has developed pain into the right lower leg as well. She has had some numbness and tinging in the leg at times as well. She did not improve with conservative treatments including NSAIDs, activity modification, and oral corticosteroids. CT myelogram showed severe spinal stenosis at L4-L5 on the right.      Laboratory Data: Hospital Outpatient Visit on 05/02/2018  Component Date Value Ref Range Status  . ABO/RH(D) 05/02/2018    Final                   Value:O POS Performed at [facility], [place] [address], [place], [place] [address]     X-Rays:Dg Lumbar Spine 2-3 Views  Result Date: 05/02/2018 CLINICAL DATA:  Spinal stenosis. EXAM: LUMBAR SPINE - 2-3 VIEW COMPARISON:  CT myelogram dated 04/09/2018 FINDINGS:  Her 5 typical lumbar segments. Mild lumbar scoliosis with convexity to the right centered at L2. Severe multilevel degenerative disc disease in the lower thoracic and upper lumbar spine with slightly less severe degenerative disc disease at L4-5 and L5-S1. Extensive aortic atherosclerosis. IMPRESSION: Multilevel degenerative disc disease. Aortic Atherosclerosis (ICD10-I70.0). Electronically Signed   By: Lorriane Shire M.D.   On: 05/02/2018 14:19   Dg Spine Portable 1 View  Result Date: 05/09/2018 CLINICAL DATA:  77 year old female with a history of lumbar spine surgery EXAM: PORTABLE SPINE - 1 VIEW COMPARISON:  None. FINDINGS: Intraoperative cross-table lateral of the lumbar spine. Tissue retractor in the posterior soft tissues with a surgical curette at the L4-L5 facet IMPRESSION: Cross-table lateral of the lumbar spine identifying the L4-L5 facet. Electronically Signed   By: Corrie Mckusick D.O.   On: 05/09/2018 14:49   Dg Spine Portable 1 View  Result Date: 05/09/2018 CLINICAL DATA:  Spinal stenosis, surgical localization EXAM: PORTABLE SPINE - 1 VIEW COMPARISON:  Earlier film of the same day, and CT myelogram 13/24/4010 FINDINGS: Metallic instrument tip from posterior approach projects over the L4 spinous process. IMPRESSION: Intraoperative localization. Electronically Signed   By: Lucrezia Europe M.D.   On: 05/09/2018 14:28   Dg Spine Portable 1 View  Result Date: 05/09/2018 CLINICAL DATA:  Lumbar spine surgery. EXAM: PORTABLE SPINE - 1 VIEW COMPARISON:  05/02/2018. FINDINGS: Based  on previous level labeling, 2 localizers are demonstrated, 1 with its tip overlying the L5 spinous process and the other with its tip at the inferior margin of the L4 spinous process. Multilevel degenerative changes and diffuse atheromatous arterial calcifications. Surgical clips. IMPRESSION: Localizers, as described above. Electronically Signed   By: Claudie Revering M.D.   On: 05/09/2018 14:09    EKG: Orders placed or  performed during the hospital encounter of 12/24/17  . EKG 12 lead  . EKG 12 lead  . EKG 12-Lead  . EKG 12-Lead     Hospital Course: Patient was admitted to [facility] and taken to the OR and underwent the above state procedure without complications.  Patient tolerated the procedure well and was later transferred to the recovery room and then to the orthopaedic floor for postoperative care.  They were given PO and IV analgesics for pain control following their surgery.  They were given 24 hours of postoperative antibiotics.   PT was consulted postop to assist with mobility and transfers.  The patient was allowed to be WBAT with therapy and was taught back precautions. Discharge planning was consulted to help with postop disposition and equipment needs.  Patient had a good night on the evening of surgery and started to get up OOB with therapy on day one. Patient was seen in rounds and was ready to go home on day one.  They were given discharge instructions and dressing directions.  They were instructed on when to follow up in the office with Dr. Gladstone Lighter.   Diet: Cardiac diet Activity:WBAT Follow-up:in 2 weeks Disposition - Home Discharged Condition: stable   Discharge Instructions    Call MD / Call 911   Complete by:  As directed    If you experience chest pain or shortness of breath, CALL 911 and be transported to the hospital emergency room.  If you develope a fever above 101 F, pus (white drainage) or increased drainage or redness at the wound, or calf pain, call your surgeon's office.   Constipation Prevention   Complete by:  As directed    Drink plenty of fluids.  Prune juice may be helpful.  You may use a stool softener, such as Colace (over the counter) 100 mg twice a day.  Use MiraLax (over the counter) for constipation as needed.   Diet - low sodium heart healthy   Complete by:  As directed    Discharge instructions   Complete by:  As directed    For the first three  days, remove your dressing, tape a piece of saran wrap over your incision. Take your shower, remove the saran wrap and put a clean dressing on. After three days you can shower without the saran wrap.  No lifting or excessive bending No driving while taking pain medications Call Dr. Gladstone Lighter if any wound complications or temperature of 101 degrees F or over.  Call the office for an appointment to see Dr. Gladstone Lighter in two weeks: [phone] and ask for Dr. Charlestine Night nurse, Brunilda Payor.   Increase activity slowly as tolerated   Complete by:  As directed      Allergies as of 05/10/2018   No Known Allergies     Medication List    TAKE these medications   acetaminophen 325 MG tablet Commonly known as:  TYLENOL Take 325-650 mg by mouth 2 (two) times daily as needed for moderate pain.   aspirin EC 325 MG tablet Take 1 tablet (325 mg total) by  mouth daily.   diphenhydramine-acetaminophen 25-500 MG Tabs tablet Commonly known as:  TYLENOL PM Take 2 tablets by mouth at bedtime as needed (sleep).   GLIPIZIDE XL 10 MG 24 hr tablet Generic drug:  glipiZIDE TAKE HALF TO 1 TABLET TWICE DAILY BEFORE MEALS What changed:  See the new instructions.   hydrochlorothiazide 25 MG tablet Commonly known as:  HYDRODIURIL Take 1 tablet (25 mg total) by mouth daily.   HYDROcodone-acetaminophen 5-325 MG tablet Commonly known as:  NORCO/VICODIN Take 1 tablet by mouth every 4 (four) hours as needed for moderate pain ((score 4 to 6)).   Insulin Detemir 100 UNIT/ML Pen Commonly known as:  LEVEMIR INJECT 12-15 UNITS SUBCUTANEOUSLY ONCE DAILY AT BEDTIME   Insulin Pen Needle 32G X 4 MM Misc USE ONE  ONCE DAILY   loratadine 10 MG tablet Commonly known as:  CLARITIN Take 10 mg by mouth daily as needed for allergies.   lovastatin 10 MG tablet Commonly known as:  MEVACOR Take 10 mg by mouth every evening.   metFORMIN 1000 MG tablet Commonly known as:  GLUCOPHAGE TAKE 1 TABLET BY MOUTH TWICE DAILY  WITH MEALS   methocarbamol 500 MG tablet Commonly known as:  ROBAXIN Take 1 tablet (500 mg total) by mouth every 6 (six) hours as needed for muscle spasms.   multivitamin tablet Take 1 tablet by mouth daily.   ONE TOUCH TEST STRIPS test strip Generic drug:  glucose blood 1 each by Other route daily. Use as instructed   ramipril 10 MG capsule Commonly known as:  ALTACE Take 10 mg by mouth daily.   ranitidine 150 MG capsule Commonly known as:  ZANTAC Take 150 mg by mouth 2 (two) times daily.   VISINE OP Place 1 drop into both eyes daily as needed (irritation).      Follow-up Information    [name], MD. Schedule an appointment as soon as possible for a visit in 2 week(s).   Specialty:  Orthopedic Surgery Contact information: [address] [place] [address] [phone]           Signed: Ardeen Jourdain, PA-C Orthopaedic Surgery 05/12/2018, 6:48 AM

## 2018-05-12 NOTE — Anesthesia Postprocedure Evaluation (Signed)
Anesthesia Post Note  Patient: Beth [name]  Procedure(s) Performed: Complete decompressive lumbar laminectomy L4-L5 (N/A Back) Foraminotomy L4-L5 Root (Bilateral Back)     Patient location during evaluation: PACU Anesthesia Type: General Level of consciousness: awake Pain management: pain level controlled Vital Signs Assessment: post-procedure vital signs reviewed and stable Respiratory status: spontaneous breathing Cardiovascular status: stable Postop Assessment: adequate PO intake Anesthetic complications: no    Last Vitals:  Vitals:   05/10/18 0227 05/10/18 0600  BP: (!) 129/56 (!) 141/58  Pulse: 79 75  Resp: 15 16  Temp: 36.7 C 36.8 C  SpO2: 95% 97%    Last Pain:  Vitals:   05/10/18 0909  TempSrc:   PainSc: 0-No pain                 [name]

## 2018-05-21 ENCOUNTER — Other Ambulatory Visit: Payer: Self-pay | Admitting: Family Medicine

## 2018-05-21 DIAGNOSIS — Z1231 Encounter for screening mammogram for malignant neoplasm of breast: Secondary | ICD-10-CM

## 2018-05-30 ENCOUNTER — Other Ambulatory Visit: Payer: Self-pay | Admitting: Internal Medicine

## 2018-06-09 ENCOUNTER — Other Ambulatory Visit: Payer: Self-pay | Admitting: Internal Medicine

## 2018-07-02 ENCOUNTER — Ambulatory Visit
Admission: RE | Admit: 2018-07-02 | Discharge: 2018-07-02 | Disposition: A | Discharge status: Home / Self Care | Payer: Medicare Other | Source: Ambulatory Visit | Attending: Family Medicine | Admitting: Family Medicine

## 2018-07-02 DIAGNOSIS — Z1231 Encounter for screening mammogram for malignant neoplasm of breast: Secondary | ICD-10-CM

## 2018-08-18 ENCOUNTER — Encounter: Payer: Self-pay | Admitting: Internal Medicine

## 2018-08-18 ENCOUNTER — Ambulatory Visit (INDEPENDENT_AMBULATORY_CARE_PROVIDER_SITE_OTHER): Payer: Medicare Other | Admitting: Internal Medicine

## 2018-08-18 VITALS — BP 148/80 | HR 80 | Ht 62.0 in | Wt 160.0 lb

## 2018-08-18 DIAGNOSIS — E663 Overweight: Secondary | ICD-10-CM

## 2018-08-18 DIAGNOSIS — E785 Hyperlipidemia, unspecified: Secondary | ICD-10-CM | POA: Diagnosis not present

## 2018-08-18 DIAGNOSIS — E1165 Type 2 diabetes mellitus with hyperglycemia: Secondary | ICD-10-CM | POA: Diagnosis not present

## 2018-08-18 LAB — POCT GLYCOSYLATED HEMOGLOBIN (HGB A1C): HEMOGLOBIN A1C: 6.8 % — AB (ref 4.0–5.6)

## 2018-08-18 MED ORDER — GLIPIZIDE ER 10 MG PO TB24: ORAL_TABLET | ORAL | 3 refills | Status: DC

## 2018-08-18 NOTE — Patient Instructions (Addendum)
Please continue: - Levemir 12-15 units but move this in am - Metformin 1000 mg 2x a day, with meals - Glipizide XL 10 mg before b'fast and half of a tablet (5 mg) before dinner  Please return in  4 months with your sugar log.

## 2018-08-18 NOTE — Progress Notes (Signed)
Patient ID: Beth [name], female   DOB: April 05, 1941, 77 y.o.   MRN: [id number]  HPI: Beth [name] is a 77 y.o.-year-old female, returning for f/u for DM2, dx in 1990s, insulin-dependent, uncontrolled, without long-term complications (?  CKD). Last visit 4 months ago.  She had back surgery 2/2 spinal stenosis  - 04/2018.  She recovered very well after the surgery.  She also recently had the flu for 3 weeks.  Sugars are higher then.  Last hemoglobin A1c was: Lab Results  Component Value Date   HGBA1C 7.1 (A) 04/15/2018   HGBA1C 7.1 10/14/2017   HGBA1C 7.2 06/14/2017  01/16/2017: HbA1c 7.4% 01/25/2016: HbA1c 7.0%, glucose 73  Pt is on a regimen of:ue: - Levemir 12-15 units at bedtime - Metformin 1000 mg 2x a day, with meals - Glipizide XL 10 mg before b'fast and half of a tablet (5 mg) before dinner -using this dose rarely Patient was previously on Januvia and then Victoza. Lipase was elevated, at 74 (0-59) on 07/08/2015, then 69 on 07/18/2015. She was on Invokana 100 mg in a.m. >> yeast infection.  Pt checks her sugars once a day: - am:  66, 89-139 >> 88-131, 147 (steroids) >> 82-119, 130 - 2h after b'fast:  123, 135 >> n/c >> 171-191, 210 - before lunch:  84, 145 (steroids) >> 93-125, 142 - 2h after lunch:1 177 >> 169, 181 >> n/c >> 116-197, 208 - before dinner: n/c >> 115, 167, 190 (steroids) >> 108-117 - 2h after dinner:159-186, 211, 306 >> 142-187, 212 >> 178-213 - bedtime: 134-192, 217 >> 133-170 >> n/c >> 109-186, 211, 215 - nighttime: n/c >> 180, 212 >> n/c Lowest sugar was 66 >> 84 >> 82; she has hypoglycemia awareness at in the 70s. Highest sugar was 306 >> 190 >> 215.  Glucometer: ReliOn  Pt's meals are: - Breakfast: scrambled eggs, toast or cereals or yoghurt - Lunch: crackers and cheese, carrots, apple - Dinner: meat + salad/veggies + starch - Snacks: 2-3: cashews, cheese and carrots, almost milk + graham crackers  -No history of CKD, last BUN/creatinine was  higher, though: Lab Results  Component Value Date   BUN 39 (H) 05/02/2018   CREATININE 1.26 (H) 05/02/2018  01/16/2017: BUN/creatinine 21/0.96, glucose 95 On ramipril. -+ HL; last set of lipids:  01/16/2017: 141/139/45/69 Lab Results  Component Value Date   CHOL 151 01/28/2015   HDL 37 01/28/2015   LDLCALC 71 01/28/2015   TRIG 216 (A) 01/28/2015  On lovastatin. - last eye exam was 02/2018: No DR, no macular edema. Dr. Gershon Crane. -Denies numbness and tingling in her feet.   She has a history of colon cancer ~2000. She remembers that the mass perforated her colon at that time.  ROS: Constitutional: no weight gain/no weight loss, no fatigue, no subjective hyperthermia, no subjective hypothermia Eyes: no blurry vision, no xerophthalmia ENT: no sore throat, no nodules palpated in neck, no dysphagia, no odynophagia, no hoarseness Cardiovascular: no CP/no SOB/no palpitations/no leg swelling Respiratory: no cough/no SOB/no wheezing Gastrointestinal: no N/no V/no D/no C/no acid reflux Musculoskeletal: no muscle aches/no joint aches Skin: no rashes, no hair loss Neurological: no tremors/no numbness/no tingling/no dizziness  I reviewed pt's medications, allergies, PMH, social hx, family hx, and changes were documented in the history of present illness. Otherwise, unchanged from my initial visit note.  Past Medical History:  Diagnosis Date  . Arthritis    neck and all over  . Cancer ([place]) 1998   hx colon ca-chemo  radiation; lung; denies lung involvement   . Chronic back pain   . Diabetes mellitus without complication ([place])   . History of Raynaud's syndrome   . Hyperlipemia   . Hypertension   . Psoriasis    afflicts bed of fingernails   . 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 Wears glasses   . Wears partial dentures    top and bottom partials   Past Surgical History:  Procedure Laterality Date  . ABDOMINAL  HYSTERECTOMY    . APPENDECTOMY    . CATARACT EXTRACTION, BILATERAL    . COLONOSCOPY     many  . COMBINED MEDIASTINOSCOPY AND BRONCHOSCOPY  2002   biopsy  . DG ARTHRO [facility]*  2011   ablasion rt thumb nailbed  . FORAMINOTOMY 1 LEVEL Bilateral 05/09/2018   Procedure: Foraminotomy L4-L5 Root;  Surgeon: [name], MD;  Location: [facility];  Service: Orthopedics;  Laterality: Bilateral;  . I&D EXTREMITY Right 07/23/2013   Procedure: IRRIGATION AND DEBRIDEMENT DIP JOINT RIGHT INDEX FINGER, EXCISE MUCOID CYST RIGHT INDEX FINGER  ;  Surgeon: Cammie Sickle., MD;  Location: [place];  Service: Orthopedics;  Laterality: Right;  . LIPOMA EXCISION    . LUMBAR LAMINECTOMY/DECOMPRESSION MICRODISCECTOMY N/A 05/09/2018   Procedure: Complete decompressive lumbar laminectomy L4-L5;  Surgeon: [name], MD;  Location: [facility];  Service: Orthopedics;  Laterality: N/A;  111min  . MASS EXCISION Right 09/28/2014   Procedure: EXCISION MASS RIGHT MIDDLE FINGER;  Surgeon: Daryll Brod, MD;  Location: [place];  Service: Orthopedics;  Laterality: Right;  ANESTHESIA:  GENERAL, IV REGIONAL FAB  . MASS EXCISION Bilateral 12/24/2017   Procedure: EXCISION MASS RIGHT SMALL, LEFT INDEX, excision left nail horn middle finger;  Surgeon: Daryll Brod, MD;  Location: [place];  Service: Orthopedics;  Laterality: Bilateral;  FAB  . NAILBED REPAIR Left 08/28/2016   Procedure: Ablation nail matrix left index finger andl left middle finger;  Surgeon: Daryll Brod, MD;  Location: [place];  Service: Orthopedics;  Laterality: Left;  . PARTIAL COLECTOMY  1998   cancer-rad/chemo  . SKIN FULL THICKNESS GRAFT Right 07/23/2013   Procedure: NAIL PLATE ABLATION WITH FULL THICKNESS SKIN GRAFT FROM RIGHT   MEDIAL ELBOW TO RIGHT LONG FINGER NAIL BED;  Surgeon: Cammie Sickle., MD;  Location: [place];  Service: Orthopedics;  Laterality: Right;  . SKIN  FULL THICKNESS GRAFT Left 08/28/2016   Procedure: SKIN GRAFT FULL THICKNESS upper left arm;  Surgeon: Daryll Brod, MD;  Location: [place];  Service: Orthopedics;  Laterality: Left;   Social History   Social History  . Marital Status: Married    Spouse Name: N/A  . Number of Children: 3   Occupational History  . retired   Social History Main Topics  . Smoking status: Former Smoker -- 0.50 packs/day    Types: Cigarettes    Quit date: 07/22/1971  . Smokeless tobacco: Not on file  . Alcohol Use: No     Comment: rare  . Drug Use: No  No Known Allergies Family History  Problem Relation Age of Onset  . Cancer Mother        colon   . Hypertension Father   . CVA Father   . Obesity Brother    Current Outpatient Medications  Medication Sig Dispense Refill  . acetaminophen (  TYLENOL) 325 MG tablet Take 325-650 mg by mouth 2 (two) times daily as needed for moderate pain.     Marland Kitchen aspirin EC 325 MG tablet Take 1 tablet (325 mg total) by mouth daily. 30 tablet 0  . diphenhydramine-acetaminophen (TYLENOL PM) 25-500 MG TABS tablet Take 2 tablets by mouth at bedtime as needed (sleep).     Marland Kitchen GLIPIZIDE XL 10 MG 24 hr tablet TAKE 1/2 TO 1 (ONE-HALF TO ONE) TABLET BY MOUTH TWICE DAILY BEFORE MEAL(S) 180 tablet 3  . glucose blood (ONE TOUCH TEST STRIPS) test strip 1 each by Other route daily. Use as instructed    . hydrochlorothiazide (HYDRODIURIL) 25 MG tablet Take 1 tablet (25 mg total) by mouth daily. 30 tablet 0  . HYDROcodone-acetaminophen (NORCO/VICODIN) 5-325 MG tablet Take 1 tablet by mouth every 4 (four) hours as needed for moderate pain ((score 4 to 6)). 42 tablet 0  . Insulin Detemir (LEVEMIR FLEXTOUCH) 100 UNIT/ML Pen INJECT 12-15 UNITS SUBCUTANEOUSLY ONCE DAILY AT BEDTIME 15 pen 2  . Insulin Pen Needle (RELION PEN NEEDLES) 32G X 4 MM MISC USE ONE  ONCE DAILY 100 each 4  . LEVEMIR FLEXTOUCH 100 UNIT/ML Pen INJECT 15 UNITS SUBCUTANEOUSLY ONCE DAILY AT BEDTIME 15 pen 2  .  loratadine (CLARITIN) 10 MG tablet Take 10 mg by mouth daily as needed for allergies.    Marland Kitchen lovastatin (MEVACOR) 10 MG tablet Take 10 mg by mouth every evening.     . metFORMIN (GLUCOPHAGE) 1000 MG tablet TAKE 1 TABLET BY MOUTH TWICE DAILY WITH MEALS 180 tablet 3  . methocarbamol (ROBAXIN) 500 MG tablet Take 1 tablet (500 mg total) by mouth every 6 (six) hours as needed for muscle spasms. 40 tablet 0  . Multiple Vitamin (MULTIVITAMIN) tablet Take 1 tablet by mouth daily.    . ramipril (ALTACE) 10 MG capsule Take 10 mg by mouth daily.    . ranitidine (ZANTAC) 150 MG capsule Take 150 mg by mouth 2 (two) times daily.    . Tetrahydrozoline HCl (VISINE OP) Place 1 drop into both eyes daily as needed (irritation).     No current facility-administered medications for this visit.    PE: BP (!) 148/80   Pulse 80   Ht 5\' 2"  (1.575 m) Comment: measured  Wt 160 lb (72.6 kg)   SpO2 96%   BMI 29.26 kg/m  Body mass index is 28.9 kg/m. Wt Readings from Last 3 Encounters:  08/18/18 160 lb (72.6 kg)  05/09/18 158 lb (71.7 kg)  05/02/18 160 lb (72.6 kg)   Constitutional: overweight, in NAD Eyes: PERRLA, EOMI, no exophthalmos ENT: moist mucous membranes, no thyromegaly, no cervical lymphadenopathy Cardiovascular: RRR, No MRG Respiratory: CTA B Gastrointestinal: abdomen soft, NT, ND, BS+ Musculoskeletal: no deformities, strength intact in all 4 Skin: moist, warm, no rashes Neurological: no tremor with outstretched hands, DTR normal in all 4  ASSESSMENT: 1. DM2, insulin-dependent, uncontrolled, without long complications, but with hyperglycemia  2. HL  3.  Overweight  PLAN:  1. Patient with long-standing, fairly well-controlled type 2 diabetes, on oral antidiabetic regimen and basal insulin.  At last visit, sugars were excellent in the morning but she was not checking them later in the day except around the time of her previous steroid course.  The sugars are not very high.  She was telling me  that she usually had to have a snack at night to avoid dropping her sugars too much in the morning.  We decreased her Levemir  dose at that time.  We discussed that if she has a repeat steroid injection, she may need to take the second dose of glipizide at all times and may even need mealtime insulin.  At last visit, HbA1c was stable, at 7.1%, which is at goal for her due to age. - At this visit, she is doing a great job checking her sugars at different times of the day.  They are at goal in the morning but they do increase after meals.  I suspect that she does have a degree of insulin deficiency, but would like to avoid mealtime insulin if possible.  We discussed about continuing the same doses of medicines, but moving Levemir in the morning to hopefully cover her meals a little better.  She agrees with this plan. - I suggested to:  Patient Instructions  Please continue: - Levemir 12-15 units but move this in am - Metformin 1000 mg 2x a day, with meals - Glipizide XL 10 mg before b'fast and half of a tablet (5 mg) before dinner  Please return in  4 months with your sugar log.   - today, HbA1c is 6.8% (better) - continue checking sugars at different times of the day - check 1x a day, rotating checks - advised for yearly eye exams >> she is UTD - Return to clinic in 4 mo with sugar log     2. HL - Reviewed latest lipid panel from 12/2016: Excellent LDL - she goes back to see her PCP in 12/2018 - Continues lovastatin without side effects.  3. Overweight -Weight is stable since last visit  Philemon Kingdom, MD PhD [facility]

## 2018-08-18 NOTE — Addendum Note (Signed)
Addended by: Cardell Peach I on: 08/18/2018 09:43 AM   Modules accepted: Orders

## 2018-08-19 DIAGNOSIS — H1131 Conjunctival hemorrhage, right eye: Secondary | ICD-10-CM | POA: Diagnosis not present

## 2018-08-28 ENCOUNTER — Other Ambulatory Visit: Payer: Self-pay

## 2018-08-28 MED ORDER — INSULIN PEN NEEDLE 32G X 4 MM MISC: 99 refills | Status: DC

## 2018-09-25 ENCOUNTER — Other Ambulatory Visit: Payer: Self-pay

## 2018-09-25 MED ORDER — METFORMIN HCL 1000 MG PO TABS: 1000.0000 mg | ORAL_TABLET | Freq: Two times a day (BID) | ORAL | 3 refills | Status: DC

## 2018-10-09 ENCOUNTER — Other Ambulatory Visit: Payer: Self-pay

## 2018-10-09 MED ORDER — GLIPIZIDE ER 10 MG PO TB24: ORAL_TABLET | ORAL | 3 refills | Status: DC

## 2018-10-17 ENCOUNTER — Telehealth: Payer: Self-pay | Admitting: Internal Medicine

## 2018-10-17 ENCOUNTER — Other Ambulatory Visit: Payer: Self-pay

## 2018-10-17 MED ORDER — GLIPIZIDE ER 10 MG PO TB24: ORAL_TABLET | ORAL | 3 refills | Status: DC

## 2018-10-17 MED ORDER — INSULIN DETEMIR 100 UNIT/ML FLEXPEN: PEN_INJECTOR | SUBCUTANEOUS | 2 refills | Status: DC

## 2018-10-17 MED ORDER — INSULIN DEGLUDEC 100 UNIT/ML ~~LOC~~ SOPN: PEN_INJECTOR | SUBCUTANEOUS | 2 refills | Status: DC

## 2018-10-17 NOTE — Telephone Encounter (Signed)
Basaglar is not covered under her insurance plan.

## 2018-10-17 NOTE — Telephone Encounter (Signed)
Beth [name] sent.  I called patient, no answer or VM.

## 2018-10-17 NOTE — Addendum Note (Signed)
Addended by: Cardell Peach I on: 10/17/2018 04:20 PM   Modules accepted: Orders

## 2018-10-17 NOTE — Telephone Encounter (Signed)
Beth [name] is covered. Same dose?

## 2018-10-17 NOTE — Telephone Encounter (Signed)
Insulin Detemir (LEVEMIR FLEXTOUCH) 100 UNIT/ML Pen   Patient stated that [employer] would be calling our office at some point .  She also stated that this medication is almost $500 and she cant afford that. She would like to know if there is some way she can get this cheaper.

## 2018-10-17 NOTE — Telephone Encounter (Signed)
Let's try to send Basaglar same dose.

## 2018-10-17 NOTE — Addendum Note (Signed)
Addended by: Cardell Peach I on: 10/17/2018 02:42 PM   Modules accepted: Orders

## 2018-10-17 NOTE — Telephone Encounter (Signed)
Please ask her to check with your insurance if they cover: - Lantus, Toujeo, Tyler Aas  If not, may need NPH 2x a day

## 2018-10-17 NOTE — Telephone Encounter (Signed)
Yes

## 2018-10-21 ENCOUNTER — Other Ambulatory Visit: Payer: Self-pay

## 2018-10-21 MED ORDER — GLIPIZIDE ER 10 MG PO TB24: ORAL_TABLET | ORAL | 3 refills | Status: DC

## 2018-10-21 NOTE — Telephone Encounter (Signed)
Tried calling patient, it rang then it disconnected.

## 2018-10-23 ENCOUNTER — Other Ambulatory Visit: Payer: Self-pay

## 2018-10-23 MED ORDER — INSULIN PEN NEEDLE 32G X 5 MM MISC: 4 refills | Status: DC

## 2018-12-17 ENCOUNTER — Ambulatory Visit: Payer: Medicare Other | Admitting: Internal Medicine

## 2019-03-03 DIAGNOSIS — Z794 Long term (current) use of insulin: Secondary | ICD-10-CM | POA: Diagnosis not present

## 2019-03-03 DIAGNOSIS — E119 Type 2 diabetes mellitus without complications: Secondary | ICD-10-CM | POA: Diagnosis not present

## 2019-03-03 DIAGNOSIS — Z961 Presence of intraocular lens: Secondary | ICD-10-CM | POA: Diagnosis not present

## 2019-03-03 DIAGNOSIS — H40013 Open angle with borderline findings, low risk, bilateral: Secondary | ICD-10-CM | POA: Diagnosis not present

## 2019-03-03 LAB — HM DIABETES EYE EXAM

## 2019-03-26 DIAGNOSIS — E782 Mixed hyperlipidemia: Secondary | ICD-10-CM | POA: Diagnosis not present

## 2019-03-26 DIAGNOSIS — M47816 Spondylosis without myelopathy or radiculopathy, lumbar region: Secondary | ICD-10-CM | POA: Diagnosis not present

## 2019-03-26 DIAGNOSIS — Z85038 Personal history of other malignant neoplasm of large intestine: Secondary | ICD-10-CM | POA: Diagnosis not present

## 2019-03-26 DIAGNOSIS — E118 Type 2 diabetes mellitus with unspecified complications: Secondary | ICD-10-CM | POA: Diagnosis not present

## 2019-03-26 DIAGNOSIS — K219 Gastro-esophageal reflux disease without esophagitis: Secondary | ICD-10-CM | POA: Diagnosis not present

## 2019-03-26 DIAGNOSIS — N189 Chronic kidney disease, unspecified: Secondary | ICD-10-CM | POA: Diagnosis not present

## 2019-03-26 DIAGNOSIS — I10 Essential (primary) hypertension: Secondary | ICD-10-CM | POA: Diagnosis not present

## 2019-03-30 IMAGING — CT CT L SPINE W/ CM
1 of 6 series · 7 of 14 positions shown, 9 images · non-contrast
Comparison: None

CLINICAL DATA: Spinal stenosis, neurogenic claudication, RIGHT leg
radicular symptoms.
TECHNIQUE: Contiguous axial images were obtained through the Lumbar spine after
the intrathecal infusion of infusion. Coronal and sagittal
reconstructions were obtained of the axial image sets.

[Series 3: l spine soft · axial · 0.34mm/px · z∈[-312,-104]mm · 7 of 93 slices shown, 9 images]
[im 12/93  soft-tissue]
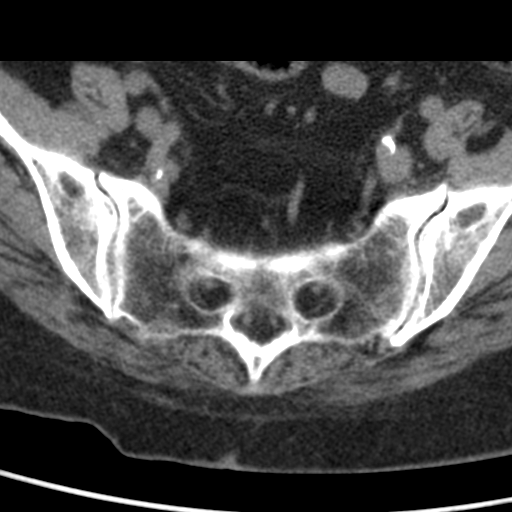
[im 12/93  bone]
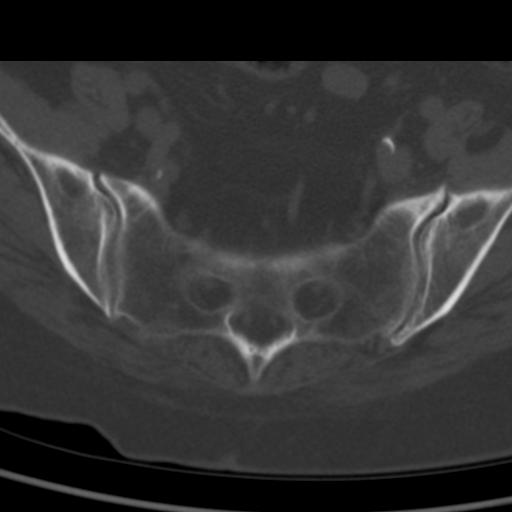
[im 24/93  bone]
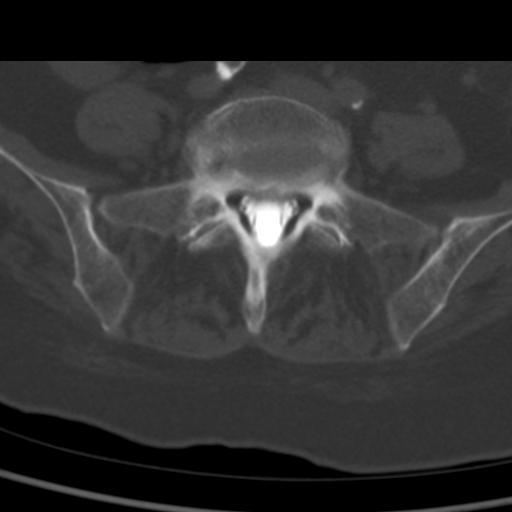
[im 35/93  bone]
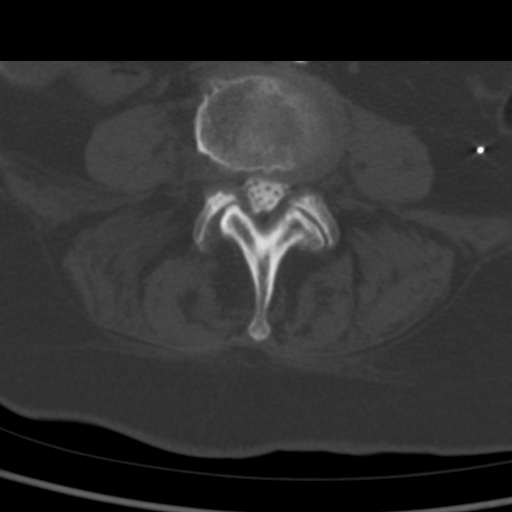
[im 47/93  bone]
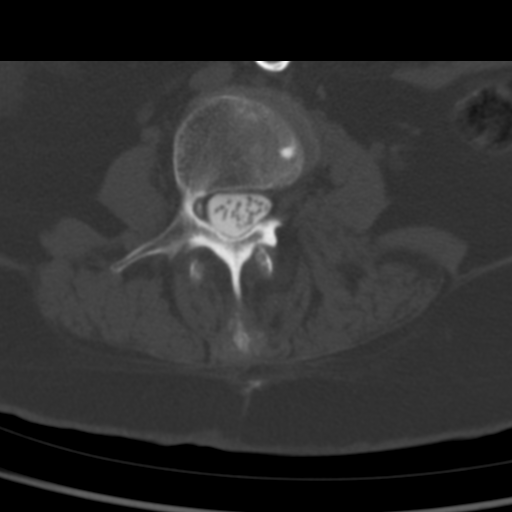
[im 58/93  soft-tissue]
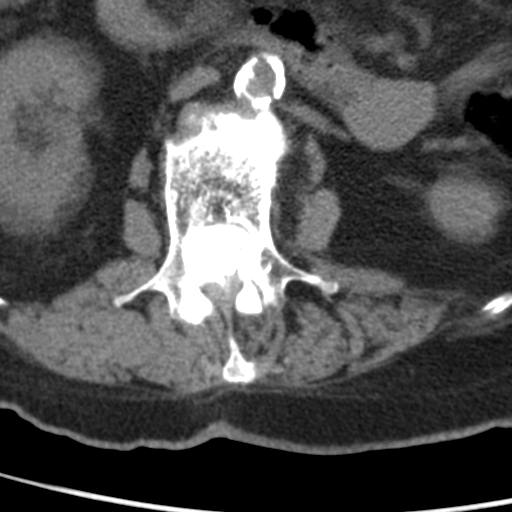
[im 58/93  bone]
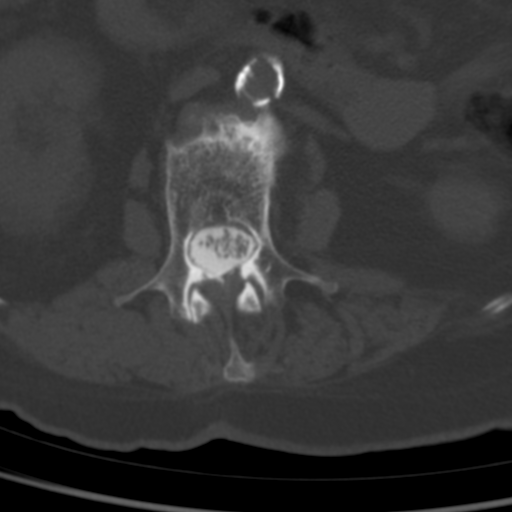
[im 70/93  bone]
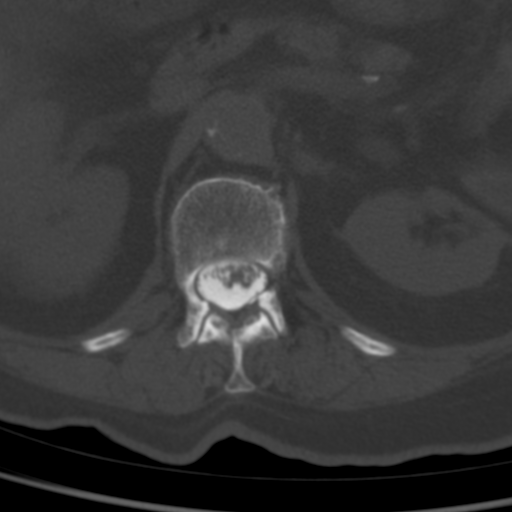
[im 81/93  bone]
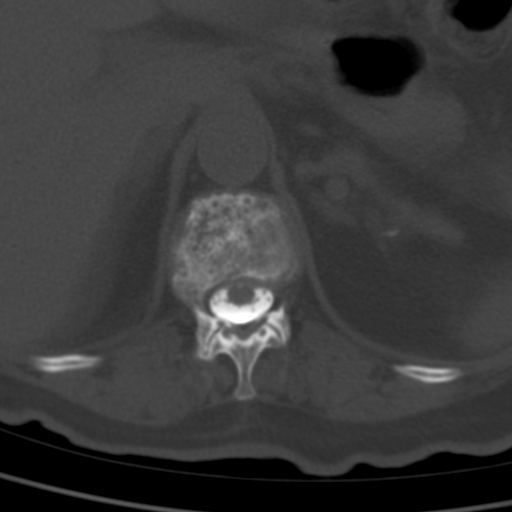

[7 of 14 positions shown; findings below may reference images not displayed]

EXAM:
LUMBAR MYELOGRAM

FLUOROSCOPY TIME:  32 seconds corresponding to a Dose Area Product
of 224.54 Gy*m2

PROCEDURE:
After thorough discussion of risks and benefits of the procedure
including bleeding, infection, injury to nerves, blood vessels,
adjacent structures as well as headache and CSF leak, written and
oral informed consent was obtained. Consent was obtained by Dr. Bladimir
Delia. Time out form was completed.

Patient was positioned prone on the fluoroscopy table. Local
anesthesia was provided with 1% lidocaine without epinephrine after
prepped and draped in the usual sterile fashion. Puncture was
performed at L3-L4 using a 3 1/2 inch 22-gauge spinal needle via
midline approach. Using a single pass through the dura, the needle
was placed within the thecal sac, with return of clear CSF. 15 mL of
Isovue X-SGG was injected into the thecal sac, with normal
opacification of the nerve roots and cauda equina consistent with
free flow within the subarachnoid space.

I personally performed the lumbar puncture and administered the
intrathecal contrast. I also personally supervised acquisition of
the myelogram images.
FINDINGS: LUMBAR MYELOGRAM FINDINGS:

Good opacification lumbar subarachnoid space. Sigmoid shaped lumbar
spine, asymmetric loss of interspace height at L1-2 on the LEFT,
compensatory asymmetric loss of interspace height at L4-5 on the
RIGHT. RIGHT L5 neural impingement is evident.

With patient standing, the RIGHT subarticular zone narrowing at L4-5
is augmented. There is mild to moderate stenosis at this level
related to ventral disc material and ligamentum flavum infolding.

3 mm retrolisthesis at L3-4 is noted with patient prone for
myelography, and is unchanged with patient standing through flexion
and extension.

Advanced aortic atherosclerosis. No aneurysmal dilatation. Surgical
clips related to partial colectomy.

CT LUMBAR MYELOGRAM FINDINGS:

Segmentation: Normal.

Alignment: Mild scoliotic deformity, sigmoid shaped, described
above. 1 mm retrolisthesis L3 on L4. Trace retrolisthesis L1-2 and
L2-3.

Vertebrae: No worrisome osseous lesion.Osseous spurring and endplate
sclerosis most pronounced at the upper lumbar levels.

Conus medullaris: Normal in size and location, terminating L1.

Paraspinal tissues: Advanced atherosclerosis. No paravertebral mass.
No hydronephrosis.

Disc levels:

L1-L2: Advanced disc space narrowing. Central and leftward
protrusion. Posterior element hypertrophy. Mild stenosis. LEFT L1
and LEFT L2 neural impingement.

L2-L3: Advanced disc space narrowing. Central and leftward
protrusion with osseous spurring. Mild facet arthropathy. Mild
stenosis. LEFT L2 and LEFT L3 neural impingement.

L3-L4: Central protrusion. Mild facet arthropathy and ligamentum
flavum infolding. Mild stenosis. Borderline RIGHT subarticular zone
narrowing could affect the L4 nerve root.

L4-L5: Central and rightward protrusion. Disc space narrowing, worse
on the RIGHT with far-lateral, foraminal, and extraforaminal osseous
spurring. Posterior element hypertrophy affects the facets and
ligamentum flavum. Moderate stenosis. RIGHT greater than LEFT L4 and
L5 neural impingement.

L5-S1: Disc space narrowing most pronounced to the RIGHT. Shallow
central protrusion. Osseous spurring extends to the RIGHT-sided
foramen and beyond. Facet arthropathy. No subarticular zone
narrowing or stenosis. Possible irritation of the RIGHT L5 nerve
root in the foramen and beyond.
IMPRESSION: LUMBAR MYELOGRAM IMPRESSION:

The dominant area of spinal stenosis and RIGHT-sided neural
impingement is L4-5, related to asymmetric loss of interspace
height, bony overgrowth, ventral disc material, and posterior
element hypertrophy. RIGHT L5 neural impingement is evident.

Mild stenosis is also present a at L3-4, related to posterior
element hypertrophy. Only mild dynamic instability, at this level,
with retrolisthesis at this level ranging between 1 mm recumbent for
CT, and 3 mm with patient standing, and no increase between flexion
and extension.

CT LUMBAR MYELOGRAM IMPRESSION:

Central and rightward protrusion at L4-5 is accompanied by
asymmetric disc space narrowing also to the RIGHT, with far-lateral,
foraminal, and extraforaminal spurring. Posterior element
hypertrophy is superimposed. Moderate stenosis with RIGHT greater
than LEFT L4 and L5 neural impingement.

Mild stenosis at L3-4, related to central protrusion, along with
posterior element hypertrophy. Borderline RIGHT L4 neural
impingement.

Osseous narrowing of the RIGHT L5-S1 foramen. Correlate clinically
for possible irritation of the RIGHT L5 nerve root.

LEFT-sided neural impingement at L1-2 and L2-3 as described, related
to disc protrusions, osseous spurring, asymmetric LEFT-sided disc
space narrowing, and facet disease. See discussion above.

Aortic Atherosclerosis (LCORQ-772.2).

## 2019-04-03 DIAGNOSIS — E118 Type 2 diabetes mellitus with unspecified complications: Secondary | ICD-10-CM | POA: Diagnosis not present

## 2019-04-03 DIAGNOSIS — E782 Mixed hyperlipidemia: Secondary | ICD-10-CM | POA: Diagnosis not present

## 2019-04-03 DIAGNOSIS — N189 Chronic kidney disease, unspecified: Secondary | ICD-10-CM | POA: Diagnosis not present

## 2019-04-03 DIAGNOSIS — K219 Gastro-esophageal reflux disease without esophagitis: Secondary | ICD-10-CM | POA: Diagnosis not present

## 2019-04-03 DIAGNOSIS — Z85038 Personal history of other malignant neoplasm of large intestine: Secondary | ICD-10-CM | POA: Diagnosis not present

## 2019-04-03 DIAGNOSIS — I10 Essential (primary) hypertension: Secondary | ICD-10-CM | POA: Diagnosis not present

## 2019-04-03 DIAGNOSIS — M47816 Spondylosis without myelopathy or radiculopathy, lumbar region: Secondary | ICD-10-CM | POA: Diagnosis not present

## 2019-04-07 ENCOUNTER — Other Ambulatory Visit: Payer: Self-pay

## 2019-04-09 ENCOUNTER — Other Ambulatory Visit: Payer: Self-pay

## 2019-04-09 ENCOUNTER — Encounter: Payer: Self-pay | Admitting: Internal Medicine

## 2019-04-09 ENCOUNTER — Ambulatory Visit (INDEPENDENT_AMBULATORY_CARE_PROVIDER_SITE_OTHER): Payer: Medicare Other | Admitting: Internal Medicine

## 2019-04-09 VITALS — BP 150/80 | HR 68 | Ht 62.0 in | Wt 164.0 lb

## 2019-04-09 DIAGNOSIS — E663 Overweight: Secondary | ICD-10-CM

## 2019-04-09 DIAGNOSIS — E1165 Type 2 diabetes mellitus with hyperglycemia: Secondary | ICD-10-CM | POA: Diagnosis not present

## 2019-04-09 DIAGNOSIS — E785 Hyperlipidemia, unspecified: Secondary | ICD-10-CM | POA: Diagnosis not present

## 2019-04-09 LAB — POCT GLYCOSYLATED HEMOGLOBIN (HGB A1C): Hemoglobin A1C: 6.3 % — AB (ref 4.0–5.6)

## 2019-04-09 NOTE — Patient Instructions (Addendum)
Please continue: - Tresiba 12 units in a.m. - Metformin 1000 mg 2x a day, with meals - Glipizide XL 10 mg before b'fast and half of a tablet (5 mg) before dinner  Please return in 6 months with your sugar log.

## 2019-04-09 NOTE — Progress Notes (Signed)
Patient ID: Beth [name], female   DOB: 08-23-1941, 78 y.o.   MRN: [id number]  HPI: Beth [name] is a 78 y.o.-year-old female, returning for f/u for DM2, dx in 1990s, insulin-dependent, uncontrolled, without long-term complications (?  CKD). Last visit 8 months ago.  Last hemoglobin A1c was: Lab Results  Component Value Date   HGBA1C 6.8 (A) 08/18/2018   HGBA1C 7.1 (A) 04/15/2018   HGBA1C 7.1 10/14/2017  01/16/2017: HbA1c 7.4% 01/25/2016: HbA1c 7.0%, glucose 73  Pt is on a regimen of: - Levemir 12 to 15 units at bedtime  >> in a.m. >> Tresiba - Metformin 1000 mg 2x a day, with meals - Glipizide XL 10 mg before b'fast and half of a tablet (5 mg) before dinner  Patient was previously on Januvia and then Victoza. Lipase was elevated, at 74 (0-59) on 07/08/2015, then 69 on 07/18/2015. She was on Invokana 100 mg in a.m. >> yeast infection.  Pt checks her sugars once a day - am:88-131, 147 (steroids) >> 82-119, 130 >> 89-114 - 2h after b'fast:  123, 135 >> n/c >> 171-191, 210 >> n/c - before lunch:  84, 145 (steroids) >> 93-125, 142 >> 101 - 2h after lunch: 169, 181 >> n/c >> 116-197, 208 >> 140-181 - before dinner:  115, 167, 190 (steroids) >> 108-117 >> 90, 111 - 2h after dinner:1 142-187, 212 >> 178-213 >> 172-227 - bedtime: 133-170 >> n/c >> 109-186, 211, 215 >> n/c - nighttime: n/c >> 180, 212 >> n/c Lowest sugar was 66 >> 84 >> 82 >> 89; she has hypoglycemia awareness in the 70s. Highest sugar was 306 >> 190 >> 215 >> 27 (forgot Glipizide).  Glucometer: ReliOn  Pt's meals are: - Breakfast: scrambled eggs, toast or cereals or yoghurt - Lunch: crackers and cheese, carrots, apple - Dinner: meat + salad/veggies + starch - Snacks: 2-3: cashews, cheese and carrots, almost milk + graham crackers  -No CKD, last BUN/creatinine: 04/03/2019: 19/1.01 Lab Results  Component Value Date   BUN 39 (H) 05/02/2018   CREATININE 1.26 (H) 05/02/2018  01/16/2017: BUN/creatinine 21/0.96,  glucose 95 On ramipril. -+ HL; last set of lipids:  04/03/2019: 133/131/40/58 01/16/2017: 141/139/45/69 Lab Results  Component Value Date   CHOL 151 01/28/2015   HDL 37 01/28/2015   LDLCALC 71 01/28/2015   TRIG 216 (A) 01/28/2015  On lovastatin. - last eye exam was 02/2019: No DR, no macular edema.  Dr. Gershon Crane. -no  numbness and tingling in her feet.   She has a history of colon cancer ~2000. She remembers that the mass perforated her colon at that time.  Latest TSH was reviewed per records from PCP: Normal, 3.94 on 04/03/2019.  ROS: Constitutional: + weight gain/no weight loss, no fatigue, no subjective hyperthermia, no subjective hypothermia Eyes: no blurry vision, no xerophthalmia ENT: no sore throat, no nodules palpated in neck, no dysphagia, no odynophagia, no hoarseness Cardiovascular: no CP/no SOB/no palpitations/no leg swelling Respiratory: no cough/no SOB/no wheezing Gastrointestinal: no N/no V/no D/no C/no acid reflux Musculoskeletal: no muscle aches/no joint aches Skin: no rashes, no hair loss Neurological: no tremors/no numbness/no tingling/no dizziness  I reviewed pt's medications, allergies, PMH, social hx, family hx, and changes were documented in the history of present illness. Otherwise, unchanged from my initial visit note.  Past Medical History:  Diagnosis Date  . Arthritis    neck and all over  . Cancer ([place]) 1998   hx colon ca-chemo radiation; lung; denies lung involvement   .  Chronic back pain   . Diabetes mellitus without complication ([place])   . History of Raynaud's syndrome   . Hyperlipemia   . Hypertension   . Psoriasis    afflicts bed of fingernails   . 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 Wears glasses   . Wears partial dentures    top and bottom partials   Past Surgical History:  Procedure Laterality Date  . ABDOMINAL HYSTERECTOMY    . APPENDECTOMY    . CATARACT  EXTRACTION, BILATERAL    . COLONOSCOPY     many  . COMBINED MEDIASTINOSCOPY AND BRONCHOSCOPY  2002   biopsy  . DG ARTHRO [facility]  2011   ablasion rt thumb nailbed  . FORAMINOTOMY 1 LEVEL Bilateral 05/09/2018   Procedure: Foraminotomy L4-L5 Root;  Surgeon: [name], MD;  Location: [facility];  Service: Orthopedics;  Laterality: Bilateral;  . I&D EXTREMITY Right 07/23/2013   Procedure: IRRIGATION AND DEBRIDEMENT DIP JOINT RIGHT INDEX FINGER, EXCISE MUCOID CYST RIGHT INDEX FINGER  ;  Surgeon: Cammie Sickle., MD;  Location: [place];  Service: Orthopedics;  Laterality: Right;  . LIPOMA EXCISION    . LUMBAR LAMINECTOMY/DECOMPRESSION MICRODISCECTOMY N/A 05/09/2018   Procedure: Complete decompressive lumbar laminectomy L4-L5;  Surgeon: [name], MD;  Location: [facility];  Service: Orthopedics;  Laterality: N/A;  134min  . MASS EXCISION Right 09/28/2014   Procedure: EXCISION MASS RIGHT MIDDLE FINGER;  Surgeon: Daryll Brod, MD;  Location: [place];  Service: Orthopedics;  Laterality: Right;  ANESTHESIA:  GENERAL, IV REGIONAL FAB  . MASS EXCISION Bilateral 12/24/2017   Procedure: EXCISION MASS RIGHT SMALL, LEFT INDEX, excision left nail horn middle finger;  Surgeon: Daryll Brod, MD;  Location: [place];  Service: Orthopedics;  Laterality: Bilateral;  FAB  . NAILBED REPAIR Left 08/28/2016   Procedure: Ablation nail matrix left index finger andl left middle finger;  Surgeon: Daryll Brod, MD;  Location: [place];  Service: Orthopedics;  Laterality: Left;  . PARTIAL COLECTOMY  1998   cancer-rad/chemo  . SKIN FULL THICKNESS GRAFT Right 07/23/2013   Procedure: NAIL PLATE ABLATION WITH FULL THICKNESS SKIN GRAFT FROM RIGHT   MEDIAL ELBOW TO RIGHT LONG FINGER NAIL BED;  Surgeon: Cammie Sickle., MD;  Location: [place];  Service: Orthopedics;  Laterality: Right;  . SKIN FULL THICKNESS GRAFT Left 08/28/2016   Procedure:  SKIN GRAFT FULL THICKNESS upper left arm;  Surgeon: Daryll Brod, MD;  Location: [place];  Service: Orthopedics;  Laterality: Left;   Social History   Social History  . Marital Status: Married    Spouse Name: N/A  . Number of Children: 3   Occupational History  . retired   Social History Main Topics  . Smoking status: Former Smoker -- 0.50 packs/day    Types: Cigarettes    Quit date: 07/22/1971  . Smokeless tobacco: Not on file  . Alcohol Use: No     Comment: rare  . Drug Use: No  No Known Allergies Family History  Problem Relation Age of Onset  . Cancer Mother        colon   . Hypertension Father   . CVA Father   . Obesity Brother    Current Outpatient Medications  Medication Sig Dispense Refill  . acetaminophen (TYLENOL) 325 MG tablet Take 325-650 mg by  mouth 2 (two) times daily as needed for moderate pain.     Marland Kitchen aspirin EC 325 MG tablet Take 1 tablet (325 mg total) by mouth daily. 30 tablet 0  . diphenhydramine-acetaminophen (TYLENOL PM) 25-500 MG TABS tablet Take 2 tablets by mouth at bedtime as needed (sleep).     Marland Kitchen glipiZIDE (GLIPIZIDE XL) 10 MG 24 hr tablet TAKE 1 TABLET BY MOUTH TWICE DAILY BEFORE MEAL(S) 180 tablet 3  . glucose blood (ONE TOUCH TEST STRIPS) test strip 1 each by Other route daily. Use as instructed    . hydrochlorothiazide (HYDRODIURIL) 25 MG tablet Take 1 tablet (25 mg total) by mouth daily. 30 tablet 0  . HYDROcodone-acetaminophen (NORCO/VICODIN) 5-325 MG tablet Take 1 tablet by mouth every 4 (four) hours as needed for moderate pain ((score 4 to 6)). 42 tablet 0  . insulin degludec (TRESIBA FLEXTOUCH) 100 UNIT/ML SOPN FlexTouch Pen Inject 12-15 units daily 15 pen 2  . Insulin Pen Needle (DROPLET PEN NEEDLES) 32G X 5 MM MISC Use to inject insulin once a day. 90 each 4  . loratadine (CLARITIN) 10 MG tablet Take 10 mg by mouth daily as needed for allergies.    Marland Kitchen lovastatin (MEVACOR) 10 MG tablet Take 10 mg by mouth every evening.     .  metFORMIN (GLUCOPHAGE) 1000 MG tablet Take 1 tablet (1,000 mg total) by mouth 2 (two) times daily with a meal. 180 tablet 3  . methocarbamol (ROBAXIN) 500 MG tablet Take 1 tablet (500 mg total) by mouth every 6 (six) hours as needed for muscle spasms. 40 tablet 0  . Multiple Vitamin (MULTIVITAMIN) tablet Take 1 tablet by mouth daily.    . ramipril (ALTACE) 10 MG capsule Take 10 mg by mouth daily.    . ranitidine (ZANTAC) 150 MG capsule Take 150 mg by mouth 2 (two) times daily.    . Tetrahydrozoline HCl (VISINE OP) Place 1 drop into both eyes daily as needed (irritation).     No current facility-administered medications for this visit.    PE: BP (!) 180/100   Pulse 68   Ht 5\' 2"  (1.575 m)   Wt 164 lb (74.4 kg)   SpO2 98%   BMI 30.00 kg/m  Body mass index is 29.26 kg/m. Wt Readings from Last 3 Encounters:  04/09/19 164 lb (74.4 kg)  08/18/18 160 lb (72.6 kg)  05/09/18 158 lb (71.7 kg)   Constitutional: overweight, in NAD Eyes: PERRLA, EOMI, no exophthalmos ENT: moist mucous membranes, no thyromegaly, no cervical lymphadenopathy Cardiovascular: RRR, No MRG Respiratory: CTA B Gastrointestinal: abdomen soft, NT, ND, BS+ Musculoskeletal: no deformities, strength intact in all 4 Skin: moist, warm, no rashes Neurological: no tremor with outstretched hands, DTR normal in all 4  ASSESSMENT: 1. DM2, insulin-dependent, uncontrolled, without long complications, but with hyperglycemia  2. HL  3.  Overweight  PLAN:  1. Patient with longstanding, fairly well-controlled type 2 diabetes, on oral antidiabetic regimen and long-acting insulin.  At last visit, she was doing a great job checking her sugars at different times of the day.  They were at goal in the morning but increasing after meals.  I do suspect that she has a degree of insulin deficiency but we were able to manage her with sulfonylurea, without the need for mealtime injections, which the patient prefers.  We did not change her  medication doses at that time but I advised her to move Levemir in the morning to hopefully cover the meals a little better.  Since then, per insurance preference, we changed to [place].  She is using between 12 and 14 units of Tresiba but we discussed that this is a long-acting insulin and does not need to be adjusted if her sugars are higher 1 day. -Otherwise, her sugars are at goal usually during the day but they are higher after meals.  Overall, they appear better than at last visit but she has spikes in the 200s after dinner.  Upon questioning, she may have forgotten to take the glipizide before those meals.  We discussed about the importance of taking the 5 mg of glipizide especially when she is eating out or otherwise having a larger meal. - I suggested to:  Patient Instructions  Please continue: - Tresiba 12 units in a.m. - Metformin 1000 mg 2x a day, with meals - Glipizide XL 10 mg before b'fast and half of a tablet (5 mg) before dinner  Please return in 6 months with your sugar log.   - we checked her HbA1c: 6.3% (better) - advised to check sugars at different times of the day - 1x a day, rotating check times - advised for yearly eye exams >> she is UTD - return to clinic in 6 months    2. HL - Reviewed latest lipid panel from 2018: Excellent LDL -She was supposed to have another lipid panel by PCP 12/2018 but did not have this due to the coronavirus pandemic >> had this last week >> was able to obtain records: all lipid fractions excellent - Continues lovastatin without side effects.  3. Overweight -Weight slightly higher since last visit -Discussed about trying to weigh herself at home.  She is currently not doing so.  Philemon Kingdom, MD PhD [facility]

## 2019-04-09 NOTE — Addendum Note (Signed)
Addended by: Cardell Peach I on: 04/09/2019 11:22 AM   Modules accepted: Orders

## 2019-04-22 IMAGING — DX DG LUMBAR SPINE 2-3V
2 series · 2 of 2 positions shown · non-contrast
Comparison: CT myelogram dated 04/09/2018

CLINICAL DATA: Spinal stenosis.

EXAM:
LUMBAR SPINE - 2-3 VIEW

[l-spine ap]
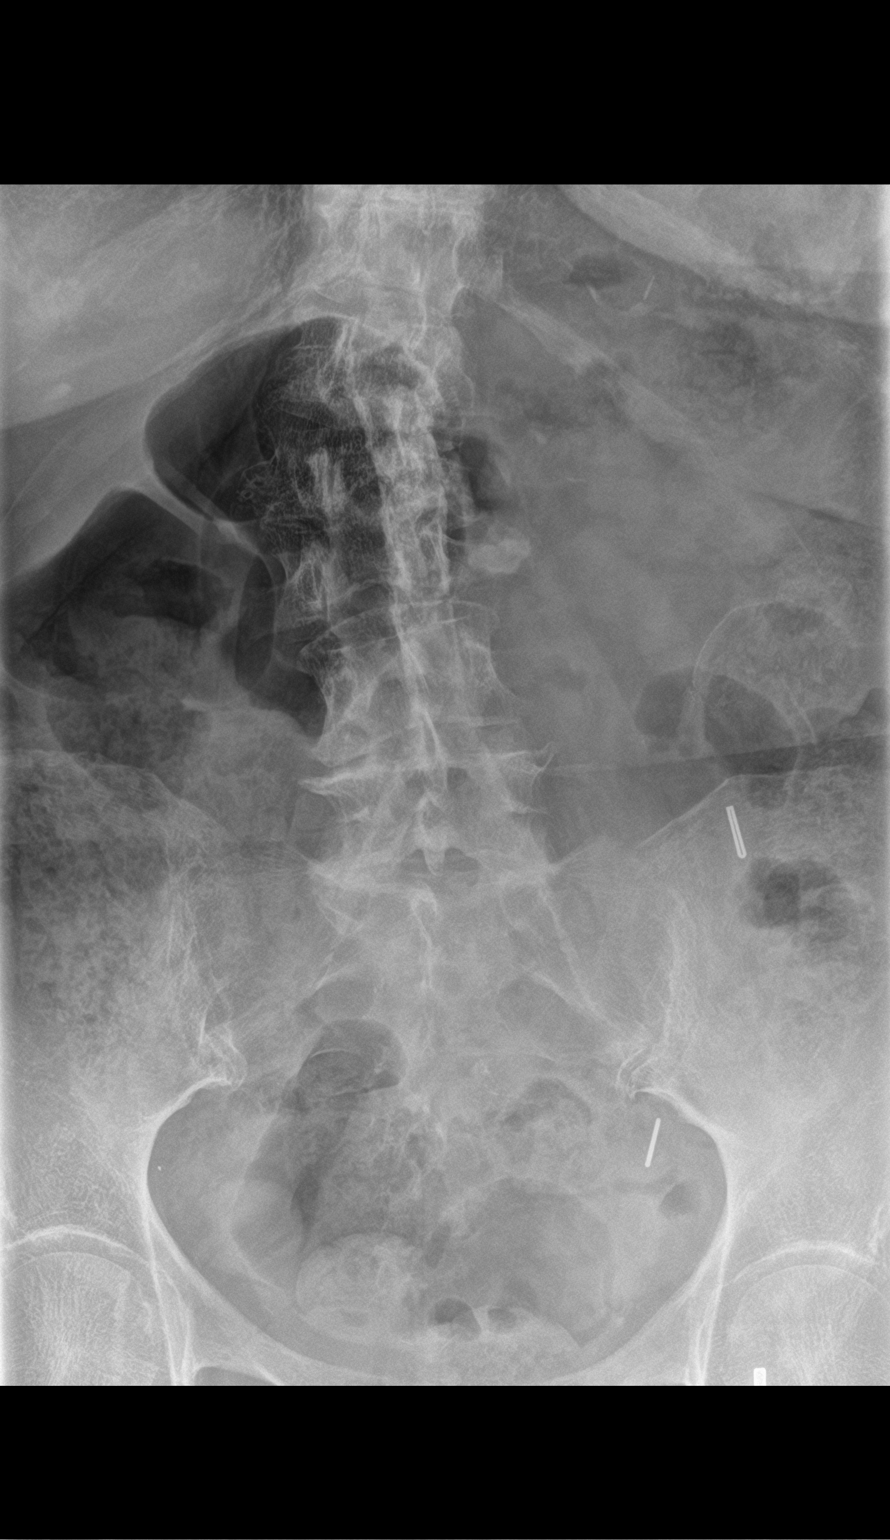

[l-spine lat]
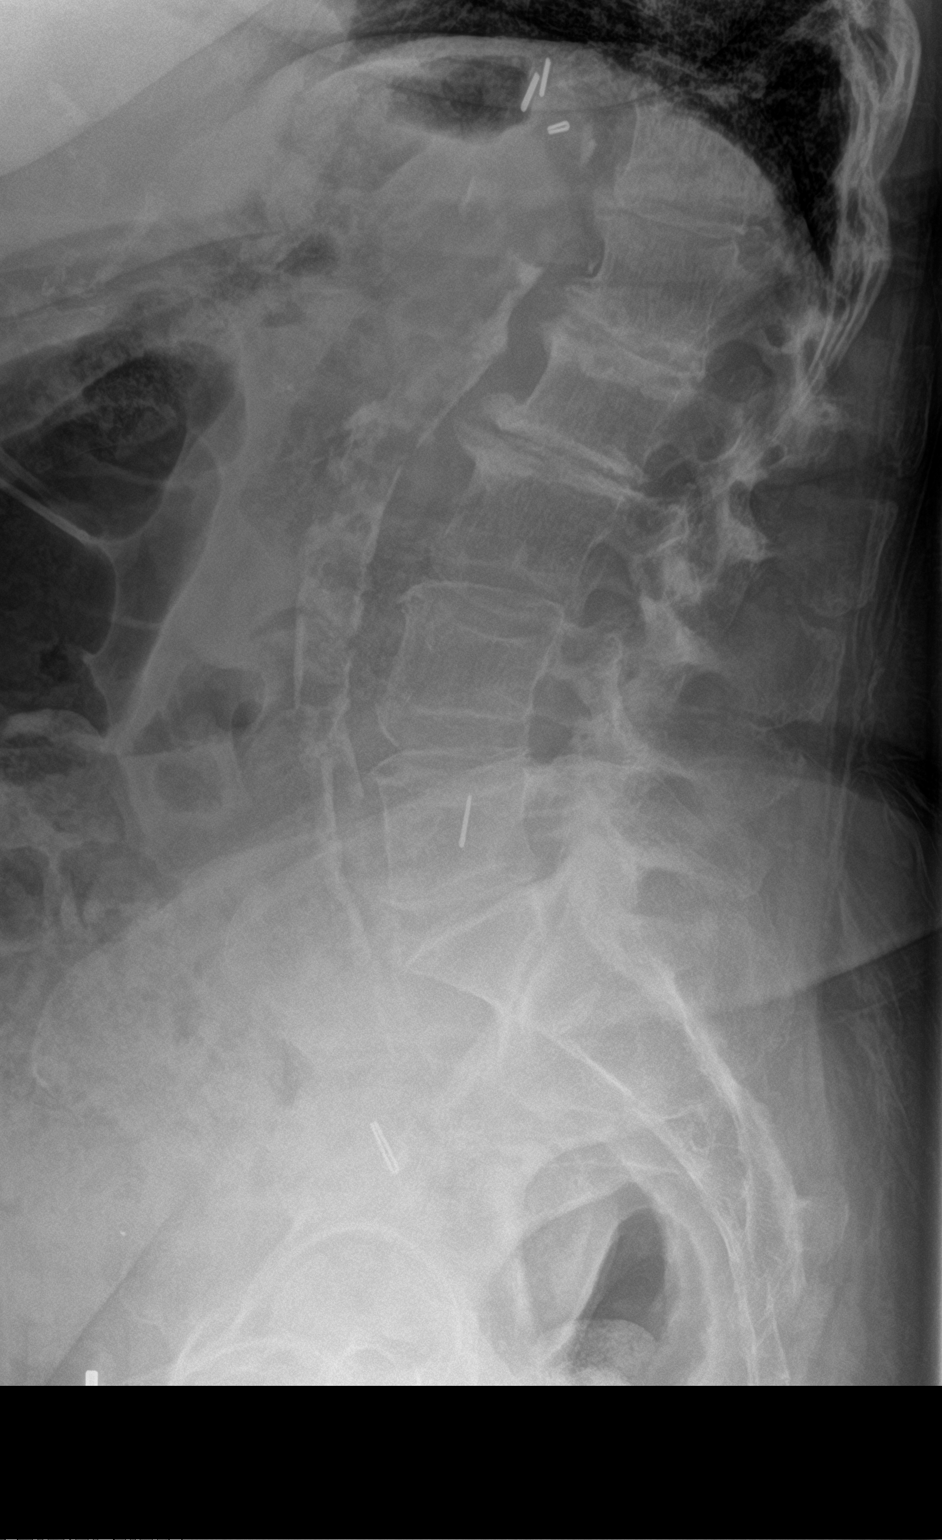

[2 of 2 positions shown; findings below may reference images not displayed]

FINDINGS: Her 5 typical lumbar segments. Mild lumbar scoliosis with convexity
to the right centered at L2. Severe multilevel degenerative disc
disease in the lower thoracic and upper lumbar spine with slightly
less severe degenerative disc disease at L4-5 and L5-S1.

Extensive aortic atherosclerosis.
IMPRESSION: Multilevel degenerative disc disease.

Aortic Atherosclerosis (3YNXY-ZRL.L).

## 2019-06-04 DIAGNOSIS — Z23 Encounter for immunization: Secondary | ICD-10-CM | POA: Diagnosis not present

## 2019-07-01 ENCOUNTER — Ambulatory Visit (INDEPENDENT_AMBULATORY_CARE_PROVIDER_SITE_OTHER): Payer: Medicare Other | Admitting: Podiatry

## 2019-07-01 ENCOUNTER — Other Ambulatory Visit: Payer: Self-pay

## 2019-07-01 ENCOUNTER — Encounter: Payer: Self-pay | Admitting: Podiatry

## 2019-07-01 DIAGNOSIS — L600 Ingrowing nail: Secondary | ICD-10-CM

## 2019-07-01 MED ORDER — NEOMYCIN-POLYMYXIN-HC 3.5-10000-1 OT SOLN: OTIC | 1 refills | Status: DC

## 2019-07-01 NOTE — Patient Instructions (Signed)

## 2019-07-02 NOTE — Progress Notes (Signed)
Subjective:   Patient ID: Beth [name], female   DOB: 78 y.o.   MRN: [id number]   HPI Patient presents stating that she is having a lot of problems with the big toenail on her left foot and also the second nail.  States is the border of the left hallux and the whole second nail and they get sore and she has problems with several other nails but the left are worse and she wants them removed.  Patient states this is been an ongoing problem and she is tried soaking trimming and patient does not smoke likes to be active   Review of Systems  All other systems reviewed and are negative.       Objective:  Physical Exam Vitals signs and nursing note reviewed.  Constitutional:      Appearance: She is well-developed.  Pulmonary:     Effort: Pulmonary effort is normal.  Musculoskeletal: Normal range of motion.  Skin:    General: Skin is warm.  Neurological:     Mental Status: She is alert.     Neurovascular status intact muscle strength adequate range of motion was found to be within normal limits.  Patient is noted to have a thickened dystrophic hallux nail left with incurvation of the lateral border and a thickened painful second nail left that is irritated across the entire bed.  Patient has no redness or active drainage noted and was found to have good digital perfusion and well oriented x3     Assessment:  Ingrown toenail deformity hallux second toes left with incurvated corners and bed of the second nail     Plan:  H&P conditions reviewed and recommended nail removal.  I did explain risk and we discussed her diabetes which is under good condition but this can affect healing and she understands this and signed consent form after review.  Today I infiltrated with 120 mg like Marcaine mixture sterile prep applied to the big toe second toe left and I remove the border of the hallux and the nail second left exposed matrix and applied phenol 3 applications 30 seconds followed by alcohol  lavage and sterile dressing.  Gave instructions on soaks and reappoint and wrote prescription for drops and encouraged her to call with any questions concerns she may half

## 2019-07-15 ENCOUNTER — Other Ambulatory Visit: Payer: Self-pay | Admitting: Internal Medicine

## 2019-08-12 ENCOUNTER — Ambulatory Visit: Payer: Medicare Other | Admitting: Internal Medicine

## 2019-08-17 ENCOUNTER — Other Ambulatory Visit: Payer: Self-pay | Admitting: Family Medicine

## 2019-08-17 ENCOUNTER — Other Ambulatory Visit: Payer: Self-pay

## 2019-08-17 ENCOUNTER — Ambulatory Visit
Admission: RE | Admit: 2019-08-17 | Discharge: 2019-08-17 | Disposition: A | Discharge status: Home / Self Care | Payer: Medicare Other | Source: Ambulatory Visit | Attending: Family Medicine | Admitting: Family Medicine

## 2019-08-17 DIAGNOSIS — Z1231 Encounter for screening mammogram for malignant neoplasm of breast: Secondary | ICD-10-CM

## 2019-08-19 ENCOUNTER — Other Ambulatory Visit: Payer: Self-pay | Admitting: Internal Medicine

## 2019-10-12 ENCOUNTER — Ambulatory Visit: Payer: Medicare Other | Admitting: Internal Medicine

## 2019-12-29 ENCOUNTER — Ambulatory Visit (INDEPENDENT_AMBULATORY_CARE_PROVIDER_SITE_OTHER): Payer: Medicare Other | Admitting: Internal Medicine

## 2019-12-29 ENCOUNTER — Other Ambulatory Visit: Payer: Self-pay

## 2019-12-29 ENCOUNTER — Encounter: Payer: Self-pay | Admitting: Internal Medicine

## 2019-12-29 VITALS — BP 162/70 | HR 68 | Ht 62.0 in | Wt 167.0 lb

## 2019-12-29 DIAGNOSIS — Z794 Long term (current) use of insulin: Secondary | ICD-10-CM | POA: Diagnosis not present

## 2019-12-29 DIAGNOSIS — E669 Obesity, unspecified: Secondary | ICD-10-CM

## 2019-12-29 DIAGNOSIS — N183 Chronic kidney disease, stage 3 unspecified: Secondary | ICD-10-CM

## 2019-12-29 DIAGNOSIS — E785 Hyperlipidemia, unspecified: Secondary | ICD-10-CM | POA: Diagnosis not present

## 2019-12-29 DIAGNOSIS — E1122 Type 2 diabetes mellitus with diabetic chronic kidney disease: Secondary | ICD-10-CM | POA: Diagnosis not present

## 2019-12-29 LAB — POCT GLYCOSYLATED HEMOGLOBIN (HGB A1C): Hemoglobin A1C: 6.5 % — AB (ref 4.0–5.6)

## 2019-12-29 NOTE — Progress Notes (Signed)
Patient ID: Beth [name], female   DOB: 1941/05/09, 79 y.o.   MRN: [id number]  This visit occurred during the SARS-CoV-2 public health emergency.  Safety protocols were in place, including screening questions prior to the visit, additional usage of staff PPE, and extensive cleaning of exam room while observing appropriate contact time as indicated for disinfecting solutions.   HPI: Beth [name] is a 79 y.o.-year-old female, returning for f/u for DM2, dx in 1990s, insulin-dependent, uncontrolled, with long-term complications (CKD). Last visit 9 months ago.  Reviewed HbA1c levels: Lab Results  Component Value Date   HGBA1C 6.3 (A) 04/09/2019   HGBA1C 6.8 (A) 08/18/2018   HGBA1C 7.1 (A) 04/15/2018  01/16/2017: HbA1c 7.4% 01/25/2016: HbA1c 7.0%, glucose 73  Pt is on a regimen of: - Levemir 12 to 15 units at bedtime  >> in a.m. >> Tresiba 12 units daily - Metformin 1000 mg 2x a day, with meals - Glipizide XL 10 mg before b'fast and half of a tablet (5 mg) before dinner  Patient was previously on Januvia and then Victoza. Lipase was elevated, at 74 (0-59) on 07/08/2015, then 69 on 07/18/2015. She was on Invokana 100 mg in a.m. >> yeast infection.  Pt checks her sugars once a day: - am: 82-119, 130 >> 89-114 >> 88-112, 129, 130 - 2h after b'fast:  123, 135 >> n/c >> 171-191, 210 >> n/c - before lunch:  84, 145 (steroids) >> 93-125, 142 >> 101 >> n/c - 2h after lunch: 169, 181 >> n/c >> 116-197, 208 >> 140-181 >> n/c - before dinner:  115, 167, 190 (steroids) >> 108-117 >> 90, 111 >> n/c - 2h after dinner:1 142-187, 212 >> 178-213 >> 172-227 >> n/c - bedtime: 133-170 >> n/c >> 109-186, 211, 215 >> n/c - nighttime: n/c >> 180, 212 >> n/c Lowest sugar was 66 ...>> 15; she has hypoglycemia awareness in the 48s. Highest sugar was 306 >> ...130.  Glucometer: ReliOn  Pt's meals are: - Breakfast: scrambled eggs, toast or cereals or yoghurt - Lunch: crackers and cheese, carrots, apple -  Dinner: meat + salad/veggies + starch - Snacks: 2-3: cashews, cheese and carrots, almost milk + graham crackers  -+ Mild CKD, last BUN/creatinine: 04/03/2019: 19/1.01 Lab Results  Component Value Date   BUN 39 (H) 05/02/2018   CREATININE 1.26 (H) 05/02/2018  01/16/2017: BUN/creatinine 21/0.96, glucose 95 On ramipril. -+ HL; last set of lipids:  04/03/2019: 133/131/40/58 01/16/2017: 141/139/45/69 Lab Results  Component Value Date   CHOL 151 01/28/2015   HDL 37 01/28/2015   LDLCALC 71 01/28/2015   TRIG 216 (A) 01/28/2015  On lovastatin n. - last eye exam was 02/2019: No DR, no macular edema Dr. Gershon Crane. -She denies numbness and tingling in her feet.   She has a history of colon cancer ~2000. She remembers that the mass perforated her colon at that time.  Latest TSH was reviewed per records from PCP: Normal, 3.94 on 04/03/2019.  ROS: Constitutional: + weight gain/no weight loss, no fatigue, no subjective hyperthermia, no subjective hypothermia Eyes: no blurry vision, no xerophthalmia ENT: no sore throat, no nodules palpated in neck, no dysphagia, no odynophagia, no hoarseness Cardiovascular: no CP/no SOB/no palpitations/no leg swelling Respiratory: no cough/no SOB/no wheezing Gastrointestinal: no N/no V/no D/no C/no acid reflux Musculoskeletal: no muscle aches/no joint aches Skin: no rashes, no hair loss Neurological: no tremors/no numbness/no tingling/no dizziness  I reviewed pt's medications, allergies, PMH, social hx, family hx, and changes were documented in the  history of present illness. Otherwise, unchanged from my initial visit note.  Past Medical History:  Diagnosis Date  . Arthritis    neck and all over  . Cancer ([place]) 1998   hx colon ca-chemo radiation; lung; denies lung involvement   . Chronic back pain   . Diabetes mellitus without complication ([place])   . History of Raynaud's syndrome   . Hyperlipemia   . Hypertension   . Psoriasis    afflicts bed of  fingernails   . 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 Wears glasses   . Wears partial dentures    top and bottom partials   Past Surgical History:  Procedure Laterality Date  . ABDOMINAL HYSTERECTOMY    . APPENDECTOMY    . CATARACT EXTRACTION, BILATERAL    . COLONOSCOPY     many  . COMBINED MEDIASTINOSCOPY AND BRONCHOSCOPY  2002   biopsy  . DG ARTHRO [facility]*  2011   ablasion rt thumb nailbed  . FORAMINOTOMY 1 LEVEL Bilateral 05/09/2018   Procedure: Foraminotomy L4-L5 Root;  Surgeon: [name], MD;  Location: [facility];  Service: Orthopedics;  Laterality: Bilateral;  . I & D EXTREMITY Right 07/23/2013   Procedure: IRRIGATION AND DEBRIDEMENT DIP JOINT RIGHT INDEX FINGER, EXCISE MUCOID CYST RIGHT INDEX FINGER  ;  Surgeon: Cammie Sickle., MD;  Location: [place];  Service: Orthopedics;  Laterality: Right;  . LIPOMA EXCISION    . LUMBAR LAMINECTOMY/DECOMPRESSION MICRODISCECTOMY N/A 05/09/2018   Procedure: Complete decompressive lumbar laminectomy L4-L5;  Surgeon: [name], MD;  [facility];  Service: Orthopedics;  Laterality: N/A;  126min  . MASS EXCISION Right 09/28/2014   Procedure: EXCISION MASS RIGHT MIDDLE FINGER;  Surgeon: Daryll Brod, MD;  Location: [place];  Service: Orthopedics;  Laterality: Right;  ANESTHESIA:  GENERAL, IV REGIONAL FAB  . MASS EXCISION Bilateral 12/24/2017   Procedure: EXCISION MASS RIGHT SMALL, LEFT INDEX, excision left nail horn middle finger;  Surgeon: Daryll Brod, MD;  Location: [place];  Service: Orthopedics;  Laterality: Bilateral;  FAB  . NAILBED REPAIR Left 08/28/2016   Procedure: Ablation nail matrix left index finger andl left middle finger;  Surgeon: Daryll Brod, MD;  Location: [place];  Service: Orthopedics;  Laterality: Left;  . PARTIAL COLECTOMY  1998   cancer-rad/chemo  . SKIN FULL THICKNESS GRAFT Right  07/23/2013   Procedure: NAIL PLATE ABLATION WITH FULL THICKNESS SKIN GRAFT FROM RIGHT   MEDIAL ELBOW TO RIGHT LONG FINGER NAIL BED;  Surgeon: Cammie Sickle., MD;  Location: [place];  Service: Orthopedics;  Laterality: Right;  . SKIN FULL THICKNESS GRAFT Left 08/28/2016   Procedure: SKIN GRAFT FULL THICKNESS upper left arm;  Surgeon: Daryll Brod, MD;  Location: [place];  Service: Orthopedics;  Laterality: Left;   Social History   Social History  . Marital Status: Married    Spouse Name: N/A  . Number of Children: 3   Occupational History  . retired   Social History Main Topics  . Smoking status: Former Smoker -- 0.50 packs/day    Types: Cigarettes    Quit date: 07/22/1971  . Smokeless tobacco: Not on file  . Alcohol Use: No     Comment: rare  . Drug Use: No  No Known Allergies Family History  Problem Relation Age of Onset  .  Cancer Mother        colon   . Hypertension Father   . CVA Father   . Obesity Brother    Current Outpatient Medications  Medication Sig Dispense Refill  . acetaminophen (TYLENOL) 325 MG tablet Take 325-650 mg by mouth 2 (two) times daily as needed for moderate pain.     Marland Kitchen aspirin EC 325 MG tablet Take 1 tablet (325 mg total) by mouth daily. 30 tablet 0  . diphenhydramine-acetaminophen (TYLENOL PM) 25-500 MG TABS tablet Take 2 tablets by mouth at bedtime as needed (sleep).     . DROPLET PEN NEEDLES 32G X 5 MM MISC USE  TO  INJECT  INSULIN ONE TIME DAILY AS DIRECTED 100 each 11  . glipiZIDE (GLUCOTROL XL) 10 MG 24 hr tablet TAKE 1 TABLET TWICE DAILY BEFORE MEALS 180 tablet 3  . glucose blood (ONE TOUCH TEST STRIPS) test strip 1 each by Other route daily. Use as instructed    . hydrochlorothiazide (HYDRODIURIL) 25 MG tablet Take 1 tablet (25 mg total) by mouth daily. 30 tablet 0  . insulin degludec (TRESIBA FLEXTOUCH) 100 UNIT/ML SOPN FlexTouch Pen Inject 12-15 units daily 15 pen 2  . loratadine (CLARITIN) 10 MG tablet  Take 10 mg by mouth daily as needed for allergies.    Marland Kitchen lovastatin (MEVACOR) 10 MG tablet Take 10 mg by mouth every evening.     . metFORMIN (GLUCOPHAGE) 1000 MG tablet TAKE 1 TABLET (1,000 MG TOTAL) BY MOUTH 2 (TWO) TIMES DAILY WITH A MEAL. 180 tablet 3  . methocarbamol (ROBAXIN) 500 MG tablet Take 1 tablet (500 mg total) by mouth every 6 (six) hours as needed for muscle spasms. 40 tablet 0  . Multiple Vitamin (MULTIVITAMIN) tablet Take 1 tablet by mouth daily.    Marland Kitchen neomycin-polymyxin-hydrocortisone (CORTISPORIN) OTIC solution Apply 1-2 drops to toe BID after soaking 10 mL 1  . ramipril (ALTACE) 10 MG capsule Take 10 mg by mouth daily.    . ranitidine (ZANTAC) 150 MG capsule Take 150 mg by mouth 2 (two) times daily.    . Tetrahydrozoline HCl (VISINE OP) Place 1 drop into both eyes daily as needed (irritation).     No current facility-administered medications for this visit.   PE: BP (!) 162/70   Pulse 68   Ht 5\' 2"  (1.575 m)   Wt 167 lb (75.8 kg)   SpO2 97%   BMI 30.54 kg/m  Body mass index is 30.54 kg/m. Wt Readings from Last 3 Encounters:  12/29/19 167 lb (75.8 kg)  04/09/19 164 lb (74.4 kg)  08/18/18 160 lb (72.6 kg)   Constitutional: overweight, in NAD Eyes: PERRLA, EOMI, no exophthalmos ENT: moist mucous membranes, no thyromegaly, no cervical lymphadenopathy Cardiovascular: RRR, No MRG Respiratory: CTA B Gastrointestinal: abdomen soft, NT, ND, BS+ Musculoskeletal: no deformities, strength intact in all 4 Skin: moist, warm, no rashes Neurological: no tremor with outstretched hands, DTR normal in all 4  ASSESSMENT: 1. DM2, insulin-dependent, uncontrolled, with complications - CKD  2. HL  3.   obesity class I  PLAN:  1. Patient with longstanding, fairly well-controlled type 2 diabetes, on oral antidiabetic regimen with Metformin and sulfonylurea and also long-acting insulin, with good control.  In the past, her sugars are at goal in the morning but increasing after  meals.  Since last visit, however, she only checked sugars in the morning and these are at goal.  I strongly advised her to start checking at different times of the day  and given her a sugar log. - we checked her HbA1c: 6.5% (slightly higher).  Therefore, I do not suspect that her sugars increase significantly above target during the day so we can continue the current regimen. - She does not complain of any hypoglycemia episodes - I suggested to:  Patient Instructions  Please continue: - Tresiba 12 units in a.m. - Metformin 1000 mg 2x a day, with meals - Glipizide XL 10 mg before b'fast and half of a tablet (5 mg) before dinner  Please check some sugars later in the day.  Please return in 6 months with your sugar log.   - advised to check sugars at different times of the day - 1x a day, rotating check times - advised for yearly eye exams >> she is UTD - return to clinic in 6 months  2. HL - Reviewed latest lipid panel from 2018: Excellent LDL and she had another lipid panel before our last visit that this was excellent. - Continues lovastatin without side effects.  3.  Obesity class I -She gained 3 pounds since last visit -Continue Metformin which is a mild appetite suppressant effect  Philemon Kingdom, MD PhD [facility] Endocrinology

## 2019-12-29 NOTE — Patient Instructions (Addendum)
Please continue: - Tresiba 12 units in a.m. - Metformin 1000 mg 2x a day, with meals - Glipizide XL 10 mg before b'fast and half of a tablet (5 mg) before dinner  Please check some sugars later in the day.  Please return in 6 months with your sugar log.

## 2019-12-29 NOTE — Addendum Note (Signed)
Addended by: Cardell Peach I on: 12/29/2019 10:58 AM   Modules accepted: Orders

## 2020-03-03 DIAGNOSIS — Z961 Presence of intraocular lens: Secondary | ICD-10-CM | POA: Diagnosis not present

## 2020-03-03 DIAGNOSIS — H40013 Open angle with borderline findings, low risk, bilateral: Secondary | ICD-10-CM | POA: Diagnosis not present

## 2020-03-03 DIAGNOSIS — Z794 Long term (current) use of insulin: Secondary | ICD-10-CM | POA: Diagnosis not present

## 2020-03-03 DIAGNOSIS — E119 Type 2 diabetes mellitus without complications: Secondary | ICD-10-CM | POA: Diagnosis not present

## 2020-03-05 ENCOUNTER — Other Ambulatory Visit: Payer: Self-pay | Admitting: Internal Medicine

## 2020-03-31 DIAGNOSIS — Z23 Encounter for immunization: Secondary | ICD-10-CM | POA: Diagnosis not present

## 2020-04-07 DIAGNOSIS — E782 Mixed hyperlipidemia: Secondary | ICD-10-CM | POA: Diagnosis not present

## 2020-04-07 DIAGNOSIS — N189 Chronic kidney disease, unspecified: Secondary | ICD-10-CM | POA: Diagnosis not present

## 2020-04-07 DIAGNOSIS — R899 Unspecified abnormal finding in specimens from other organs, systems and tissues: Secondary | ICD-10-CM | POA: Diagnosis not present

## 2020-04-07 DIAGNOSIS — Z0000 Encounter for general adult medical examination without abnormal findings: Secondary | ICD-10-CM | POA: Diagnosis not present

## 2020-04-07 DIAGNOSIS — I10 Essential (primary) hypertension: Secondary | ICD-10-CM | POA: Diagnosis not present

## 2020-04-07 DIAGNOSIS — Z794 Long term (current) use of insulin: Secondary | ICD-10-CM | POA: Diagnosis not present

## 2020-04-07 DIAGNOSIS — E118 Type 2 diabetes mellitus with unspecified complications: Secondary | ICD-10-CM | POA: Diagnosis not present

## 2020-04-07 DIAGNOSIS — D649 Anemia, unspecified: Secondary | ICD-10-CM | POA: Diagnosis not present

## 2020-04-07 DIAGNOSIS — Z85038 Personal history of other malignant neoplasm of large intestine: Secondary | ICD-10-CM | POA: Diagnosis not present

## 2020-04-07 DIAGNOSIS — R8290 Unspecified abnormal findings in urine: Secondary | ICD-10-CM | POA: Diagnosis not present

## 2020-04-07 DIAGNOSIS — M85839 Other specified disorders of bone density and structure, unspecified forearm: Secondary | ICD-10-CM | POA: Diagnosis not present

## 2020-04-08 ENCOUNTER — Other Ambulatory Visit: Payer: Self-pay | Admitting: Family Medicine

## 2020-04-08 DIAGNOSIS — E2839 Other primary ovarian failure: Secondary | ICD-10-CM

## 2020-04-14 DIAGNOSIS — M25512 Pain in left shoulder: Secondary | ICD-10-CM | POA: Diagnosis not present

## 2020-04-28 DIAGNOSIS — Z23 Encounter for immunization: Secondary | ICD-10-CM | POA: Diagnosis not present

## 2020-05-30 DIAGNOSIS — Z23 Encounter for immunization: Secondary | ICD-10-CM | POA: Diagnosis not present

## 2020-06-25 ENCOUNTER — Other Ambulatory Visit: Payer: Self-pay | Admitting: Internal Medicine

## 2020-07-04 ENCOUNTER — Ambulatory Visit: Payer: Medicare Other | Admitting: Internal Medicine

## 2020-07-05 ENCOUNTER — Other Ambulatory Visit: Payer: Medicare Other

## 2020-08-01 ENCOUNTER — Other Ambulatory Visit: Payer: Self-pay | Admitting: Family Medicine

## 2020-08-01 DIAGNOSIS — E2839 Other primary ovarian failure: Secondary | ICD-10-CM

## 2020-09-19 ENCOUNTER — Other Ambulatory Visit: Payer: Self-pay | Admitting: Internal Medicine

## 2020-11-13 ENCOUNTER — Other Ambulatory Visit: Payer: Self-pay | Admitting: Internal Medicine

## 2020-12-21 ENCOUNTER — Other Ambulatory Visit: Payer: Self-pay | Admitting: Internal Medicine

## 2021-01-26 ENCOUNTER — Other Ambulatory Visit: Payer: Self-pay | Admitting: Internal Medicine

## 2021-01-31 ENCOUNTER — Encounter: Payer: Self-pay | Admitting: Internal Medicine

## 2021-01-31 ENCOUNTER — Ambulatory Visit (INDEPENDENT_AMBULATORY_CARE_PROVIDER_SITE_OTHER): Payer: Medicare Other | Admitting: Internal Medicine

## 2021-01-31 ENCOUNTER — Other Ambulatory Visit: Payer: Self-pay

## 2021-01-31 VITALS — BP 140/90 | HR 93 | Ht 62.0 in | Wt 156.6 lb

## 2021-01-31 DIAGNOSIS — E785 Hyperlipidemia, unspecified: Secondary | ICD-10-CM

## 2021-01-31 DIAGNOSIS — E1122 Type 2 diabetes mellitus with diabetic chronic kidney disease: Secondary | ICD-10-CM

## 2021-01-31 DIAGNOSIS — N183 Chronic kidney disease, stage 3 unspecified: Secondary | ICD-10-CM

## 2021-01-31 DIAGNOSIS — E669 Obesity, unspecified: Secondary | ICD-10-CM | POA: Diagnosis not present

## 2021-01-31 DIAGNOSIS — Z794 Long term (current) use of insulin: Secondary | ICD-10-CM

## 2021-01-31 LAB — POCT GLYCOSYLATED HEMOGLOBIN (HGB A1C): Hemoglobin A1C: 7.2 % — AB (ref 4.0–5.6)

## 2021-01-31 MED ORDER — TRESIBA FLEXTOUCH 100 UNIT/ML ~~LOC~~ SOPN: PEN_INJECTOR | SUBCUTANEOUS | 0 refills | Status: DC

## 2021-01-31 MED ORDER — GLIPIZIDE ER 10 MG PO TB24: ORAL_TABLET | ORAL | 3 refills | Status: DC

## 2021-01-31 MED ORDER — METFORMIN HCL 1000 MG PO TABS: ORAL_TABLET | ORAL | 3 refills | Status: DC

## 2021-01-31 MED ORDER — TRESIBA FLEXTOUCH 100 UNIT/ML ~~LOC~~ SOPN: PEN_INJECTOR | SUBCUTANEOUS | 3 refills | Status: DC

## 2021-01-31 NOTE — Patient Instructions (Addendum)
Please continue: - Metformin 1000 mg 2x a day with meals - Tresiba 15 units daily  Please decrease: - Glipizide ER to 10 mg in am and 5 mg before dinner  Please return in 6 months with your sugar log.

## 2021-01-31 NOTE — Progress Notes (Signed)
Patient ID: Beth [name], female   DOB: 10/22/1940, 80 y.o.   MRN: [id number]  This visit occurred during the SARS-CoV-2 public health emergency.  Safety protocols were in place, including screening questions prior to the visit, additional usage of staff PPE, and extensive cleaning of exam room while observing appropriate contact time as indicated for disinfecting solutions.   HPI: Beth [name] is a 80 y.o.-year-old pleasant female, returning for f/u for DM2, dx in 1990s, insulin-dependent, uncontrolled, with long-term complications (CKD). Last visit 1 year and 1 month ago.  Interim history: She had teeth pulled 09/2020 >> still does not have the new teeth >> she cannot eat apples (which she loves) and other hard foods She lost 11 pounds since last visit. Otherwise, she is feeling well, without complaints.  Reviewed HbA1c levels: Lab Results  Component Value Date   HGBA1C 6.5 (A) 12/29/2019   HGBA1C 6.3 (A) 04/09/2019   HGBA1C 6.8 (A) 08/18/2018  01/16/2017: HbA1c 7.4% 01/25/2016: HbA1c 7.0%, glucose 73  Pt is on a regimen of: - Metformin 1000 mg 2x a day, with meals - Glipizide XL 10 mg before b'fast and half of a tablet (5 mg) >> actually takes 10 mg before dinner  - Levemir 12 to 15 units at bedtime  >> in a.m. >> Tresiba 12  >> actually takes 15 units daily  Patient was previously on Januvia and then Victoza. Lipase was elevated, at 74 (0-59) on 07/08/2015, then 69 on 07/18/2015. She was on Invokana 100 mg in a.m. >> yeast infection.  Pt checks her sugars once a day: - am: 82-119, 130 >> 89-114 >> 88-112, 129, 130 >> 85-110 - 2h after b'fast:  123, 135 >> n/c >> 171-191, 210 >> n/c - before lunch:  84, 145 >> 93-125, 142 >> 101 >> n/c - 2h after lunch:  n/c >> 116-197, 208 >> 140-181 >> n/c - before dinner: 115, 167, 190  >> 108-117 >> 90, 111 >> n/c - 2h after dinner:1 142-187, 212 >> 178-213 >> 172-227 >> n/c - bedtime: 133-170 >> n/c >> 109-186, 211, 215 >> n/c >>  127-130 - nighttime: n/c >> 180, 212 >> n/c Lowest sugar was 66 ...>> 88 >> 70-85; she has hypoglycemia awareness in the 85. Highest sugar was 306 >> ...130 >> 170 (dietart indiscretions).  Glucometer: ReliOn  -+ Mild CKD, last BUN/creatinine: 04/03/2019: 19/1.01 Lab Results  Component Value Date   BUN 39 (H) 05/02/2018   CREATININE 1.26 (H) 05/02/2018  01/16/2017: BUN/creatinine 21/0.96, glucose 95 On ramipril. -+ HL; last set of lipids:  04/03/2019: 133/131/40/58 01/16/2017: 141/139/45/69 Lab Results  Component Value Date   CHOL 151 01/28/2015   HDL 37 01/28/2015   LDLCALC 71 01/28/2015   TRIG 216 (A) 01/28/2015  On lovastatin n. - last eye exam was 03/03/2020: No DR, no macular edema Dr. Gershon Crane. -She denies numbness and tingling in her feet.   She has a history of colon cancer ~2000. She remembers that the mass perforated her colon at that time.  Latest TSH was reviewed per records from PCP: Normal, 3.94 on 04/03/2019.  ROS: Constitutional: no weight gain/no weight loss, no fatigue, no subjective hyperthermia, no subjective hypothermia Eyes: no blurry vision, no xerophthalmia ENT: no sore throat, no nodules palpated in neck, no dysphagia, no odynophagia, no hoarseness Cardiovascular: no CP/no SOB/no palpitations/no leg swelling Respiratory: no cough/no SOB/no wheezing Gastrointestinal: no N/no V/no D/no C/no acid reflux Musculoskeletal: no muscle aches/no joint aches Skin: no rashes, no hair  loss Neurological: no tremors/no numbness/no tingling/no dizziness  I reviewed pt's medications, allergies, PMH, social hx, family hx, and changes were documented in the history of present illness. Otherwise, unchanged from my initial visit note.  Past Medical History:  Diagnosis Date  . Arthritis    neck and all over  . Cancer ([place]) 1998   hx colon ca-chemo radiation; lung; denies lung involvement   . Chronic back pain   . Diabetes mellitus without complication ([place])   .  History of Raynaud's syndrome   . Hyperlipemia   . Hypertension   . Psoriasis    afflicts bed of fingernails   . 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 Wears glasses   . Wears partial dentures    top and bottom partials   Past Surgical History:  Procedure Laterality Date  . ABDOMINAL HYSTERECTOMY    . APPENDECTOMY    . CATARACT EXTRACTION, BILATERAL    . COLONOSCOPY     many  . COMBINED MEDIASTINOSCOPY AND BRONCHOSCOPY  2002   biopsy  . DG ARTHRO [facility]*  2011   ablasion rt thumb nailbed  . FORAMINOTOMY 1 LEVEL Bilateral 05/09/2018   Procedure: Foraminotomy L4-L5 Root;  Surgeon: [name], MD;  [facility];  Service: Orthopedics;  Laterality: Bilateral;  . I & D EXTREMITY Right 07/23/2013   Procedure: IRRIGATION AND DEBRIDEMENT DIP JOINT RIGHT INDEX FINGER, EXCISE MUCOID CYST RIGHT INDEX FINGER  ;  Surgeon: Cammie Sickle., MD;  Location: [place];  Service: Orthopedics;  Laterality: Right;  . LIPOMA EXCISION    . LUMBAR LAMINECTOMY/DECOMPRESSION MICRODISCECTOMY N/A 05/09/2018   Procedure: Complete decompressive lumbar laminectomy L4-L5;  Surgeon: [name], MD;  [facility];  Service: Orthopedics;  Laterality: N/A;  192min  . MASS EXCISION Right 09/28/2014   Procedure: EXCISION MASS RIGHT MIDDLE FINGER;  Surgeon: Daryll Brod, MD;  Location: [place];  Service: Orthopedics;  Laterality: Right;  ANESTHESIA:  GENERAL, IV REGIONAL FAB  . MASS EXCISION Bilateral 12/24/2017   Procedure: EXCISION MASS RIGHT SMALL, LEFT INDEX, excision left nail horn middle finger;  Surgeon: Daryll Brod, MD;  Location: [place];  Service: Orthopedics;  Laterality: Bilateral;  FAB  . NAILBED REPAIR Left 08/28/2016   Procedure: Ablation nail matrix left index finger andl left middle finger;  Surgeon: Daryll Brod, MD;  Location: [place];  Service: Orthopedics;   Laterality: Left;  . PARTIAL COLECTOMY  1998   cancer-rad/chemo  . SKIN FULL THICKNESS GRAFT Right 07/23/2013   Procedure: NAIL PLATE ABLATION WITH FULL THICKNESS SKIN GRAFT FROM RIGHT   MEDIAL ELBOW TO RIGHT LONG FINGER NAIL BED;  Surgeon: Cammie Sickle., MD;  Location: [place];  Service: Orthopedics;  Laterality: Right;  . SKIN FULL THICKNESS GRAFT Left 08/28/2016   Procedure: SKIN GRAFT FULL THICKNESS upper left arm;  Surgeon: Daryll Brod, MD;  Location: [place];  Service: Orthopedics;  Laterality: Left;   Social History   Social History  . Marital Status: Married    Spouse Name: N/A  . Number of Children: 3   Occupational History  . retired   Social History Main Topics  . Smoking status: Former Smoker -- 0.50 packs/day    Types: Cigarettes    Quit date: 07/22/1971  . Smokeless tobacco: Not on file  . Alcohol Use: No  Comment: rare  . Drug Use: No  No Known Allergies Family History  Problem Relation Age of Onset  . Cancer Mother        colon   . Hypertension Father   . CVA Father   . Obesity Brother    Current Outpatient Medications  Medication Sig Dispense Refill  . acetaminophen (TYLENOL) 325 MG tablet Take 325-650 mg by mouth 2 (two) times daily as needed for moderate pain.     Marland Kitchen aspirin EC 325 MG tablet Take 1 tablet (325 mg total) by mouth daily. 30 tablet 0  . diphenhydramine-acetaminophen (TYLENOL PM) 25-500 MG TABS tablet Take 2 tablets by mouth at bedtime as needed (sleep).     . DROPLET PEN NEEDLES 32G X 5 MM MISC USE TO INJECT INSULIN ONE TIME DAILY AS DIRECTED 50 each 0  . glipiZIDE (GLUCOTROL XL) 10 MG 24 hr tablet TAKE 1 TABLET TWICE DAILY BEFORE MEALS (NEED MD APPOINTMENT FOR REFILLS) 60 tablet 0  . glucose blood (ONE TOUCH TEST STRIPS) test strip 1 each by Other route daily. Use as instructed    . hydrochlorothiazide (HYDRODIURIL) 25 MG tablet Take 1 tablet (25 mg total) by mouth daily. 30 tablet 0  . loratadine  (CLARITIN) 10 MG tablet Take 10 mg by mouth daily as needed for allergies.    Marland Kitchen lovastatin (MEVACOR) 10 MG tablet Take 10 mg by mouth every evening.     . metFORMIN (GLUCOPHAGE) 1000 MG tablet TAKE 1 TABLET TWICE DAILY WITH MEALS (NEED MD APPOINTMENT FOR REFILLS) 60 tablet 0  . methocarbamol (ROBAXIN) 500 MG tablet Take 1 tablet (500 mg total) by mouth every 6 (six) hours as needed for muscle spasms. 40 tablet 0  . Multiple Vitamin (MULTIVITAMIN) tablet Take 1 tablet by mouth daily.    Marland Kitchen neomycin-polymyxin-hydrocortisone (CORTISPORIN) OTIC solution Apply 1-2 drops to toe BID after soaking 10 mL 1  . ramipril (ALTACE) 10 MG capsule Take 10 mg by mouth daily.    . ranitidine (ZANTAC) 150 MG capsule Take 150 mg by mouth 2 (two) times daily.    . Tetrahydrozoline HCl (VISINE OP) Place 1 drop into both eyes daily as needed (irritation).    . TRESIBA FLEXTOUCH 100 UNIT/ML FlexTouch Pen INJECT 12 TO 15 UNITS SUBCUTANEOUSLY DAILY 15 mL 0   No current facility-administered medications for this visit.   PE: BP 140/90 (BP Location: Right Arm, Patient Position: Sitting, Cuff Size: Normal)   Pulse 93   Ht 5\' 2"  (1.575 m)   Wt 156 lb 9.6 oz (71 kg)   SpO2 97%   BMI 28.64 kg/m  Body mass index is 28.64 kg/m. Wt Readings from Last 3 Encounters:  01/31/21 156 lb 9.6 oz (71 kg)  12/29/19 167 lb (75.8 kg)  04/09/19 164 lb (74.4 kg)   Constitutional: overweight, in NAD Eyes: PERRLA, EOMI, no exophthalmos ENT: moist mucous membranes, no thyromegaly, no cervical lymphadenopathy Cardiovascular: RRR, No MRG Respiratory: CTA B Gastrointestinal: abdomen soft, NT, ND, BS+ Musculoskeletal: no deformities, strength intact in all 4 Skin: moist, warm, no rashes Neurological: no tremor with outstretched hands, DTR normal in all 4  ASSESSMENT: 1. DM2, insulin-dependent, uncontrolled, with complications - CKD  2. HL  3. Obesity class I  PLAN:  1. Patient with longstanding, fairly well-controlled type 2  diabetes, on oral antidiabetic regimen with metformin and sulfonylurea and also long-acting insulin, with an HbA1c of 6.5% at last visit.  This was slightly higher compared to the previous, but she  was only checking sugars in the morning and these were at goal so we could not change her regimen at that time.  She denied any hypoglycemic episodes.  I did strongly advise her to start checking sugars at different times of the day, rotating check times. -At this visit, sugars are excellent, and she does not have significant lows despite taking more glipizide and insulin than prescribed at last visit.  She was confused with a dose of Tyler Aas that she has to take daily but she believes she is taking 15 units.  States that sugars in the morning are at goal, we will continue this.  However, she increase the dose of glipizide ER to 10 mg before dinner, rather than 5 mg as prescribed at last visit and we discussed about the importance of avoiding low blood sugars overnight.  We will decrease the glipizide dose to 5 mg before dinner.  We will continue the same dose of metformin.  I refilled the metformin and glipizide for her. - I suggested to:  Patient Instructions  Please continue: - Metformin 1000 mg 2x a day with meals - Tresiba 15 units daily  Please decrease: - Glipizide ER to 10 mg in am and 5 mg before dinner  Please return in 6 months with your sugar log.   - at today's visit: HbA1c is higher: 7.2% - advised to check sugars at different times of the day - 1x a day, rotating check times - advised for yearly eye exams >> she is UTD - she has labs coming up with new PCP (Dr. Rex Kras is retiring) - return to clinic in 6 months  2. HL -Reviewed latest lipid panel: Excellent LDL -Continues lovastatin 10 mg daily without side effects  3.  Obesity class I -She gained 3 pounds before last visit, but lost 11 since then, most likely due to dental work -Continue metformin which has a mild appetite  suppressant  Philemon Kingdom, MD PhD [facility] Endocrinology

## 2021-03-06 DIAGNOSIS — E119 Type 2 diabetes mellitus without complications: Secondary | ICD-10-CM | POA: Diagnosis not present

## 2021-03-06 DIAGNOSIS — Z961 Presence of intraocular lens: Secondary | ICD-10-CM | POA: Diagnosis not present

## 2021-03-06 DIAGNOSIS — Z7984 Long term (current) use of oral hypoglycemic drugs: Secondary | ICD-10-CM | POA: Diagnosis not present

## 2021-03-09 ENCOUNTER — Telehealth: Payer: Self-pay | Admitting: Internal Medicine

## 2021-03-09 NOTE — Telephone Encounter (Signed)
Patient's daughter Tressia Miners requests to be called at ph# [phone] re: Tressia Miners states she is concerned about Patient's cognitive function/displaying signs of a sundowners. Traci states she feels Patient needs to be seen by a Neurologist and Patient's PCP has retired. Traci requests Dr. Cruzita Lederer help Patient be seen by a Neurologist sooner than later. Traci states Patient stated that she feels she is going crazy. Traci states Patient is having difficulty with her memory.

## 2021-03-13 NOTE — Telephone Encounter (Signed)
Daughter called to check on status of this request for a neurological consult/referral.

## 2021-03-14 NOTE — Telephone Encounter (Signed)
Please advise 

## 2021-03-30 NOTE — Telephone Encounter (Signed)
Called and left a message for pt to call back regarding referral.

## 2021-03-31 ENCOUNTER — Other Ambulatory Visit: Payer: Self-pay | Admitting: Internal Medicine

## 2021-03-31 DIAGNOSIS — F0280 Dementia in other diseases classified elsewhere without behavioral disturbance: Secondary | ICD-10-CM

## 2021-03-31 NOTE — Telephone Encounter (Signed)
Called and spoke with Traci and she worried about possible Alzheimer's disease (Type 3 diabetes) and she is wanting to know if you know any Neurologist that may specialize in this (possible under the [facility] umbrella). Her mom's PCP recently retired and she is worried in the time it may take to find another PCP her symptoms may worsen. So if we know any options available to assist.

## 2021-03-31 NOTE — Telephone Encounter (Signed)
Ok, I understand.  I placed a referral to [facility] neurology.  I am not sure about their availability, though.

## 2021-03-31 NOTE — Telephone Encounter (Signed)
[place] (daughter) - asks for a call back regarding what might be going on with her mom. She did not know if the memory problems and stuff are results of medications she's been taking or if it might be dementia.

## 2021-04-03 NOTE — Telephone Encounter (Signed)
Called and advised Traci of referral.

## 2021-04-08 ENCOUNTER — Emergency Department (HOSPITAL_BASED_OUTPATIENT_CLINIC_OR_DEPARTMENT_OTHER): Payer: Medicare Other

## 2021-04-08 ENCOUNTER — Encounter (HOSPITAL_BASED_OUTPATIENT_CLINIC_OR_DEPARTMENT_OTHER): Payer: Self-pay | Admitting: Emergency Medicine

## 2021-04-08 ENCOUNTER — Emergency Department (HOSPITAL_BASED_OUTPATIENT_CLINIC_OR_DEPARTMENT_OTHER)
Admission: EM | Admit: 2021-04-08 | Discharge: 2021-04-08 | Disposition: A | Discharge status: Home / Self Care | Payer: Medicare Other | Attending: Emergency Medicine | Admitting: Emergency Medicine

## 2021-04-08 ENCOUNTER — Other Ambulatory Visit: Payer: Self-pay | Admitting: Internal Medicine

## 2021-04-08 ENCOUNTER — Other Ambulatory Visit: Payer: Self-pay

## 2021-04-08 DIAGNOSIS — I700 Atherosclerosis of aorta: Secondary | ICD-10-CM | POA: Diagnosis not present

## 2021-04-08 DIAGNOSIS — I10 Essential (primary) hypertension: Secondary | ICD-10-CM | POA: Insufficient documentation

## 2021-04-08 DIAGNOSIS — R9389 Abnormal findings on diagnostic imaging of other specified body structures: Secondary | ICD-10-CM | POA: Diagnosis not present

## 2021-04-08 DIAGNOSIS — S92351A Displaced fracture of fifth metatarsal bone, right foot, initial encounter for closed fracture: Secondary | ICD-10-CM | POA: Insufficient documentation

## 2021-04-08 DIAGNOSIS — R942 Abnormal results of pulmonary function studies: Secondary | ICD-10-CM | POA: Insufficient documentation

## 2021-04-08 DIAGNOSIS — Z794 Long term (current) use of insulin: Secondary | ICD-10-CM | POA: Insufficient documentation

## 2021-04-08 DIAGNOSIS — Y93E5 Activity, floor mopping and cleaning: Secondary | ICD-10-CM | POA: Insufficient documentation

## 2021-04-08 DIAGNOSIS — Z87891 Personal history of nicotine dependence: Secondary | ICD-10-CM | POA: Diagnosis not present

## 2021-04-08 DIAGNOSIS — Z7984 Long term (current) use of oral hypoglycemic drugs: Secondary | ICD-10-CM | POA: Diagnosis not present

## 2021-04-08 DIAGNOSIS — S92301A Fracture of unspecified metatarsal bone(s), right foot, initial encounter for closed fracture: Secondary | ICD-10-CM

## 2021-04-08 DIAGNOSIS — N390 Urinary tract infection, site not specified: Secondary | ICD-10-CM | POA: Insufficient documentation

## 2021-04-08 DIAGNOSIS — E785 Hyperlipidemia, unspecified: Secondary | ICD-10-CM | POA: Diagnosis not present

## 2021-04-08 DIAGNOSIS — R0902 Hypoxemia: Secondary | ICD-10-CM | POA: Diagnosis not present

## 2021-04-08 DIAGNOSIS — Z79899 Other long term (current) drug therapy: Secondary | ICD-10-CM | POA: Diagnosis not present

## 2021-04-08 DIAGNOSIS — S99921A Unspecified injury of right foot, initial encounter: Secondary | ICD-10-CM | POA: Diagnosis present

## 2021-04-08 DIAGNOSIS — E1169 Type 2 diabetes mellitus with other specified complication: Secondary | ICD-10-CM | POA: Insufficient documentation

## 2021-04-08 DIAGNOSIS — S92331A Displaced fracture of third metatarsal bone, right foot, initial encounter for closed fracture: Secondary | ICD-10-CM | POA: Insufficient documentation

## 2021-04-08 DIAGNOSIS — Z85038 Personal history of other malignant neoplasm of large intestine: Secondary | ICD-10-CM | POA: Diagnosis not present

## 2021-04-08 DIAGNOSIS — W1839XA Other fall on same level, initial encounter: Secondary | ICD-10-CM | POA: Insufficient documentation

## 2021-04-08 DIAGNOSIS — R55 Syncope and collapse: Secondary | ICD-10-CM | POA: Diagnosis not present

## 2021-04-08 DIAGNOSIS — S0990XA Unspecified injury of head, initial encounter: Secondary | ICD-10-CM | POA: Diagnosis not present

## 2021-04-08 DIAGNOSIS — Y92003 Bedroom of unspecified non-institutional (private) residence as the place of occurrence of the external cause: Secondary | ICD-10-CM | POA: Insufficient documentation

## 2021-04-08 DIAGNOSIS — S199XXA Unspecified injury of neck, initial encounter: Secondary | ICD-10-CM | POA: Diagnosis not present

## 2021-04-08 DIAGNOSIS — M25532 Pain in left wrist: Secondary | ICD-10-CM | POA: Diagnosis not present

## 2021-04-08 DIAGNOSIS — S92341A Displaced fracture of fourth metatarsal bone, right foot, initial encounter for closed fracture: Secondary | ICD-10-CM | POA: Insufficient documentation

## 2021-04-08 DIAGNOSIS — M25571 Pain in right ankle and joints of right foot: Secondary | ICD-10-CM | POA: Diagnosis not present

## 2021-04-08 LAB — CBC WITH DIFFERENTIAL/PLATELET
Abs Immature Granulocytes: 0.01 10*3/uL (ref 0.00–0.07)
Basophils Absolute: 0.1 10*3/uL (ref 0.0–0.1)
Basophils Relative: 1 %
Eosinophils Absolute: 0.2 10*3/uL (ref 0.0–0.5)
Eosinophils Relative: 3 %
HCT: 35.8 % — ABNORMAL LOW (ref 36.0–46.0)
Hemoglobin: 11.6 g/dL — ABNORMAL LOW (ref 12.0–15.0)
Immature Granulocytes: 0 %
Lymphocytes Relative: 22 %
Lymphs Abs: 1.8 10*3/uL (ref 0.7–4.0)
MCH: 30.8 pg (ref 26.0–34.0)
MCHC: 32.4 g/dL (ref 30.0–36.0)
MCV: 95 fL (ref 80.0–100.0)
Monocytes Absolute: 0.4 10*3/uL (ref 0.1–1.0)
Monocytes Relative: 5 %
Neutro Abs: 5.7 10*3/uL (ref 1.7–7.7)
Neutrophils Relative %: 69 %
Platelets: 288 10*3/uL (ref 150–400)
RBC: 3.77 MIL/uL — ABNORMAL LOW (ref 3.87–5.11)
RDW: 13.4 % (ref 11.5–15.5)
WBC: 8.2 10*3/uL (ref 4.0–10.5)
nRBC: 0 % (ref 0.0–0.2)

## 2021-04-08 LAB — COMPREHENSIVE METABOLIC PANEL
ALT: 14 U/L (ref 0–44)
AST: 19 U/L (ref 15–41)
Albumin: 4.2 g/dL (ref 3.5–5.0)
Alkaline Phosphatase: 88 U/L (ref 38–126)
Anion gap: 10 (ref 5–15)
BUN: 25 mg/dL — ABNORMAL HIGH (ref 8–23)
CO2: 24 mmol/L (ref 22–32)
Calcium: 9.2 mg/dL (ref 8.9–10.3)
Chloride: 105 mmol/L (ref 98–111)
Creatinine, Ser: 1.41 mg/dL — ABNORMAL HIGH (ref 0.44–1.00)
GFR, Estimated: 38 mL/min — ABNORMAL LOW (ref >60)
Glucose, Bld: 191 mg/dL — ABNORMAL HIGH (ref 70–99)
Potassium: 4.2 mmol/L (ref 3.5–5.1)
Sodium: 139 mmol/L (ref 135–145)
Total Bilirubin: 0.3 mg/dL (ref 0.3–1.2)
Total Protein: 7 g/dL (ref 6.5–8.1)

## 2021-04-08 LAB — URINALYSIS, ROUTINE W REFLEX MICROSCOPIC
Bilirubin Urine: NEGATIVE
Glucose, UA: NEGATIVE mg/dL
Hgb urine dipstick: NEGATIVE
Ketones, ur: NEGATIVE mg/dL
Leukocytes,Ua: NEGATIVE
Nitrite: POSITIVE — AB
Protein, ur: NEGATIVE mg/dL
Specific Gravity, Urine: 1.012 (ref 1.005–1.030)
pH: 5.5 (ref 5.0–8.0)

## 2021-04-08 LAB — CBG MONITORING, ED: Glucose-Capillary: 188 mg/dL — ABNORMAL HIGH (ref 70–99)

## 2021-04-08 LAB — TROPONIN I (HIGH SENSITIVITY): Troponin I (High Sensitivity): 10 ng/L (ref <18)

## 2021-04-08 MED ORDER — CEPHALEXIN 500 MG PO CAPS: 500.0000 mg | ORAL_CAPSULE | Freq: Three times a day (TID) | ORAL | 0 refills | Status: AC

## 2021-04-08 MED ORDER — CEPHALEXIN 500 MG PO CAPS: 500.0000 mg | ORAL_CAPSULE | Freq: Three times a day (TID) | ORAL | 0 refills | Status: DC

## 2021-04-08 MED ORDER — HYDROCHLOROTHIAZIDE 25 MG PO TABS
25.0000 mg | ORAL_TABLET | Freq: Once | ORAL | Status: AC
  Administered 2021-04-08: 25 mg via ORAL
  Filled 2021-04-08: qty 1

## 2021-04-08 MED ORDER — NAPROXEN 500 MG PO TABS: 500.0000 mg | ORAL_TABLET | Freq: Two times a day (BID) | ORAL | 0 refills | Status: DC

## 2021-04-08 MED ORDER — CEPHALEXIN 250 MG PO CAPS
500.0000 mg | ORAL_CAPSULE | Freq: Once | ORAL | Status: AC
  Administered 2021-04-08: 500 mg via ORAL
  Filled 2021-04-08: qty 2

## 2021-04-08 MED ORDER — IOHEXOL 350 MG/ML SOLN
75.0000 mL | Freq: Once | INTRAVENOUS | Status: AC | PRN
  Administered 2021-04-08: 75 mL via INTRAVENOUS

## 2021-04-08 MED ORDER — LISINOPRIL 10 MG PO TABS
10.0000 mg | ORAL_TABLET | Freq: Once | ORAL | Status: AC
  Administered 2021-04-08: 10 mg via ORAL
  Filled 2021-04-08: qty 1

## 2021-04-08 NOTE — ED Notes (Signed)
Pt now complaining of right hand pain as well.

## 2021-04-08 NOTE — ED Notes (Signed)
Pt's daughter clarified EMS statements that this was an unwitnessed fall but they did hear her fall at the time of the incident. Pt's daughter were downstairs cleaning  in her house . Pt's daughter stated that the Pt was wpale when they found her and that she had fallen backward instead of forward. Pt Complains of dry mouth. Pt thinks she remembers the fall but is unsure.

## 2021-04-08 NOTE — ED Notes (Signed)
Patient transported to CT 

## 2021-04-08 NOTE — Progress Notes (Signed)
CT was delayed d/t pt needing to pee prior to going for scan, multiple attempts were made to obtain pt.

## 2021-04-08 NOTE — ED Notes (Signed)
Patient refused crutches prefers walker

## 2021-04-08 NOTE — ED Provider Notes (Signed)
[facility] DEPT Provider Note   CSN: [id number] Arrival date & time: 04/08/21  1114     History Chief Complaint  Patient presents with   Beth [name] is a 80 y.o. female.   Fall   This patient is a very pleasant 80 year old female presenting from home in the care of her daughter by EMS.  She has a history of sarcoidosis of the eye, there was a history of possibly having some of the lung, she has a history of hyperlipidemia and hypertension and is on ramipril and hydrochlorothiazide.  The patient presents after having a fall, evidently they were cleaning out her house and she had been going up and down the stairs several times when she ultimately had an unwitnessed fall in her bedroom.  When the family members got to her seconds later after hearing her fall they found her to be on the ground responsive but very pale, over the next several minutes she seemed to return to her baseline and they helped her sit up on a cedar chest.  She then had another recurrent episode of feeling very pale like she was going to pass out.  There was no history of actually having a syncopal episode that was witnessed by family, the paramedics arrived and found the patient to be in a normal heart rhythm, her CBG was less than 200, she had already had half of a biscuit this morning.  She denies any diarrhea or rectal bleeding or dysuria, she had urinated this morning but the thought was that she may be dehydrated since it was difficult to start an IV or draw blood.  At this time the patient has no complaints, she does not have a headache chest pain or palpitations, no abdominal pain, she does have pain in her right foot from the fall.  Past Medical History:  Diagnosis Date   Arthritis    neck and all over   Cancer [facility]) 1998   hx colon ca-chemo radiation; lung; denies lung involvement    Chronic back pain    Diabetes mellitus without complication ([place])    History of Raynaud's  syndrome    Hyperlipemia    Hypertension    Psoriasis    afflicts bed of fingernails    Sarcoidosis    of the eye; 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 ; reports hasnt had any affliction in years    Wears glasses    Wears partial dentures    top and bottom partials    Patient Active Problem List   Diagnosis Date Noted   Spinal stenosis, 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   Overweight (BMI 25.0-29.9) 04/15/2018   Essential hypertension 04/15/2018   Cervical spine pain 03/22/2018   Lumbar pain 03/22/2018   Mass 12/23/2017   Hyperlipidemia 06/14/2017   Nail deformity 07/30/2016   Type 2 diabetes mellitus with hyperglycemia, without long-term current use of insulin ([place]) 04/13/2016    Past Surgical History:  Procedure Laterality Date   ABDOMINAL HYSTERECTOMY     APPENDECTOMY     CATARACT EXTRACTION, BILATERAL     COLONOSCOPY     many   COMBINED MEDIASTINOSCOPY AND BRONCHOSCOPY  2002   biopsy   DG ARTHRO THUMB*R*  2011   ablasion rt thumb nailbed   FORAMINOTOMY 1 LEVEL Bilateral 05/09/2018   Procedure: Foraminotomy L4-L5 Root;  Surgeon: [name], MD;  Location: [facility];  Service: Orthopedics;  Laterality: Bilateral;   I & D EXTREMITY  Right 07/23/2013   Procedure: IRRIGATION AND DEBRIDEMENT DIP JOINT RIGHT INDEX FINGER, EXCISE MUCOID CYST RIGHT INDEX FINGER  ;  Surgeon: Cammie Sickle., MD;  Location: [facility];  Service: Orthopedics;  Laterality: Right;   LIPOMA EXCISION     LUMBAR LAMINECTOMY/DECOMPRESSION MICRODISCECTOMY N/A 05/09/2018   Procedure: Complete decompressive lumbar laminectomy L4-L5;  Surgeon: [name], MD;  Location: [facility];  Service: Orthopedics;  Laterality: N/A;  148min   MASS EXCISION Right 09/28/2014   Procedure: EXCISION MASS RIGHT MIDDLE FINGER;  Surgeon: Daryll Brod, MD;  Location: [facility];  Service: Orthopedics;  Laterality: Right;  ANESTHESIA:  GENERAL, IV REGIONAL FAB   MASS EXCISION Bilateral  12/24/2017   Procedure: EXCISION MASS RIGHT SMALL, LEFT INDEX, excision left nail horn middle finger;  Surgeon: Daryll Brod, MD;  Location: [facility];  Service: Orthopedics;  Laterality: Bilateral;  FAB   NAILBED REPAIR Left 08/28/2016   Procedure: Ablation nail matrix left index finger andl left middle finger;  Surgeon: Daryll Brod, MD;  Location: [facility];  Service: Orthopedics;  Laterality: Left;   PARTIAL COLECTOMY  1998   cancer-rad/chemo   SKIN FULL THICKNESS GRAFT Right 07/23/2013   Procedure: NAIL PLATE ABLATION WITH FULL THICKNESS SKIN GRAFT FROM RIGHT   MEDIAL ELBOW TO RIGHT LONG FINGER NAIL BED;  Surgeon: Cammie Sickle., MD;  Location: [facility];  Service: Orthopedics;  Laterality: Right;   SKIN FULL THICKNESS GRAFT Left 08/28/2016   Procedure: SKIN GRAFT FULL THICKNESS upper left arm;  Surgeon: Daryll Brod, MD;  Location: [facility];  Service: Orthopedics;  Laterality: Left;     OB History   No obstetric history on file.     Family History  Problem Relation Age of Onset   Cancer Mother        colon    Hypertension Father    CVA Father    Obesity Brother     Social History   Tobacco Use   Smoking status: Former    Packs/day: 0.50    Types: Cigarettes    Quit date: 07/22/1971    Years since quitting: 49.7   Smokeless tobacco: Never  Vaping Use   Vaping Use: Never used  Substance Use Topics   Alcohol use: No    Alcohol/week: 0.0 standard drinks   Drug use: No    Home Medications Prior to Admission medications   Medication Sig Start Date End Date Taking? Authorizing Provider  acetaminophen (TYLENOL) 325 MG tablet Take 325-650 mg by mouth 2 (two) times daily as needed for moderate pain.    Yes [provider]  diphenhydramine-acetaminophen (TYLENOL PM) 25-500 MG TABS tablet Take 2 tablets by mouth at bedtime as needed (sleep).    Yes [provider]  DROPLET PEN NEEDLES 32G X 5 MM  MISC USE TO INJECT INSULIN ONE TIME DAILY AS DIRECTED 12/26/20  Yes Philemon Kingdom, MD  glipiZIDE (GLUCOTROL XL) 10 MG 24 hr tablet TAKE 2 TABLETs in am and 1 tablet before dinner 01/31/21  Yes Philemon Kingdom, MD  hydrochlorothiazide (HYDRODIURIL) 25 MG tablet Take 1 tablet (25 mg total) by mouth daily. 09/22/16  Yes Tanna Furry, MD  insulin degludec (TRESIBA FLEXTOUCH) 100 UNIT/ML FlexTouch Pen INJECT 15 UNITS SUBCUTANEOUSLY DAILY 01/31/21  Yes Philemon Kingdom, MD  lovastatin (MEVACOR) 10 MG tablet Take 10 mg by mouth every evening.    Yes [provider]  metFORMIN (GLUCOPHAGE) 1000 MG  tablet TAKE 1 TABLET TWICE DAILY WITH MEALS 01/31/21  Yes Philemon Kingdom, MD  Multiple Vitamin (MULTIVITAMIN) tablet Take 1 tablet by mouth daily.   Yes [provider]  naproxen (NAPROSYN) 500 MG tablet Take 1 tablet (500 mg total) by mouth 2 (two) times daily with a meal. 04/08/21  Yes Noemi Chapel, MD  cephALEXin (KEFLEX) 500 MG capsule Take 1 capsule (500 mg total) by mouth 3 (three) times daily for 7 days. 04/08/21 04/15/21  Noemi Chapel, MD  glucose blood test strip 1 each by Other route daily. Use as instructed    [provider]  loratadine (CLARITIN) 10 MG tablet Take 10 mg by mouth daily as needed for allergies.    [provider]  neomycin-polymyxin-hydrocortisone (CORTISPORIN) OTIC solution Apply 1-2 drops to toe BID after soaking 07/01/19   Wallene Huh, DPM  ramipril (ALTACE) 10 MG capsule Take 10 mg by mouth daily.    [provider]  Tetrahydrozoline HCl (VISINE OP) Place 1 drop into both eyes daily as needed (irritation).    [provider]    Allergies    Canagliflozin and Liraglutide  Review of Systems   Review of Systems  All other systems reviewed and are negative.  Physical Exam Updated Vital Signs BP (!) 175/80 (BP Location: Left Arm)   Pulse 85   Resp 16   Ht 1.651 m (5\' 5" )   Wt 71.7 kg   SpO2 98%   BMI 26.29 kg/m    Physical Exam Vitals and nursing note reviewed.  Constitutional:      General: She is not in acute distress.    Appearance: She is well-developed.  HENT:     Head: Normocephalic and atraumatic.     Comments: No signs of trauma to the head    Mouth/Throat:     Pharynx: No oropharyngeal exudate.  Eyes:     General: No scleral icterus.       Right eye: No discharge.        Left eye: No discharge.     Conjunctiva/sclera: Conjunctivae normal.     Pupils: Pupils are equal, round, and reactive to light.  Neck:     Thyroid: No thyromegaly.     Vascular: No JVD.  Cardiovascular:     Rate and Rhythm: Normal rate and regular rhythm.     Heart sounds: Normal heart sounds. No murmur heard.   No friction rub. No gallop.  Pulmonary:     Effort: Pulmonary effort is normal. No respiratory distress.     Breath sounds: Normal breath sounds. No wheezing or rales.  Abdominal:     General: Bowel sounds are normal. There is no distension.     Palpations: Abdomen is soft. There is no mass.     Tenderness: There is no abdominal tenderness.  Musculoskeletal:        General: Tenderness present. Normal range of motion.     Cervical back: Normal range of motion and neck supple.     Comments: There is mild tenderness over the right midfoot  Lymphadenopathy:     Cervical: No cervical adenopathy.  Skin:    General: Skin is warm and dry.     Findings: No erythema or rash.  Neurological:     General: No focal deficit present.     Mental Status: She is alert.     Coordination: Coordination normal.     Comments: The patient is awake alert and able to follow commands without any  difficulty  Psychiatric:        Behavior: Behavior normal.    ED Results / Procedures / Treatments   Labs (all labs ordered are listed, but only abnormal results are displayed) Labs Reviewed  COMPREHENSIVE METABOLIC PANEL - Abnormal; Notable for the following components:      Result Value   Glucose, Bld 191 (*)    BUN  25 (*)    Creatinine, Ser 1.41 (*)    GFR, Estimated 38 (*)    All other components within normal limits  CBC WITH DIFFERENTIAL/PLATELET - Abnormal; Notable for the following components:   RBC 3.77 (*)    Hemoglobin 11.6 (*)    HCT 35.8 (*)    All other components within normal limits  URINALYSIS, ROUTINE W REFLEX MICROSCOPIC - Abnormal; Notable for the following components:   Nitrite POSITIVE (*)    Bacteria, UA MANY (*)    All other components within normal limits  CBG MONITORING, ED - Abnormal; Notable for the following components:   Glucose-Capillary 188 (*)    All other components within normal limits  URINE CULTURE  TROPONIN I (HIGH SENSITIVITY)    EKG EKG Interpretation  Date/Time:  Saturday April 08 2021 11:22:30 EDT Ventricular Rate:  82 PR Interval:  121 QRS Duration: 81 QT Interval:  387 QTC Calculation: 452 R Axis:   9 Text Interpretation: Sinus rhythm Confirmed by Noemi Chapel [phone]) on 04/08/2021 2:01:27 PM  Radiology DG Wrist Complete Left  Result Date: 04/08/2021 CLINICAL DATA:  Left wrist pain following a fall this morning. EXAM: LEFT WRIST - COMPLETE 3+ VIEW COMPARISON:  None. FINDINGS: The distal ulna is shortened relative to the distal radius. No fracture or dislocation seen. The lateral view is limited by mild obliquity. IMPRESSION: 1. No fracture. 2. Negative ulnar variance. Electronically Signed   By: Claudie Revering M.D.   On: 04/08/2021 13:32   CT Head Wo Contrast  Result Date: 04/08/2021 CLINICAL DATA:  Head and neck trauma, altered mental status EXAM: CT HEAD WITHOUT CONTRAST CT CERVICAL SPINE WITHOUT CONTRAST TECHNIQUE: Multidetector CT imaging of the head and cervical spine was performed following the standard protocol without intravenous contrast. Multiplanar CT image reconstructions of the cervical spine were also generated. COMPARISON:  None. FINDINGS: CT HEAD FINDINGS Brain: No evidence of acute infarction, hemorrhage, hydrocephalus, extra-axial  collection or mass lesion/mass effect. Cerebral cortical volume loss. Mild central volume loss with ex vacuo ventriculomegaly. Moderate periventricular, subcortical and deep white matter hypoattenuation consistent with chronic microvascular ischemic white matter disease. Vascular: No hyperdense vessel or unexpected calcification. Skull: Normal. Negative for fracture or focal lesion. Sinuses/Orbits: No acute finding. Other: None. CT CERVICAL SPINE FINDINGS Alignment: Straightening of the normal cervical lordosis. Mild anterolisthesis (2 mm) of C3 on C4. Skull base and vertebrae: No evidence of acute fracture or malalignment. Soft tissues and spinal canal: No prevertebral fluid or swelling. No visible canal hematoma. Disc levels: Multi level degenerative disc disease most severe at C4-C5, C5-C6 and C6-C7. Mild grade 1 anterolisthesis of C3 on C4 with fairly minimal degenerative spondylosis. Mild bilateral facet arthropathy. Upper chest: 2.4 cm low-attenuation cyst/nodule in the right thyroid gland. Incompletely imaged dependent atelectasis in the lower lungs. Other: None. IMPRESSION: CT HEAD 1. No acute intracranial abnormality. 2. Cortical and central atrophy, ex vacuo ventriculomegaly and moderate chronic microvascular ischemic white matter disease. CT CSPINE 1. No acute fracture or malalignment. 2. 2.4 cm low-attenuation cyst/nodule in the right thyroid gland. Recommend dedicated thyroid ultrasound for further evaluation.  3. 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 4. Mild grade 1 anterolisthesis of C3 on C4 is almost certainly degenerative in nature. 5. Mild bilateral facet arthropathy. Electronically Signed   By: Jacqulynn Cadet M.D.   On: 04/08/2021 13:30   CT Angio Chest PE W and/or Wo Contrast  Result Date: 04/08/2021 CLINICAL DATA:  Low/intermediate probability for PE. Syncopal episode earlier today. EXAM: CT ANGIOGRAPHY CHEST WITH CONTRAST TECHNIQUE: Multidetector CT  imaging of the chest was performed using the standard protocol during bolus administration of intravenous contrast. Multiplanar CT image reconstructions and MIPs were obtained to evaluate the vascular anatomy. CONTRAST:  57mL OMNIPAQUE IOHEXOL 350 MG/ML SOLN COMPARISON:  None. FINDINGS: Cardiovascular: Satisfactory opacification of the pulmonary arteries to the segmental level. No evidence of pulmonary embolism. Normal heart size. No pericardial effusion. Extensive aortic atherosclerotic calcifications. Mediastinum/Nodes: Rounded enlargement of the right thyroid gland may represent goiter or underlying thyroid nodule. Visualization limited by streak artifact from contrast bolus in the right innominate vein. No suspicious mediastinal adenopathy or mass. Small scattered mediastinal lymph nodes are not enlarged by imaging criteria. Unremarkable esophagus. Lungs/Pleura: Thick walled cavitary mass in the right lung apex measures 3.0 x 2.6 x 2.8 cm in greatest dimension. There is spiculation surrounding the margin. Similarly, on the left there is a slightly smaller thick-walled cavitary lesion also in the apex measuring 3.2 x 1.7 cm. Spiculation also present around the second lesion. Mild lower lobe bronchial wall thickening. Dependent atelectasis. Upper Abdomen: No acute abnormality. Musculoskeletal: No chest wall abnormality. No acute or significant osseous findings. Review of the MIP images confirms the above findings. IMPRESSION: 1. Thick walled cavitary lesions with surrounding spiculation in the bilateral lung apices slightly larger on the right than the left. Differential considerations include synchronous upper lobe primary bronchogenic carcinoma versus an active infectious/inflammatory process. Recommend non emergent referral to pulmonology for further evaluation. 2. Globular and enlarged right thyroid gland may reflect underlying goiter or thyroid nodule. Recommend dedicated thyroid ultrasound (non emergent) for  further evaluation. 3. Nonspecific mediastinal lymph nodes are mildly prominent but not technically enlarged by imaging criteria. These may be reactive, or metastatic depending on the pathology of the upper lobe lesions. 4. Extensive aortic atherosclerotic calcifications. Aortic Atherosclerosis (ICD10-170.0). Electronically Signed   By: Jacqulynn Cadet M.D.   On: 04/08/2021 14:51   CT Cervical Spine Wo Contrast  Result Date: 04/08/2021 CLINICAL DATA:  Head and neck trauma, altered mental status EXAM: CT HEAD WITHOUT CONTRAST CT CERVICAL SPINE WITHOUT CONTRAST TECHNIQUE: Multidetector CT imaging of the head and cervical spine was performed following the standard protocol without intravenous contrast. Multiplanar CT image reconstructions of the cervical spine were also generated. COMPARISON:  None. FINDINGS: CT HEAD FINDINGS Brain: No evidence of acute infarction, hemorrhage, hydrocephalus, extra-axial collection or mass lesion/mass effect. Cerebral cortical volume loss. Mild central volume loss with ex vacuo ventriculomegaly. Moderate periventricular, subcortical and deep white matter hypoattenuation consistent with chronic microvascular ischemic white matter disease. Vascular: No hyperdense vessel or unexpected calcification. Skull: Normal. Negative for fracture or focal lesion. Sinuses/Orbits: No acute finding. Other: None. CT CERVICAL SPINE FINDINGS Alignment: Straightening of the normal cervical lordosis. Mild anterolisthesis (2 mm) of C3 on C4. Skull base and vertebrae: No evidence of acute fracture or malalignment. Soft tissues and spinal canal: No prevertebral fluid or swelling. No visible canal hematoma. Disc levels: Multi level degenerative disc disease most severe at C4-C5, C5-C6 and C6-C7. Mild grade 1 anterolisthesis of C3 on C4 with  fairly minimal degenerative spondylosis. Mild bilateral facet arthropathy. Upper chest: 2.4 cm low-attenuation cyst/nodule in the right thyroid gland. Incompletely  imaged dependent atelectasis in the lower lungs. Other: None. IMPRESSION: CT HEAD 1. No acute intracranial abnormality. 2. Cortical and central atrophy, ex vacuo ventriculomegaly and moderate chronic microvascular ischemic white matter disease. CT CSPINE 1. No acute fracture or malalignment. 2. 2.4 cm low-attenuation cyst/nodule in the right thyroid gland. Recommend dedicated thyroid ultrasound for further evaluation. 3. 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 4. Mild grade 1 anterolisthesis of C3 on C4 is almost certainly degenerative in nature. 5. Mild bilateral facet arthropathy. Electronically Signed   By: Jacqulynn Cadet M.D.   On: 04/08/2021 13:30   DG Chest Port 1 View  Result Date: 04/08/2021 CLINICAL DATA:  Syncope EXAM: PORTABLE CHEST 1 VIEW COMPARISON:  None. FINDINGS: Approximately 3.2 x 1.5 cm ovoid opacity with central lucency in the left upper lung is concerning for a possible cavitary lesion versus thick walled bulla. Chronic bronchitic changes. Low inspiratory volumes. Cardiac and mediastinal contours are within normal limits. No acute osseous abnormality. IMPRESSION: 1. Cavitary nodule versus thick-walled below in the left lung apex. Consider further evaluation with noncontrast enhanced CT scan of the chest. 2. Low inspiratory volumes. 3. Probable chronic bronchitic changes. Electronically Signed   By: Jacqulynn Cadet M.D.   On: 04/08/2021 12:36   DG Foot Complete Right  Result Date: 04/08/2021 CLINICAL DATA:  Syncope, foot pain EXAM: RIGHT FOOT COMPLETE - 3+ VIEW COMPARISON:  None. FINDINGS: Acute minimally displaced fractures through the neck of the third, fourth and fifth metatarsals. There is associated overlying soft tissue swelling. The remaining bones and joints are intact. The bones appear mildly demineralized. IMPRESSION: Acute minimally displaced fractures through the necks of the third, fourth and fifth metatarsals. Electronically Signed   By:  Jacqulynn Cadet M.D.   On: 04/08/2021 12:37    Procedures Procedures   Medications Ordered in ED Medications  cephALEXin (KEFLEX) capsule 500 mg (has no administration in time range)  lisinopril (ZESTRIL) tablet 10 mg (10 mg Oral Given 04/08/21 1358)  hydrochlorothiazide (HYDRODIURIL) tablet 25 mg (25 mg Oral Given 04/08/21 1400)  iohexol (OMNIPAQUE) 350 MG/ML injection 75 mL (75 mLs Intravenous Contrast Given 04/08/21 1430)    ED Course  I have reviewed the triage vital signs and the nursing notes.  Pertinent labs & imaging results that were available during my care of the patient were reviewed by me and considered in my medical decision making (see chart for details).    MDM Rules/Calculators/A&P                          The patient will need a work-up for syncope including labs cardiac monitoring and an EKG.  I will also get a CT scan of the brain given the fall, the patient is agreeable to the plan, x-ray of the foot as well  Thankfully the patient's imaging does not reveal any signs of stroke, there is no signs of heart attack, cardiac monitoring has been reassuring, there is been only a normal sinus rhythm.  Labs are also reassuring except that the patient does have what appears to be bacteriuria and a possible urinary tract infection.  Cephalexin has been given.  X-ray of the foot reveals some metatarsal fractures and she has been given orthopedic follow-up, she will be placed in a splint and given crutches.  CT scan of the  patient's chest unfortunately does reveal some thick-walled spiculated lesions in the upper parts of her lungs.  She does have a history of sarcoid and thought that she may have something going on in the past, I will give her a referral to labao or pulmonology for further work-up.  The patient is agreeable, the family members are agreeable, the patient wants to be discharged and I think that is reasonable.  On repeat exam the patient is well-appearing stable  for discharge vital signs are reassuring except for mild hypertension which at this time is a very little consequence  Final Clinical Impression(s) / ED Diagnoses Final diagnoses:  Abnormal CT scan  Urinary tract infection without hematuria, site unspecified  Syncope, unspecified syncope type  Closed nondisplaced fracture of metatarsal bone of right foot, unspecified metatarsal, initial encounter    Rx / DC Orders ED Discharge Orders          Ordered    cephALEXin (KEFLEX) 500 MG capsule  3 times daily,   Status:  Discontinued        04/08/21 1456    cephALEXin (KEFLEX) 500 MG capsule  3 times daily        04/08/21 1457    naproxen (NAPROSYN) 500 MG tablet  2 times daily with meals        04/08/21 1459             Noemi Chapel, MD 04/08/21 1501

## 2021-04-08 NOTE — ED Triage Notes (Signed)
Pt arrived via EMS from home. Pt had a witnessed syncopal episode by her daughter at approx 1000 hrs. Pt was walking became pale and then fell face forward. EMS reported some redness to the Pt's right temple area. Pt did LOC. Pt denis N/V and blurry vision.   Pt complains of right foot pain.   Pt is NOT on thinners.  Pt states she is type 2 diabetic.  EMS Vitals WDL with a CBG of 199 on scene.   Pt is A& O x 4. And normally ambulatory with a cane at home

## 2021-04-08 NOTE — Discharge Instructions (Addendum)
Your testing today shows that there is probably a urinary tract infection present.  There is lots of bacteria in the urine.  I would like for you to take cephalexin 3 times a day for 7 days, drink plenty of clear liquids and rest.  You do have several broken bones in your foot.  I have given you a referral for the on-call orthopedist who is Dr. Tamera Punt.  I would recommend that you call the office for a follow-up this week.  You will not be able to put any weight on that foot so I will give you some crutches to be able to walk around.  Your CT scan also showed some abnormal findings in your lungs which could be related to sarcoid however one of the possibilities is that this could be related to a lung cancer as well.  They recommended that you follow-up very closely with a lung specialist.  I will give you a referral to the pulmonology team in town as well.  Please return to the emergency department for severe worsening symptoms.

## 2021-04-08 NOTE — ED Notes (Signed)
Pt requested I temporarly leave in an attmept to urinate. Will attempt IV and update vitals when Pt has gone.

## 2021-04-10 DIAGNOSIS — M79671 Pain in right foot: Secondary | ICD-10-CM | POA: Diagnosis not present

## 2021-04-10 LAB — URINE CULTURE: Culture: >=100000 — AB

## 2021-04-11 ENCOUNTER — Telehealth: Payer: Self-pay | Admitting: *Deleted

## 2021-04-11 NOTE — Telephone Encounter (Signed)
Post ED Visit - Positive Culture Follow-up  Culture report reviewed by antimicrobial stewardship pharmacist: [place] Team []  Elenor Quinones, Pharm.D. []  Heide Guile, Pharm.D., BCPS AQ-ID []  Parks Neptune, Pharm.D., BCPS []  Alycia Rossetti, Pharm.D., BCPS []  [place], Pharm.D., BCPS, AAHIVP []  Legrand Como, Pharm.D., BCPS, AAHIVP []  Salome Arnt, PharmD, BCPS []  Johnnette Gourd, PharmD, BCPS []  Hughes Better, PharmD, BCPS []  Leeroy Cha, PharmD []  Laqueta Linden, PharmD, BCPS []  Albertina Parr, PharmD  [place] Team []  Leodis Sias, PharmD []  Lindell Spar, PharmD []  Royetta Asal, PharmD []  Graylin Shiver, Rph []  Rema Fendt) Glennon Mac, PharmD []  Arlyn Dunning, PharmD []  Netta Cedars, PharmD []  Dia Sitter, PharmD []  Leone Haven, PharmD []  Gretta Arab, PharmD []  Theodis Shove, PharmD []  Peggyann Juba, PharmD []  Reuel Boom, PharmD   Positive urine culture Treated with Cephalexin, organism sensitive to the same and no further patient follow-up is required at this time.  Joetta Manners, PharmD  Harlon Flor Talley 04/11/2021, 9:12 AM

## 2021-04-12 DIAGNOSIS — N390 Urinary tract infection, site not specified: Secondary | ICD-10-CM | POA: Diagnosis not present

## 2021-04-12 DIAGNOSIS — E041 Nontoxic single thyroid nodule: Secondary | ICD-10-CM | POA: Diagnosis not present

## 2021-04-12 DIAGNOSIS — Z7984 Long term (current) use of oral hypoglycemic drugs: Secondary | ICD-10-CM | POA: Diagnosis not present

## 2021-04-12 DIAGNOSIS — I10 Essential (primary) hypertension: Secondary | ICD-10-CM | POA: Diagnosis not present

## 2021-04-12 DIAGNOSIS — E119 Type 2 diabetes mellitus without complications: Secondary | ICD-10-CM | POA: Diagnosis not present

## 2021-04-12 DIAGNOSIS — J984 Other disorders of lung: Secondary | ICD-10-CM | POA: Diagnosis not present

## 2021-04-12 DIAGNOSIS — Z0000 Encounter for general adult medical examination without abnormal findings: Secondary | ICD-10-CM | POA: Diagnosis not present

## 2021-04-12 DIAGNOSIS — E782 Mixed hyperlipidemia: Secondary | ICD-10-CM | POA: Diagnosis not present

## 2021-04-12 DIAGNOSIS — R55 Syncope and collapse: Secondary | ICD-10-CM | POA: Diagnosis not present

## 2021-04-12 DIAGNOSIS — N189 Chronic kidney disease, unspecified: Secondary | ICD-10-CM | POA: Diagnosis not present

## 2021-04-17 DIAGNOSIS — Z7984 Long term (current) use of oral hypoglycemic drugs: Secondary | ICD-10-CM | POA: Diagnosis not present

## 2021-04-17 DIAGNOSIS — N189 Chronic kidney disease, unspecified: Secondary | ICD-10-CM | POA: Diagnosis not present

## 2021-04-17 DIAGNOSIS — N390 Urinary tract infection, site not specified: Secondary | ICD-10-CM | POA: Diagnosis not present

## 2021-04-17 DIAGNOSIS — Z0000 Encounter for general adult medical examination without abnormal findings: Secondary | ICD-10-CM | POA: Diagnosis not present

## 2021-04-17 DIAGNOSIS — K219 Gastro-esophageal reflux disease without esophagitis: Secondary | ICD-10-CM | POA: Diagnosis not present

## 2021-04-17 DIAGNOSIS — I10 Essential (primary) hypertension: Secondary | ICD-10-CM | POA: Diagnosis not present

## 2021-04-17 DIAGNOSIS — Z85038 Personal history of other malignant neoplasm of large intestine: Secondary | ICD-10-CM | POA: Diagnosis not present

## 2021-04-17 DIAGNOSIS — E119 Type 2 diabetes mellitus without complications: Secondary | ICD-10-CM | POA: Diagnosis not present

## 2021-04-17 DIAGNOSIS — E782 Mixed hyperlipidemia: Secondary | ICD-10-CM | POA: Diagnosis not present

## 2021-04-20 DIAGNOSIS — M79671 Pain in right foot: Secondary | ICD-10-CM | POA: Diagnosis not present

## 2021-04-28 DIAGNOSIS — E041 Nontoxic single thyroid nodule: Secondary | ICD-10-CM | POA: Diagnosis not present

## 2021-05-02 ENCOUNTER — Other Ambulatory Visit: Payer: Self-pay | Admitting: Family Medicine

## 2021-05-02 DIAGNOSIS — E041 Nontoxic single thyroid nodule: Secondary | ICD-10-CM

## 2021-05-10 ENCOUNTER — Emergency Department (HOSPITAL_COMMUNITY)
Admission: EM | Admit: 2021-05-10 | Discharge: 2021-05-10 | Disposition: A | Discharge status: Home / Self Care | Payer: Medicare Other | Attending: Emergency Medicine | Admitting: Emergency Medicine

## 2021-05-10 ENCOUNTER — Encounter (HOSPITAL_COMMUNITY): Payer: Self-pay | Admitting: Emergency Medicine

## 2021-05-10 DIAGNOSIS — Z87891 Personal history of nicotine dependence: Secondary | ICD-10-CM | POA: Diagnosis not present

## 2021-05-10 DIAGNOSIS — Z794 Long term (current) use of insulin: Secondary | ICD-10-CM | POA: Diagnosis not present

## 2021-05-10 DIAGNOSIS — E11649 Type 2 diabetes mellitus with hypoglycemia without coma: Secondary | ICD-10-CM | POA: Diagnosis not present

## 2021-05-10 DIAGNOSIS — E162 Hypoglycemia, unspecified: Secondary | ICD-10-CM

## 2021-05-10 DIAGNOSIS — Z85038 Personal history of other malignant neoplasm of large intestine: Secondary | ICD-10-CM | POA: Diagnosis not present

## 2021-05-10 DIAGNOSIS — Z7984 Long term (current) use of oral hypoglycemic drugs: Secondary | ICD-10-CM | POA: Diagnosis not present

## 2021-05-10 DIAGNOSIS — E161 Other hypoglycemia: Secondary | ICD-10-CM | POA: Diagnosis not present

## 2021-05-10 DIAGNOSIS — I10 Essential (primary) hypertension: Secondary | ICD-10-CM

## 2021-05-10 DIAGNOSIS — R531 Weakness: Secondary | ICD-10-CM | POA: Diagnosis not present

## 2021-05-10 DIAGNOSIS — Z79899 Other long term (current) drug therapy: Secondary | ICD-10-CM | POA: Diagnosis not present

## 2021-05-10 DIAGNOSIS — R4781 Slurred speech: Secondary | ICD-10-CM | POA: Diagnosis not present

## 2021-05-10 LAB — BASIC METABOLIC PANEL
Anion gap: 7 (ref 5–15)
BUN: 24 mg/dL — ABNORMAL HIGH (ref 8–23)
CO2: 26 mmol/L (ref 22–32)
Calcium: 9.1 mg/dL (ref 8.9–10.3)
Chloride: 102 mmol/L (ref 98–111)
Creatinine, Ser: 1.25 mg/dL — ABNORMAL HIGH (ref 0.44–1.00)
GFR, Estimated: 44 mL/min — ABNORMAL LOW (ref >60)
Glucose, Bld: 161 mg/dL — ABNORMAL HIGH (ref 70–99)
Potassium: 4.3 mmol/L (ref 3.5–5.1)
Sodium: 135 mmol/L (ref 135–145)

## 2021-05-10 LAB — CBC
HCT: 35.1 % — ABNORMAL LOW (ref 36.0–46.0)
Hemoglobin: 11.2 g/dL — ABNORMAL LOW (ref 12.0–15.0)
MCH: 30.7 pg (ref 26.0–34.0)
MCHC: 31.9 g/dL (ref 30.0–36.0)
MCV: 96.2 fL (ref 80.0–100.0)
Platelets: 284 10*3/uL (ref 150–400)
RBC: 3.65 MIL/uL — ABNORMAL LOW (ref 3.87–5.11)
RDW: 12.9 % (ref 11.5–15.5)
WBC: 7.2 10*3/uL (ref 4.0–10.5)
nRBC: 0 % (ref 0.0–0.2)

## 2021-05-10 LAB — URINALYSIS, ROUTINE W REFLEX MICROSCOPIC
Bilirubin Urine: NEGATIVE
Glucose, UA: NEGATIVE mg/dL
Hgb urine dipstick: NEGATIVE
Ketones, ur: NEGATIVE mg/dL
Leukocytes,Ua: NEGATIVE
Nitrite: NEGATIVE
Protein, ur: NEGATIVE mg/dL
Specific Gravity, Urine: 1.006 (ref 1.005–1.030)
pH: 7 (ref 5.0–8.0)

## 2021-05-10 LAB — CBG MONITORING, ED
Glucose-Capillary: 80 mg/dL (ref 70–99)
Glucose-Capillary: 141 mg/dL — ABNORMAL HIGH (ref 70–99)
Glucose-Capillary: 169 mg/dL — ABNORMAL HIGH (ref 70–99)

## 2021-05-10 MED ORDER — RAMIPRIL 5 MG PO CAPS
10.0000 mg | ORAL_CAPSULE | Freq: Every day | ORAL | Status: DC
  Administered 2021-05-10: 10 mg via ORAL
  Filled 2021-05-10: qty 2

## 2021-05-10 MED ORDER — SODIUM CHLORIDE 0.9% FLUSH: 3.0000 mL | Freq: Two times a day (BID) | INTRAVENOUS | Status: DC

## 2021-05-10 MED ORDER — HYDROCHLOROTHIAZIDE 25 MG PO TABS
25.0000 mg | ORAL_TABLET | Freq: Every day | ORAL | Status: DC
  Administered 2021-05-10: 25 mg via ORAL
  Filled 2021-05-10: qty 1

## 2021-05-10 MED ORDER — SODIUM CHLORIDE 0.9% FLUSH: 3.0000 mL | INTRAVENOUS | Status: DC | PRN

## 2021-05-10 NOTE — Discharge Instructions (Addendum)
Please make sure someone else is checking and verifying Beth [name]'s blood sugar levels at night before she takes her insulin.  It is possible that she took too much insulin last night, and had an episode of low blood sugar this morning.  This can often mimic stroke symptoms.  If you ever see her show these signs or symptoms again (for example, slurred speech, lethargy, weakness of 1 side, or other stroke symptoms), please try to give her some juice to drink.  Keep some sugar beverages present at the bedside.  If she does not get better afterwards (within 1-5 minutes), or if she cannot swallow, call 911 to bring her to the ER.  Likewise, if [name] ever shows signs or symptoms of a stroke, including the symptoms noted above, you should call 911 immediately.  Please also talk to her primary care provider about the high blood pressure issues at home.  She can buy a diary and a blood pressure cuff and keep a daily log of her blood pressures.  *  Ultimately your blood sugar and insulin should be managed by your primary care provider.  Please contact their office today or tomorrow to confirm your insulin dosing.  Check your blood sugar in the evening.  If your blood sugar is less than 80, do not take evening insulin.  If your blood sugar is between 80-160, take 12 units.  If your blood sugar is over 160, take 15 units.  This plan should be confirmed with your PCP as soon as possible.

## 2021-05-10 NOTE — ED Provider Notes (Signed)
[facility] EMERGENCY DEPARTMENT Provider Note   CSN: [id number] Arrival date & time: 05/10/21  1116     History Chief Complaint  Patient presents with   Hypoglycemia   Weakness    AMICA HARRON is a 80 y.o. female present emerged department episode of hypoglycemia and confusion.  His daughter whom she lives is reports the patient takes insulin at night.  Per medical record she takes [place] up to 15 units at night, although the patient reports this may be on a sliding scale based on her sugars.  She does not recall how many units she took last night or what her blood sugar was.  This morning the family went awake around 10 AM and found that she was still sleeping in bed, which is unusual.  She was also very sluggish and lethargic.  They thought they noted slurred speech and a facial droop.  The patient was having difficulty standing up from bed.  EMS arrived and noted that her blood sugar in the field was 48 initially.  They gave her some honey and her sugar improved to 70.  The patient symptoms gradually improved with the blood sugar improvement, and have completely resolved by the time she arrived in the ED.  The patient denies any headache, lightheadedness, numbness or weakness.  She is here with her daughter at the bedside.  They have not had similar episodes in the past.  She denies any history of TIA or stroke, denies any history of smoking.    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 ; reports hasnt had any affliction in years    Wears glasses    Wears partial dentures    top and bottom partials     Patient Active Problem List   Diagnosis Date Noted   Spinal stenosis, 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   Overweight (BMI 25.0-29.9) 04/15/2018   Essential hypertension 04/15/2018   Cervical spine pain 03/22/2018   Lumbar pain 03/22/2018   Mass 12/23/2017   Hyperlipidemia 06/14/2017   Nail deformity 07/30/2016   Type 2 diabetes mellitus with hyperglycemia, without long-term current use of insulin ([place]) 04/13/2016    Past Surgical History:  Procedure Laterality Date   ABDOMINAL HYSTERECTOMY     APPENDECTOMY     CATARACT EXTRACTION, BILATERAL     COLONOSCOPY     many   COMBINED MEDIASTINOSCOPY AND BRONCHOSCOPY  2002   biopsy   DG ARTHRO THUMB*R*  2011   ablasion rt thumb nailbed   FORAMINOTOMY 1 LEVEL Bilateral 05/09/2018   Procedure: Foraminotomy L4-L5 Root;  Surgeon: [name], MD;  Location: [facility];  Service: Orthopedics;  Laterality: Bilateral;   I & D EXTREMITY Right 07/23/2013   Procedure: IRRIGATION AND DEBRIDEMENT DIP JOINT RIGHT INDEX FINGER, EXCISE MUCOID CYST RIGHT INDEX FINGER  ;  Surgeon: Cammie Sickle., MD;  Location: [place];  Service: Orthopedics;  Laterality: Right;   LIPOMA EXCISION     LUMBAR LAMINECTOMY/DECOMPRESSION MICRODISCECTOMY  N/A 05/09/2018   Procedure: Complete decompressive lumbar laminectomy L4-L5;  Surgeon: [name], MD;  Location: [facility];  Service: Orthopedics;  Laterality: N/A;  122min   MASS EXCISION Right 09/28/2014   Procedure: EXCISION MASS RIGHT MIDDLE FINGER;  Surgeon: Daryll Brod, MD;  Location: [place];  Service: Orthopedics;  Laterality: Right;  ANESTHESIA:  GENERAL, IV REGIONAL FAB   MASS EXCISION Bilateral 12/24/2017   Procedure: EXCISION MASS RIGHT SMALL, LEFT INDEX, excision left nail horn middle finger;  Surgeon: Daryll Brod, MD;  Location: [place];  Service: Orthopedics;  Laterality: Bilateral;  FAB   NAILBED REPAIR Left 08/28/2016   Procedure:  Ablation nail matrix left index finger andl left middle finger;  Surgeon: Daryll Brod, MD;  Location: [place];  Service: Orthopedics;  Laterality: Left;   PARTIAL COLECTOMY  1998   cancer-rad/chemo   SKIN FULL THICKNESS GRAFT Right 07/23/2013   Procedure: NAIL PLATE ABLATION WITH FULL THICKNESS SKIN GRAFT FROM RIGHT   MEDIAL ELBOW TO RIGHT LONG FINGER NAIL BED;  Surgeon: Cammie Sickle., MD;  Location: [place];  Service: Orthopedics;  Laterality: Right;   SKIN FULL THICKNESS GRAFT Left 08/28/2016   Procedure: SKIN GRAFT FULL THICKNESS upper left arm;  Surgeon: Daryll Brod, MD;  Location: [place];  Service: Orthopedics;  Laterality: Left;     OB History   No obstetric history on file.     Family History  Problem Relation Age of Onset   Cancer Mother        colon    Hypertension Father    CVA Father    Obesity Brother     Social History   Tobacco Use   Smoking status: Former    Packs/day: 0.50    Types: Cigarettes    Quit date: 07/22/1971    Years since quitting: 49.8   Smokeless tobacco: Never  Vaping Use   Vaping Use: Never used  Substance Use Topics   Alcohol use: No    Alcohol/week: 0.0 standard drinks   Drug use: No    Home Medications Prior to Admission medications   Medication Sig Start Date End Date Taking? Authorizing Provider  acetaminophen (TYLENOL) 325 MG tablet Take 325-650 mg by mouth 2 (two) times daily as needed for moderate pain.     [provider]  diphenhydramine-acetaminophen (TYLENOL PM) 25-500 MG TABS tablet Take 2 tablets by mouth at bedtime as needed (sleep).     [provider]  DROPLET PEN NEEDLES 32G X 5 MM MISC USE TO INJECT INSULIN ONE TIME DAILY AS DIRECTED (NEED APPOINTMENT FOR FURTHER REFILLS) 04/10/21   Philemon Kingdom, MD  glipiZIDE (GLUCOTROL XL) 10 MG 24 hr tablet TAKE 2 TABLETs in am and 1 tablet before dinner 01/31/21   Philemon Kingdom, MD  glucose blood test  strip 1 each by Other route daily. Use as instructed    [provider]  hydrochlorothiazide (HYDRODIURIL) 25 MG tablet Take 1 tablet (25 mg total) by mouth daily. 09/22/16   Tanna Furry, MD  insulin degludec (TRESIBA FLEXTOUCH) 100 UNIT/ML FlexTouch Pen INJECT 15 UNITS SUBCUTANEOUSLY DAILY 01/31/21   Philemon Kingdom, MD  loratadine (CLARITIN) 10 MG tablet Take 10 mg by mouth daily as needed for allergies.    [provider]  lovastatin (MEVACOR) 10 MG tablet Take 10 mg by mouth every evening.     [provider]  metFORMIN (GLUCOPHAGE) 1000 MG tablet TAKE 1 TABLET  TWICE DAILY WITH MEALS 01/31/21   Philemon Kingdom, MD  Multiple Vitamin (MULTIVITAMIN) tablet Take 1 tablet by mouth daily.    [provider]  naproxen (NAPROSYN) 500 MG tablet Take 1 tablet (500 mg total) by mouth 2 (two) times daily with a meal. 04/08/21   Noemi Chapel, MD  neomycin-polymyxin-hydrocortisone (CORTISPORIN) OTIC solution Apply 1-2 drops to toe BID after soaking 07/01/19   Wallene Huh, DPM  ramipril (ALTACE) 10 MG capsule Take 10 mg by mouth daily.    [provider]  Tetrahydrozoline HCl (VISINE OP) Place 1 drop into both eyes daily as needed (irritation).    [provider]    Allergies    Canagliflozin and Liraglutide  Review of Systems   Review of Systems  Constitutional:  Negative for chills and fever.  HENT:  Negative for ear pain and sore throat.   Eyes:  Negative for pain and visual disturbance.  Respiratory:  Negative for cough and shortness of breath.   Cardiovascular:  Negative for chest pain and palpitations.  Gastrointestinal:  Negative for abdominal pain and vomiting.  Genitourinary:  Negative for dysuria and hematuria.  Musculoskeletal:  Negative for arthralgias and back pain.  Skin:  Negative for color change and rash.  Neurological:  Negative for seizures and syncope.  All other systems reviewed and are negative.  Physical  Exam Updated Vital Signs BP (!) 170/78 (BP Location: Right Arm)   Pulse 70   Temp 98.7 F (37.1 C) (Oral)   Resp 13   SpO2 100%   Physical Exam Constitutional:      General: She is not in acute distress. HENT:     Head: Normocephalic and atraumatic.  Eyes:     Conjunctiva/sclera: Conjunctivae normal.     Pupils: Pupils are equal, round, and reactive to light.  Cardiovascular:     Rate and Rhythm: Normal rate and regular rhythm.  Pulmonary:     Effort: Pulmonary effort is normal. No respiratory distress.  Abdominal:     General: There is no distension.     Tenderness: There is no abdominal tenderness.  Skin:    General: Skin is warm and dry.  Neurological:     General: No focal deficit present.     Mental Status: She is alert. Mental status is at baseline.     GCS: GCS eye subscore is 4. GCS verbal subscore is 5. GCS motor subscore is 6.     Cranial Nerves: Cranial nerves are intact.     Sensory: Sensation is intact.     Motor: Motor function is intact.     Coordination: Coordination is intact. Finger-Nose-Finger Test normal.  Psychiatric:        Mood and Affect: Mood normal.        Behavior: Behavior normal.    ED Results / Procedures / Treatments   Labs (all labs ordered are listed, but only abnormal results are displayed) Labs Reviewed  URINALYSIS, ROUTINE W REFLEX MICROSCOPIC - Abnormal; Notable for the following components:      Result Value   Color, Urine STRAW (*)    All other components within normal limits  BASIC METABOLIC PANEL - Abnormal; Notable for the following components:   Glucose, Bld 161 (*)    BUN 24 (*)    Creatinine, Ser 1.25 (*)    GFR, Estimated 44 (*)    All other components within normal limits  CBC - Abnormal; Notable for the following components:   RBC 3.65 (*)  Hemoglobin 11.2 (*)    HCT 35.1 (*)    All other components within normal limits  CBG MONITORING, ED - Abnormal; Notable for the following components:   Glucose-Capillary  141 (*)    All other components within normal limits  CBG MONITORING, ED - Abnormal; Notable for the following components:   Glucose-Capillary 169 (*)    All other components within normal limits  CBG MONITORING, ED  CBG MONITORING, ED    EKG EKG Interpretation  Date/Time:  Wednesday May 10 2021 11:26:01 EDT Ventricular Rate:  88 PR Interval:    QRS Duration: 80 QT Interval:  373 QTC Calculation: 452 R Axis:   3 Text Interpretation: Sinus rhythm, motion artifact V1, no STEMI Confirmed by Octaviano Glow [phone]) on 05/10/2021 12:09:03 PM  Radiology No results found.  Procedures Procedures   Medications Ordered in ED Medications  sodium chloride flush (NS) 0.9 % injection 3 mL (0 mLs Intravenous Hold 05/10/21 1226)  sodium chloride flush (NS) 0.9 % injection 3 mL (has no administration in time range)  ramipril (ALTACE) capsule 10 mg (10 mg Oral Given 05/10/21 1315)  hydrochlorothiazide (HYDRODIURIL) tablet 25 mg (25 mg Oral Given 05/10/21 1241)    ED Course  I have reviewed the triage vital signs and the nursing notes.  Pertinent labs & imaging results that were available during my care of the patient were reviewed by me and considered in my medical decision making (see chart for details).  Ddx includes symptomatic hypoglycemia (most likely) vs UTI vs infection vs dehydration vs other  She cannot recall her insulin dosing last night or checking her sugars, and states her husband was not present as he usually is when she checked her evening BS.  I advised her daughter and her to be vigilant about BS monitoring and f/u with their PCP regarding her current diabetes regimen.  I would not make any significant changes to her dosing as she's been on similar dosing for some time, and has been reliably taking it without any harm.  She does not demonstrate signs of significant dementia on my exam, but we discussed the importance of additional family monitoring her BS  treatments.  CVA/TIA less likely given her symptoms resolved with improvement of her hypoglycemia.  She has a benign neuro exam, no stroke symptoms at this time.  Labs reviewed - CBC and BMP are baseline, UA without sign of infection Patient ate and drank here, BS remained stable.  She remained at her normal baseline mental status throughout her stay, as confirmed by her daughter at bedside, who provided supplemental history.  Her daughter requested assistance with neurology referral for evaluation for possible onset of dementia.  A referral was placed in the system.  ECG reviewed here showing no significant changes from prior tracings  BP medications given here.  Clinical Course as of 05/10/21 1732  Wed May 10, 2021  1209 Neuro exam on arrival is completely benign.  Blood sugar is normal.  [MT]  1349 Blood sugar is now normal.  The patient remains asymptomatic.  Awaiting UA results, and anticipate discharge afterwards. [MT]  1528 Blood pressure cuff was exchanged, as her current cuff was too small for her arm.  With the appropriately sized cuff she does remain mildly hypertensive but in a more reasonable range.  Her blood pressure is 158/57 [MT]    Clinical Course User Index [MT] Londin Antone, Carola Rhine, MD    Final Clinical Impression(s) / ED Diagnoses Final diagnoses:  Hypoglycemia  Hypertension, unspecified type    Rx / DC Orders ED Discharge Orders          Ordered    Ambulatory referral to Neurology       Comments: An appointment is requested in approximately:  Evaluate for possible new onset dementia   05/10/21 1419             Wyvonnia Dusky, MD 05/10/21 1734

## 2021-05-10 NOTE — ED Triage Notes (Signed)
Pt arrives via EMS from home with slurred speech, right sided arm and leg weakness, right facial droop. Bp 220/110. CBG 48. Pt received 4 tablespoons honey recheck 39. Stroke symptoms persisted with recheck CBG. ON arrival to ED cbg 109. Per EMS patients speech, weakness, and facial droop have all resolved. LSN 2300. 20g LAC.

## 2021-05-15 DIAGNOSIS — F418 Other specified anxiety disorders: Secondary | ICD-10-CM | POA: Diagnosis not present

## 2021-05-15 DIAGNOSIS — E119 Type 2 diabetes mellitus without complications: Secondary | ICD-10-CM | POA: Diagnosis not present

## 2021-05-15 DIAGNOSIS — I10 Essential (primary) hypertension: Secondary | ICD-10-CM | POA: Diagnosis not present

## 2021-05-16 ENCOUNTER — Encounter: Payer: Self-pay | Admitting: Internal Medicine

## 2021-05-16 ENCOUNTER — Ambulatory Visit (INDEPENDENT_AMBULATORY_CARE_PROVIDER_SITE_OTHER): Payer: Medicare Other | Admitting: Internal Medicine

## 2021-05-16 ENCOUNTER — Other Ambulatory Visit: Payer: Self-pay

## 2021-05-16 ENCOUNTER — Ambulatory Visit
Admission: RE | Admit: 2021-05-16 | Discharge: 2021-05-16 | Disposition: A | Discharge status: Home / Self Care | Payer: Medicare Other | Source: Ambulatory Visit | Attending: Family Medicine | Admitting: Family Medicine

## 2021-05-16 ENCOUNTER — Other Ambulatory Visit (HOSPITAL_COMMUNITY)
Admission: RE | Admit: 2021-05-16 | Discharge: 2021-05-16 | Disposition: A | Discharge status: Home / Self Care | Payer: Medicare Other | Source: Ambulatory Visit | Attending: Radiology | Admitting: Radiology

## 2021-05-16 DIAGNOSIS — R918 Other nonspecific abnormal finding of lung field: Secondary | ICD-10-CM

## 2021-05-16 DIAGNOSIS — E041 Nontoxic single thyroid nodule: Secondary | ICD-10-CM

## 2021-05-16 DIAGNOSIS — C189 Malignant neoplasm of colon, unspecified: Secondary | ICD-10-CM

## 2021-05-16 LAB — SEDIMENTATION RATE: Sed Rate: 25 mm/hr (ref 0–30)

## 2021-05-16 NOTE — Assessment & Plan Note (Signed)
Chest CT (no comparisons or even prior cxr's to compare)  04/08/21 biapical cavitary nodules  - sarcoid med adenopathy 2002 - Labs ordered 05/16/2021  :  Quant TB, CEA, Histo/crypto Ag - ESR 05/16/2021  - PET ordered  This would be a very unusual presentation for sarcoidosis and I'm concerned about one or both nodules proving to be malignant instead so rec PET then consider Navigational bx/ EBUS if indicated   Discussed in detail all the  indications, usual  risks and alternatives  relative to the benefits with patient who agrees to proceed with w/u as outlined.            Each maintenance medication was reviewed in detail including emphasizing most importantly the difference between maintenance and prns and under what circumstances the prns are to be triggered using an action plan format where appropriate.  Total time for H and P, chart review, counseling,  and generating customized AVS unique to this new pt office visit / same day charting = 48 min

## 2021-05-16 NOTE — Progress Notes (Signed)
Beth [name], female    DOB: 1940-11-13,    MRN: [id number]   Brief patient profile:  17 yowf quit smoking 1972 with remote h/o colon ca and mediastinal adenopathy dx as sarcodosis   2002 by Arlyce Dice  but apparently didn't required any prednisone referred to pulmonary clinic 05/16/2021 by Dr  Noemi Chapel for abnormal CT with bilateral apical cavitary nodules      History of Present Illness  05/16/2021  Pulmonary/ 1st office eval/Adley Mazurowski  Chief Complaint  Patient presents with   Consult    Patient daughter reports that she had a CT with abnormal nodules.  She had been told in the past that she had sarcoidosis.   Dyspnea:  more limited by othopedic issues but able to walk neighborhood with cane including hills up to 20 min with cane does not check sats  Cough: none  Sleep: flat bed/ one pillow SABA use: none  Hoarseness x years, worse in am's (on ACEi)   No obvious day to day or daytime variability or assoc excess/ purulent sputum or mucus plugs or hemoptysis or cp or chest tightness, subjective wheeze or overt sinus or hb symptoms.   Sleeping  without nocturnal  or early am exacerbation  of respiratory  c/o's or need for noct saba. Also denies any obvious fluctuation of symptoms with weather or environmental changes or other aggravating or alleviating factors except as outlined above   No unusual exposure hx or h/o childhood pna/ asthma or knowledge of premature birth.  Current Allergies, Complete Past Medical History, Past Surgical History, Family History, and Social History were reviewed in [employer] record.  ROS  The following are not active complaints unless bolded Hoarseness, sore throat, dysphagia, dental problems, itching, sneezing,  nasal congestion or discharge of excess mucus or purulent secretions, ear ache,   fever, chills, sweats, unintended wt loss or wt gain, classically pleuritic or exertional cp,  orthopnea pnd or arm/hand swelling  or leg  swelling, presyncope, palpitations, abdominal pain, anorexia, nausea, vomiting, diarrhea  or change in bowel habits or change in bladder habits, change in stools or change in urine, dysuria, hematuria,  rash, arthralgias, visual complaints, headache, numbness, weakness or ataxia or problems with walking or coordination,  change in mood or  memory.             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      DROPLET PEN NEEDLES 32G X 5 MM MISC USE TO INJECT INSULIN ONE TIME DAILY AS DIRECTED (NEED APPOINTMENT FOR FURTHER REFILLS) 100 each 2   glipiZIDE (GLUCOTROL XL) 10 MG 24 hr tablet TAKE 2 TABLETs in am and 1 tablet  before dinner 270 tablet 3   glucose blood test strip 1 each by Other route daily. Use as instructed     insulin degludec (TRESIBA FLEXTOUCH) 100 UNIT/ML FlexTouch Pen INJECT 15 UNITS SUBCUTANEOUSLY DAILY 15 mL 3   lisinopril (ZESTRIL) 10 MG tablet Take 10 mg by mouth daily.     loratadine (CLARITIN) 10 MG tablet Take 10 mg by mouth daily as needed for allergies.     lovastatin (MEVACOR) 10 MG tablet Take 10 mg by mouth every evening.      metFORMIN (GLUCOPHAGE) 1000 MG tablet TAKE 1 TABLET TWICE DAILY WITH MEALS 180 tablet 3   Multiple Vitamin (MULTIVITAMIN) tablet Take 1 tablet by mouth daily.     naproxen (NAPROSYN) 500 MG tablet  Take 1 tablet (500 mg total) by mouth 2 (two) times daily with a meal. 30 tablet 0   neomycin-polymyxin-hydrocortisone (CORTISPORIN) OTIC solution Apply 1-2 drops to toe BID after soaking 10 mL 1   ramipril (ALTACE) 10 MG capsule Take 10 mg by mouth daily.     Tetrahydrozoline HCl (VISINE OP) Place 1 drop into both eyes daily as needed (irritation).     diphenhydramine-acetaminophen (TYLENOL PM) 25-500 MG TABS tablet Take 2 tablets by mouth at bedtime as needed (sleep).      Acetaminophen-Codeine 300-30 MG tablet acetaminophen 300 mg-codeine 30 mg tablet  TAKE 1 TABLET BY MOUTH EVERY 6 HOURS AS NEEDED FOR PAIN     amoxicillin (AMOXIL) 500 MG capsule amoxicillin 500 mg capsule  TAKE 1 CAPSULE BY MOUTH 4 TIMES DAILY     cephALEXin (KEFLEX) 500 MG capsule cephalexin 500 mg capsule     hydrochlorothiazide (HYDRODIURIL) 25 MG tablet Take 1 tablet (25 mg total) by mouth daily. (Patient not taking: Reported on 05/16/2021) 30 tablet 0   No facility-administered medications prior to visit.     Objective:     BP 140/80 (BP Location: Left Arm, Patient Position: Sitting, Cuff Size: Normal)   Pulse 90   Temp 98.1 F (36.7 C) (Oral)   Ht _0  (1.651 m)   Wt 155 lb 3.2 oz (70.4 kg)   SpO2 100% Comment: ra  BMI 25.83 kg/m   SpO2: 100 % (ra)  Wt Readings from Last 3 Encounters:  05/16/21 155 lb 3.2 oz (70.4 kg)  04/08/21 158 lb (71.7 kg)  01/31/21 156 lb 9.6 oz (71 kg)     Amb somber wf lets her daughter answer all the questions    HEENT : pt wearing mask not removed for exam due to covid -19 concerns.    NECK :  without JVD/Nodes/TM/ nl carotid upstrokes bilaterally   LUNGS: no acc muscle use,  Nl contour chest which is clear to A and P bilaterally without cough on insp or exp maneuvers   CV:  RRR  no s3 or murmur or increase in P2, and no edema   ABD:  soft and nontender with nl inspiratory excursion in the supine position. No bruits or organomegaly appreciated, bowel sounds  nl  MS:  Nl gait/ ext warm without deformities, calf tenderness, cyanosis or clubbing No obvious joint restrictions   SKIN: warm and dry without lesions    NEURO:  alert, approp, nl sensorium with  no motor or cerebellar deficits apparent.     I personally reviewed images and agree with radiology impression as follows:   Chest CT (no comparisons or even prior cxr's to compare)  04/08/21 1. Thick walled cavitary lesions with surrounding spiculation in  the bilateral lung apices slightly larger on the right than the left. Differential considerations include synchronous upper lobe primary bronchogenic carcinoma versus an active infectious/inflammatory process. Recommend non emergent referral to pulmonology for further evaluation. 2. Globular and enlarged right thyroid gland may reflect underlying goiter or thyroid nodule. Recommend dedicated thyroid ultrasound (non emergent) for further evaluation. 3. Nonspecific mediastinal lymph nodes are mildly prominent but not technically enlarged by imaging criteria. These may be reactive, or metastatic depending on the pathology of the upper lobe lesions.  Labs ordered 05/16/2021  :  Quant TB, CEA, Histo/crypto Ag     Assessment   Multiple lung nodules on CT  Chest CT (no comparisons or even prior cxr's to compare)  04/08/21 biapical cavitary nodules  - sarcoid med adenopathy 2002 - Labs ordered 05/16/2021  :  Quant TB, CEA, Histo/crypto Ag - ESR 05/16/2021  - PET ordered  This would be a very unusual presentation for sarcoidosis and I'm concerned about one or both nodules proving to be malignant instead so rec PET then consider Navigational bx/ EBUS if indicated   Discussed in detail all the  indications, usual  risks and alternatives  relative to the benefits with patient who agrees to proceed with w/u as outlined.            Each maintenance medication was reviewed in detail including emphasizing most importantly the difference between  maintenance and prns and under what circumstances the prns are to be triggered using an action plan format where appropriate.  Total time for H and P, chart review, counseling,  and generating customized AVS unique to this new pt office visit / same day charting = 48 min           Christinia Gully, MD 05/16/2021

## 2021-05-16 NOTE — Patient Instructions (Signed)
We will call to schedule you a PET scan and call you the results  Please remember to go to the lab department   for your tests - we will call you with the results when they are available.      No pulmonary follow up needed at this point

## 2021-05-17 ENCOUNTER — Encounter: Payer: Self-pay | Admitting: Neurology

## 2021-05-17 ENCOUNTER — Ambulatory Visit (INDEPENDENT_AMBULATORY_CARE_PROVIDER_SITE_OTHER): Payer: Medicare Other | Admitting: Neurology

## 2021-05-17 VITALS — BP 166/65 | HR 85 | Ht 61.0 in | Wt 153.0 lb

## 2021-05-17 DIAGNOSIS — R413 Other amnesia: Secondary | ICD-10-CM | POA: Diagnosis not present

## 2021-05-17 LAB — CYTOLOGY - NON PAP

## 2021-05-17 NOTE — Progress Notes (Addendum)
Subjective:    Patient ID: Beth [name] is a 80 y.o. female.  HPI    Star Age, MD, PhD [facility] [address], [address] [place], [place] [address]  I saw patient, Beth [name], as a referral from the ER for memory loss, concern for dementia.  The patient is accompanied by her daughter, Beth [name], today.  Ms. Kozel is a 80 year old right-handed woman with an underlying medical history of arthritis, colon cancer, chronic back pain, diabetes, Raynaud's syndrome, hypertension, hyperlipidemia, sarcoidosis, psoriasis, and borderline overweight state, who presented to the emergency room on 05/10/2021 via EMS for hypoglycemia and confusion.  Patient was noted by family to have slurring of speech and facial droop.  I reviewed the emergency room records.  Initially her blood sugar in the field was 48, and improved to 70.  Chemistry panel showed glucose of 161, BUN 24, creatinine 1.25, CBC showed RBC of 3.65, hemoglobin 11.2, hematocrit 35.1.  She was felt to have hyperglycemia versus UTI versus infection versus dehydration.    Daughter provides details of her history.  She reports that her patient has had forgetfulness and lapses in her memory for the past 1 year approximately.  No family history of dementia.  Patient lives with her husband.  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  Daughter lives about 18 minutes away.  Patient also has 2 grown sons.  She had a fall and injured her right foot.  She has seen orthopedics for this.  She walks with a cane for safety.  She tries to hydrate well.  She has done better lately.  She had an older brother who died in his 14s, no memory issues.  Her sister died in infancy.  Patient has seen primary care recently this week.  She had a thyroid biopsy and is pending a PET scan next month.  She has had blood work through her primary care and has a history of Raynaud's.  Currently not sure if she is followed by a  rheumatologist.  She sleeps fairly well, denies any snoring.  She quit smoking some 50 years ago.  She does not drink alcohol.  She was treated recently for UTI and after she finished antibiotics, her daughter feels that her cognitive clarity is better.  She had a head CT and cervical spine CT without contrast on 04/08/2021 with indication of head and neck trauma, altered mental status, I reviewed the results: 1. No acute fracture or malalignment. 2. 2.4 cm low-attenuation cyst/nodule in the right thyroid gland. Recommend dedicated thyroid ultrasound for further evaluation. 3. 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 4. Mild grade 1 anterolisthesis of C3 on C4 is almost certainly degenerative in nature. 5. Mild bilateral facet arthropathy.   She presented to the emergency room on 04/08/2021 after a fall.  She presented via EMS.  She saw pulmonology on 05/16/2021 and a PET scan has been ordered for concern for pulmonary malignancy.  Addendum, 06/22/21: I received records from patient's primary care office, [facility] physicians.  She saw Loura Halt, NP on 05/15/2021 and I reviewed the note: Anxiety about health was discussed.  Patient was given a prescription for Xanax 0.25 mg strength once daily as needed.  She had an appointment for BP check on 05/11/2021 with the nurse practitioner.  She had an office visit with the nurse practitioner on 04/17/2021.  She had an office visit with a nurse practitioner on 04/12/2021 for a hospital  follow-up.  She had fallen and had low blood sugar.  She was on antibiotics for a UTI.  She had blood work at the time, on 04/12/2021 and I reviewed the results: Lipid profile showed total cholesterol of 114, triglycerides 178, HDL 43, LDL 42.  CMP showed blood sugar of 111, BUN 27, creatinine 1.23, otherwise normal.  CBC with differential showed low hemoglobin at 11.6 and hematocrit mildly low at 34.7.  I reviewed the office note from 04/07/2020, at which time  she saw Dr. Rex Kras.  She had blood work at the time and I reviewed the results: TSH was mildly elevated at 4.76, CMP fairly benign, hemoglobin was 12.5 and hematocrit 36.3.  Lipid profile benign.  Additional office records date back to 03/25/2021, 04/24/2018 at which time she requested surgical clearance for her lumbar surgery with Dr. Gladstone Lighter.  Other records from the office are dating back further.  She had a fine-needle thyroid biopsy on 05/16/2021.  Records also include an ophthalmology note from Dr. Rutherford Guys from 03/06/2021, at which time she had a diabetic eye exam.  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 ; reports hasnt had any affliction in years    Wears glasses    Wears partial dentures    top and bottom partials    Her Past Surgical History Is Significant For: Past Surgical History:  Procedure Laterality Date   ABDOMINAL HYSTERECTOMY     APPENDECTOMY     CATARACT EXTRACTION, BILATERAL     COLONOSCOPY     many   COMBINED MEDIASTINOSCOPY AND BRONCHOSCOPY  2002   biopsy   DG ARTHRO THUMB*R*  2011   ablasion rt thumb nailbed   FORAMINOTOMY 1 LEVEL Bilateral 05/09/2018   Procedure: Foraminotomy L4-L5 Root;  Surgeon: [name], MD;  Location: [facility];  Service: Orthopedics;  Laterality: Bilateral;   I & D EXTREMITY Right 07/23/2013   Procedure: IRRIGATION AND DEBRIDEMENT DIP JOINT RIGHT INDEX FINGER, EXCISE MUCOID CYST RIGHT INDEX FINGER  ;  Surgeon: Cammie Sickle., MD;  Location: [facility];  Service: Orthopedics;  Laterality: Right;   LIPOMA EXCISION     LUMBAR  LAMINECTOMY/DECOMPRESSION MICRODISCECTOMY N/A 05/09/2018   Procedure: Complete decompressive lumbar laminectomy L4-L5;  Surgeon: [name], MD;  Location: [facility];  Service: Orthopedics;  Laterality: N/A;  188min   MASS EXCISION Right 09/28/2014   Procedure: EXCISION MASS RIGHT MIDDLE FINGER;  Surgeon: Daryll Brod, MD;  Location: [facility];  Service: Orthopedics;  Laterality: Right;  ANESTHESIA:  GENERAL, IV REGIONAL FAB   MASS EXCISION Bilateral 12/24/2017   Procedure: EXCISION MASS RIGHT SMALL, LEFT INDEX, excision left nail horn middle finger;  Surgeon: Daryll Brod, MD;  Location: [facility];  Service: Orthopedics;  Laterality: Bilateral;  FAB   NAILBED REPAIR Left 08/28/2016   Procedure: Ablation nail matrix left index finger andl left middle finger;  Surgeon: Daryll Brod, MD;  Location: [facility];  Service: Orthopedics;  Laterality:  Left;   PARTIAL COLECTOMY  1998   cancer-rad/chemo   SKIN FULL THICKNESS GRAFT Right 07/23/2013   Procedure: NAIL PLATE ABLATION WITH FULL THICKNESS SKIN GRAFT FROM RIGHT   MEDIAL ELBOW TO RIGHT LONG FINGER NAIL BED;  Surgeon: Cammie Sickle., MD;  Location: [facility];  Service: Orthopedics;  Laterality: Right;   SKIN FULL THICKNESS GRAFT Left 08/28/2016   Procedure: SKIN GRAFT FULL THICKNESS upper left arm;  Surgeon: Daryll Brod, MD;  Location: [facility];  Service: Orthopedics;  Laterality: Left;    Her Family History Is Significant For: Family History  Problem Relation Age of Onset   Cancer Mother        colon    Hypertension Father    CVA Father    Obesity Brother     Her Social History Is Significant For: Social History   Socioeconomic History   Marital status: Married    Spouse name: Not on file   Number of children: Not on file   Years of education: Not on file   Highest education level: Not on file  Occupational History   Not on file  Tobacco Use   Smoking  status: Former    Packs/day: 0.50    Types: Cigarettes    Quit date: 07/22/1971    Years since quitting: 49.8   Smokeless tobacco: Never  Vaping Use   Vaping Use: Never used  Substance and Sexual Activity   Alcohol use: No    Alcohol/week: 0.0 standard drinks   Drug use: No   Sexual activity: Yes  Other Topics Concern   Not on file  Social History Narrative   Married   3 children   Social Determinants of Health   Financial Resource Strain: Not on file  Food Insecurity: Not on file  Transportation Needs: Not on file  Physical Activity: Not on file  Stress: Not on file  Social Connections: Not on file    Her Allergies Are:  No Active Allergies:   Her Current Medications Are:  Outpatient Encounter Medications as of 05/17/2021  Medication Sig   acetaminophen (TYLENOL) 325 MG tablet Take 325-650 mg by mouth 2 (two) times daily as needed for moderate pain.    DROPLET PEN NEEDLES 32G X 5 MM MISC USE TO INJECT INSULIN ONE TIME DAILY AS DIRECTED (NEED APPOINTMENT FOR FURTHER REFILLS)   glipiZIDE (GLUCOTROL XL) 10 MG 24 hr tablet TAKE 2 TABLETs in am and 1 tablet before dinner (Patient taking differently: Take by mouth daily with breakfast. 1 in am)   glucose blood test strip 1 each by Other route daily. Use as instructed   insulin degludec (TRESIBA FLEXTOUCH) 100 UNIT/ML FlexTouch Pen INJECT 15 UNITS SUBCUTANEOUSLY DAILY   lisinopril (ZESTRIL) 10 MG tablet Take 10 mg by mouth daily.   loratadine (CLARITIN) 10 MG tablet Take 10 mg by mouth daily as needed for allergies.   lovastatin (MEVACOR) 10 MG tablet Take 10 mg by mouth every evening.    metFORMIN (GLUCOPHAGE) 1000 MG tablet TAKE 1 TABLET TWICE DAILY WITH MEALS   Multiple Vitamin (MULTIVITAMIN) tablet Take 1 tablet by mouth daily.   naproxen (NAPROSYN) 500 MG tablet Take 1 tablet (500 mg total) by mouth 2 (two) times daily with a meal.   neomycin-polymyxin-hydrocortisone (CORTISPORIN) OTIC solution Apply 1-2 drops to toe BID  after soaking   ramipril (ALTACE) 10 MG capsule Take 10 mg by mouth daily.   Tetrahydrozoline HCl (VISINE OP) Place 1 drop  into both eyes daily as needed (irritation).   No facility-administered encounter medications on file as of 05/17/2021.  :   Review of Systems:  Out of a complete 14 point review of systems, all are reviewed and negative with the exception of these symptoms as listed below:  Review of Systems  Neurological:        Pt is here today for memory. Pt daughter states  short term its not good. . Pt fell June had a UTI . Pt states after she had the antibiotic  for the UTI there was a little less fogginess.    Objective:  Neurological Exam  Physical Exam Physical Examination:   Vitals:   05/17/21 1310  BP: (!) 166/65  Pulse: 85   General Examination: The patient is a very pleasant 80 y.o. female in no acute distress. She appears well-developed and well-nourished and well groomed.   HEENT: Normocephalic, atraumatic, pupils are equal, round and reactive to light, extraocular tracking is well-preserved, she has bilateral cataract extractions.  Face is symmetric with normal facial animation.  Hearing is grossly intact.  Airway examination reveals mild to moderate mouth dryness.  Tongue protrudes centrally and palate elevates symmetrically.  No carotid bruits.  Neck is supple, fairly good range of motion throughout.  Mild bruising right anterior neck from thyroid biopsy.  Chest: Clear to auscultation without wheezing, rhonchi or crackles noted.  Heart: S1+S2+0, regular and normal without murmurs, rubs or gallops noted.   Abdomen: Soft, non-tender and non-distended with normal bowel sounds appreciated on auscultation.  Extremities: There is no pitting edema in the distal lower extremities bilaterally. Pedal pulses difficult to palpate.  Significant discoloration both feet including toes.  Mild swelling right forefoot.  Skin: Warm and dry without trophic changes  noted.  Musculoskeletal: exam reveals no obvious joint deformities with the exception of right foot swelling.   Neurologically:  Mental status: The patient is awake, alert and oriented in all 4 spheres. Her immediate and remote memory, attention, language skills and fund of knowledge are impaired.  She is able to give portions of her history but not details.  There is no evidence of aphasia, agnosia, apraxia or anomia. Speech is clear with normal prosody and enunciation. Thought process is linear. Mood is normal and affect is normal.  MMSE - Mini Mental State Exam 05/17/2021 05/17/2021  Orientation to time 0 0  Orientation to Place 2 -  Registration 3 -  Attention/ Calculation 0 -  Recall 0 -  Language- name 2 objects 2 -  Language- repeat 1 -  Language- follow 3 step command 3 -  Language- read & follow direction 1 -  Write a sentence 1 -  Copy design 0 -  Total score 13 -   On 05/17/2021: CDT: 1/4, AFT: 5/min.  Cranial nerves II - XII are as described above under HEENT exam.  Motor exam: Thin bulk, global strength of 4+ out of 5, normal tone.  No drift, no resting or postural tremor.  Romberg was not tested due to safety concerns.  Reflexes are 1+ in the upper extremities, trace in the knees and absent in the ankles.  Toes are downgoing bilaterally.  Fine motor skills are fairly well-preserved with finger taps and hand movements, foot agility is mildly impaired bilaterally.   Cerebellar testing: No dysmetria or intention tremor.  Sensory exam: intact to light touch.  Gait, station and balance: She stands with mild difficulty.  She needs no assistance.  She walks with  a single-point cane.  No shuffling, she has preserved arm swing.  Assessment and Plan:   In summary, AHLEAH SIMKO is a very pleasant 80 y.o.-year old female with an underlying medical history of arthritis, colon cancer, chronic back pain, diabetes, Raynaud's syndrome, hypertension, hyperlipidemia, sarcoidosis, psoriasis,  and borderline overweight state, who presents for evaluation of her memory loss of approximately 1 years duration, she was referred by the emergency room where she presented recently with weakness and hypoglycemia.  She has had recent metabolic issues.  She has had blood pressure fluctuation and was also treated for UTI recently.  All of these can affect her memory function.  She does score in the moderately low range on MMSE testing today but I would like to monitor her symptoms.  She has had some work-up and evaluation through her primary care and we will try to get records.  We will proceed with a brain MRI without contrast.  We talked about the importance of healthy lifestyle, including good nutrition, hydrating well with water, getting enough rest.  She does not have a family history of Alzheimer's dementia.  She does have some vascular risk factors including hypertension, hyperlipidemia and a remote history of smoking.  I am not sure if she is followed by rheumatology but they are encouraged to seek further evaluation for her foot discoloration. She is scheduled for a PET scan early in September.  We will plan a brain MRI and call them of the results and plan a follow-up afterwards.  This was an extended visit including record review and memory test result review. I answered all the questions today and the patient and her daughter were in agreement.  Star Age, MD, PhD

## 2021-05-17 NOTE — Patient Instructions (Addendum)
You have complaints of memory loss: memory loss or changes in cognitive function can have many reasons and does not always mean you have dementia. Conditions that can contribute to subjective or objective memory loss include: depression, stress, poor sleep from insomnia or sleep apnea, dehydration, fluctuation in blood sugar values, thyroid or electrolyte dysfunction and certain vitamin deficiencies. Dementia can be caused by stroke, brain atherosclerosis or brain vascular disease due to vascular risk factors (smoking, high blood pressure, high cholesterol, obesity and uncontrolled diabetes), certain degenerative brain disorders (including Parkinson's disease and Multiple sclerosis) and by Alzheimer's disease or other, more rare and sometimes hereditary causes.   We will request office records and blood test results from your primary care office, Dr. Rex Kras and his PA.  We may need to do additional blood work down the road but for now we can hold off.  You have had recent blood sugar and blood pressure fluctuations and a recent UTI.  All of these can affect your memory function.

## 2021-05-18 ENCOUNTER — Telehealth: Payer: Self-pay | Admitting: *Deleted

## 2021-05-18 ENCOUNTER — Telehealth: Payer: Self-pay | Admitting: Neurology

## 2021-05-18 NOTE — Telephone Encounter (Signed)
Medicare/[employer] supp order sent to GI. NPR they will reach out to the patient to schedule.

## 2021-05-18 NOTE — Telephone Encounter (Signed)
Request faxed to Dr.Little [phone]

## 2021-05-19 LAB — QUANTIFERON-TB GOLD PLUS
Mitogen-NIL: >10 IU/mL
NIL: 0.03 IU/mL
QuantiFERON-TB Gold Plus: NEGATIVE
TB1-NIL: 0 IU/mL
TB2-NIL: 0 IU/mL

## 2021-05-19 LAB — HISTOPLASMA ANTIGEN (BLD, CSF, BRONCH WASH, OTHER)
Interpretation:: NEGATIVE
RESULT:: NOT DETECTED ng/mL

## 2021-05-19 LAB — CEA: CEA: 3 ng/mL — ABNORMAL HIGH

## 2021-05-23 LAB — CRYPTOCOCCAL ANTIGEN: Cryptococcus Antigen, Serum: NEGATIVE

## 2021-05-25 ENCOUNTER — Telehealth: Payer: Self-pay | Admitting: Internal Medicine

## 2021-05-25 DIAGNOSIS — Z794 Long term (current) use of insulin: Secondary | ICD-10-CM

## 2021-05-25 DIAGNOSIS — E1122 Type 2 diabetes mellitus with diabetic chronic kidney disease: Secondary | ICD-10-CM

## 2021-05-25 DIAGNOSIS — N183 Chronic kidney disease, stage 3 unspecified: Secondary | ICD-10-CM

## 2021-05-25 MED ORDER — ONETOUCH VERIO W/DEVICE KIT: PACK | 0 refills | Status: DC

## 2021-05-25 MED ORDER — ONETOUCH ULTRASOFT LANCETS MISC: 3 refills | Status: DC

## 2021-05-25 MED ORDER — ONETOUCH VERIO VI STRP: ORAL_STRIP | 3 refills | Status: DC

## 2021-05-25 NOTE — Telephone Encounter (Signed)
Patients daughter called to request a blood sugar meter, test strips, & lancets.  Insurance will cover per daughter.  Patient has been buying OTC - daughter said that because of all of the current health concerns patients blood sugars have been extremely erratic and the blood sugar meter they have is not functioning well.  They are testing 6-8 per day in order to try and manage blood sugar better.  Current blood sugar readings ranging between 51 and upper 200's. Is having MRI Sunday, PET scan next week, recovering from UTI, and 3 fractures in foot from syncopal episode so blood sugars are widely erratic   Please call prescriptions in to [facility]  on [address].  For any questions or clarification call Teresa Pelton at [phone]  ** Requesting URGENT RX if at at possible due to health concerns and holiday weekend.

## 2021-05-25 NOTE — Telephone Encounter (Signed)
Sent Rx to preferred pharmacy.

## 2021-05-26 ENCOUNTER — Other Ambulatory Visit: Payer: Self-pay | Admitting: Internal Medicine

## 2021-05-26 DIAGNOSIS — N183 Chronic kidney disease, stage 3 unspecified: Secondary | ICD-10-CM

## 2021-05-26 DIAGNOSIS — E1122 Type 2 diabetes mellitus with diabetic chronic kidney disease: Secondary | ICD-10-CM

## 2021-05-26 DIAGNOSIS — Z794 Long term (current) use of insulin: Secondary | ICD-10-CM

## 2021-05-28 ENCOUNTER — Ambulatory Visit
Admission: RE | Admit: 2021-05-28 | Discharge: 2021-05-28 | Disposition: A | Discharge status: Home / Self Care | Payer: Medicare Other | Source: Ambulatory Visit | Attending: Neurology | Admitting: Neurology

## 2021-05-28 DIAGNOSIS — R413 Other amnesia: Secondary | ICD-10-CM | POA: Diagnosis not present

## 2021-05-31 ENCOUNTER — Other Ambulatory Visit: Payer: Self-pay

## 2021-05-31 ENCOUNTER — Ambulatory Visit (HOSPITAL_COMMUNITY)
Admission: RE | Admit: 2021-05-31 | Discharge: 2021-05-31 | Disposition: A | Discharge status: Home / Self Care | Payer: Medicare Other | Source: Ambulatory Visit | Attending: Internal Medicine | Admitting: Internal Medicine

## 2021-05-31 ENCOUNTER — Telehealth: Payer: Self-pay | Admitting: *Deleted

## 2021-05-31 DIAGNOSIS — Z981 Arthrodesis status: Secondary | ICD-10-CM | POA: Diagnosis not present

## 2021-05-31 DIAGNOSIS — R918 Other nonspecific abnormal finding of lung field: Secondary | ICD-10-CM | POA: Diagnosis not present

## 2021-05-31 DIAGNOSIS — E041 Nontoxic single thyroid nodule: Secondary | ICD-10-CM | POA: Diagnosis not present

## 2021-05-31 DIAGNOSIS — J984 Other disorders of lung: Secondary | ICD-10-CM | POA: Diagnosis not present

## 2021-05-31 LAB — GLUCOSE, CAPILLARY: Glucose-Capillary: 142 mg/dL — ABNORMAL HIGH (ref 70–99)

## 2021-05-31 MED ORDER — FLUDEOXYGLUCOSE F - 18 (FDG) INJECTION
7.6000 | Freq: Once | INTRAVENOUS | Status: AC
  Administered 2021-05-31: 7.6 via INTRAVENOUS

## 2021-05-31 NOTE — Telephone Encounter (Signed)
Spoke with patient's daughter, Tressia Miners (on [place]), and discussed the MRI results per Dr Rexene Alberts.  She verbalized understanding.  She did ask if these are at the beginning stages of dementia.  I let her know I was not able to answer that but I would send a message over to the doctor.  May also need to further discuss at the 70-month follow-up.  Her other questions were answered.  She understands it is important for the patient to pursue lifestyle modification and risk factor reduction so as to try to keep these findings on MRI brain from getting worse.  We went ahead and scheduled a follow-up with Megan NP on 09/20/21 at 9:00 AM.  She verbalized appreciation for the call.

## 2021-05-31 NOTE — Telephone Encounter (Signed)
-----   Message from Star Age, MD sent at 05/30/2021 11:56 AM EDT ----- Please call patient or her daughter on DPR and advised them that her brain MRI did not show any acute findings.  She did have chronic changes including mild to moderate volume loss, which we call atrophy.  She also had moderate changes that are not in keeping with hardening of the arteries, some scattered tiny spots of hemorrhage which can be seen with chronic high blood pressure or with dementia patients.  Overall, no acute findings. It is important to continue to pursue lifestyle modification and risk factor reduction including good blood pressure control, good cholesterol control, good blood sugar control, and weight management.   Please offer follow-up appointment in about 3 months to see one of our nurse practitioners.

## 2021-05-31 NOTE — Telephone Encounter (Signed)
Findings on her brain MRI can be seen in patients with dementia, that is correct.

## 2021-06-01 ENCOUNTER — Telehealth: Payer: Self-pay | Admitting: Internal Medicine

## 2021-06-01 NOTE — Telephone Encounter (Signed)
I called and spoke with patient regarding message. Patient and husband on the phone and went over Dr. Ammie Dalton. They verbalized understanding and would like to go ahead and make appt with Dr. Valeta Harms. Will route to Dr. Valeta Harms and inform patients we will call back with Dr. Oliver Hum.  Dr. Valeta Harms, please advise on appt. Thanks!

## 2021-06-01 NOTE — Telephone Encounter (Signed)
Received a call report from [place] at [facility] for the PET scan the patient had yesterday.   Below is a copy of the impression:   IMPRESSION: 1. Symmetric upper lobe thick-walled hypermetabolic cavitary lesions. Symmetry would favor a infectious or post inflammatory process including fungal infections. Cannot exclude synchronous bronchogenic carcinoma. Consider bronchoscopy for further evaluation. 2. Moderate metabolic activity associated with normal size lower paratracheal lymph nodes is favored reactive. 3. No distant metastatic disease.   These results will be called to the ordering clinician or representative by the Radiologist Assistant, and communication documented in the PACS or [employer].  MW, can you please advise? Thanks!

## 2021-06-01 NOTE — Telephone Encounter (Signed)
ATC, left VM. 

## 2021-06-01 NOTE — Telephone Encounter (Signed)
I called daughter, [name], and relayed per Dr. Rexene Alberts that the findings with the MRI are seen in patients with dementia.  Patient has an appointment scheduled in December for follow-up.  She will monitor her mom.  She has multiple issues going on and will take one day at a time .  Will call back as needed. Appreciated call back.

## 2021-06-01 NOTE — Telephone Encounter (Signed)
Let her know no evidence of lung cancer but we need to set up a lung bx in the not too distant future (w/in the the next 6-8 weeks, no rush) so consult Dr Windell Norfolk  to see if he agrees and can plan it directly with her without me as the "middle man" .   Happy to set up a televisit with her if wants to go over these recs before seeing Dr Windell Norfolk but this is optional.

## 2021-06-02 NOTE — Telephone Encounter (Signed)
Ov with BI 06/15/21 at 10:30

## 2021-06-08 ENCOUNTER — Telehealth: Payer: Self-pay

## 2021-06-08 NOTE — Telephone Encounter (Signed)
Inbound fax requesting forms be completed and faxed. Forms completed and faxed to [place] at [phone]

## 2021-06-13 ENCOUNTER — Emergency Department (HOSPITAL_BASED_OUTPATIENT_CLINIC_OR_DEPARTMENT_OTHER): Payer: Medicare Other

## 2021-06-13 ENCOUNTER — Other Ambulatory Visit: Payer: Self-pay

## 2021-06-13 ENCOUNTER — Encounter (HOSPITAL_BASED_OUTPATIENT_CLINIC_OR_DEPARTMENT_OTHER): Payer: Self-pay | Admitting: Emergency Medicine

## 2021-06-13 ENCOUNTER — Telehealth: Payer: Self-pay | Admitting: Internal Medicine

## 2021-06-13 ENCOUNTER — Emergency Department (HOSPITAL_BASED_OUTPATIENT_CLINIC_OR_DEPARTMENT_OTHER)
Admission: EM | Admit: 2021-06-13 | Discharge: 2021-06-13 | Disposition: A | Discharge status: Home / Self Care | Payer: Medicare Other | Attending: Emergency Medicine | Admitting: Emergency Medicine

## 2021-06-13 DIAGNOSIS — I10 Essential (primary) hypertension: Secondary | ICD-10-CM | POA: Diagnosis not present

## 2021-06-13 DIAGNOSIS — Z794 Long term (current) use of insulin: Secondary | ICD-10-CM | POA: Insufficient documentation

## 2021-06-13 DIAGNOSIS — Z87891 Personal history of nicotine dependence: Secondary | ICD-10-CM | POA: Insufficient documentation

## 2021-06-13 DIAGNOSIS — N3090 Cystitis, unspecified without hematuria: Secondary | ICD-10-CM | POA: Insufficient documentation

## 2021-06-13 DIAGNOSIS — R4182 Altered mental status, unspecified: Secondary | ICD-10-CM | POA: Diagnosis not present

## 2021-06-13 DIAGNOSIS — Z7984 Long term (current) use of oral hypoglycemic drugs: Secondary | ICD-10-CM | POA: Diagnosis not present

## 2021-06-13 DIAGNOSIS — E119 Type 2 diabetes mellitus without complications: Secondary | ICD-10-CM | POA: Insufficient documentation

## 2021-06-13 DIAGNOSIS — Z79899 Other long term (current) drug therapy: Secondary | ICD-10-CM | POA: Diagnosis not present

## 2021-06-13 DIAGNOSIS — Z85038 Personal history of other malignant neoplasm of large intestine: Secondary | ICD-10-CM | POA: Diagnosis not present

## 2021-06-13 DIAGNOSIS — R509 Fever, unspecified: Secondary | ICD-10-CM | POA: Diagnosis present

## 2021-06-13 LAB — COMPREHENSIVE METABOLIC PANEL
ALT: 14 U/L (ref 0–44)
AST: 19 U/L (ref 15–41)
Albumin: 4.5 g/dL (ref 3.5–5.0)
Alkaline Phosphatase: 92 U/L (ref 38–126)
Anion gap: 12 (ref 5–15)
BUN: 38 mg/dL — ABNORMAL HIGH (ref 8–23)
CO2: 20 mmol/L — ABNORMAL LOW (ref 22–32)
Calcium: 9.4 mg/dL (ref 8.9–10.3)
Chloride: 103 mmol/L (ref 98–111)
Creatinine, Ser: 1.32 mg/dL — ABNORMAL HIGH (ref 0.44–1.00)
GFR, Estimated: 41 mL/min — ABNORMAL LOW (ref >60)
Glucose, Bld: 198 mg/dL — ABNORMAL HIGH (ref 70–99)
Potassium: 5.3 mmol/L — ABNORMAL HIGH (ref 3.5–5.1)
Sodium: 135 mmol/L (ref 135–145)
Total Bilirubin: 0.3 mg/dL (ref 0.3–1.2)
Total Protein: 7.5 g/dL (ref 6.5–8.1)

## 2021-06-13 LAB — CBC
HCT: 34.1 % — ABNORMAL LOW (ref 36.0–46.0)
Hemoglobin: 11.2 g/dL — ABNORMAL LOW (ref 12.0–15.0)
MCH: 30.4 pg (ref 26.0–34.0)
MCHC: 32.8 g/dL (ref 30.0–36.0)
MCV: 92.4 fL (ref 80.0–100.0)
Platelets: 288 10*3/uL (ref 150–400)
RBC: 3.69 MIL/uL — ABNORMAL LOW (ref 3.87–5.11)
RDW: 13.3 % (ref 11.5–15.5)
WBC: 9.1 10*3/uL (ref 4.0–10.5)
nRBC: 0 % (ref 0.0–0.2)

## 2021-06-13 LAB — URINALYSIS, ROUTINE W REFLEX MICROSCOPIC
Bilirubin Urine: NEGATIVE
Glucose, UA: 250 mg/dL — AB
Ketones, ur: 40 mg/dL — AB
Nitrite: POSITIVE — AB
Protein, ur: 30 mg/dL — AB
Specific Gravity, Urine: 1.022 (ref 1.005–1.030)
pH: 5.5 (ref 5.0–8.0)

## 2021-06-13 LAB — CBG MONITORING, ED: Glucose-Capillary: 179 mg/dL — ABNORMAL HIGH (ref 70–99)

## 2021-06-13 MED ORDER — SODIUM CHLORIDE 0.9 % IV SOLN
1.0000 g | Freq: Once | INTRAVENOUS | Status: AC
  Administered 2021-06-13: 1 g via INTRAVENOUS
  Filled 2021-06-13: qty 10

## 2021-06-13 MED ORDER — SODIUM CHLORIDE 0.9 % IV BOLUS
500.0000 mL | Freq: Once | INTRAVENOUS | Status: AC
  Administered 2021-06-13: 500 mL via INTRAVENOUS

## 2021-06-13 MED ORDER — CEPHALEXIN 500 MG PO CAPS: 500.0000 mg | ORAL_CAPSULE | Freq: Three times a day (TID) | ORAL | 0 refills | Status: DC

## 2021-06-13 NOTE — ED Notes (Signed)
Patient base line is some confusion.  Daughter states patient having more confusion then usual.  States last night had been speaking with unclear or made up words.  Also states been having problems with blood sugar management.

## 2021-06-13 NOTE — ED Notes (Signed)
Patient verbalizes understanding of discharge instructions. Opportunity for questioning and answers were provided. Patient discharged from ED.  °

## 2021-06-13 NOTE — ED Triage Notes (Signed)
Pt reports severe headache yesterday with high blood pressure readings. Family states she had some confusion yesterday and woke up confused this morning. Pt states headache has subsided.

## 2021-06-13 NOTE — Telephone Encounter (Signed)
Patient's daughter Tressia Miners requests to be called at ph# [phone] re: Patient's blood sugars are running high-last night blood sugars were 133 (was not given insulin)-this morning Patient was having cognitive problems and acting strange-Patient's blood sugars were 255. Traci and Patient's husband request to be called to find out if Patient needs to go the emergency room. Patient's husband wants to know if he should give Patient insulin now.

## 2021-06-13 NOTE — Telephone Encounter (Signed)
Spoke with pt's daughter and advised per provider to go to the ED.

## 2021-06-13 NOTE — ED Provider Notes (Signed)
[facility] EM[facility]GENC[facility] DEPT Provider Note   CSN: [id number] Arrival date & time: 06/13/21  1045     History Chief Complaint  Patient presents with   Headache   Altered Mental Status    Beth [name] is a 80 y.o. female.  HPI     80 year old female comes in with chief complaint of headache, altered mental status.  Patient has history of diabetes, renal syndrome, sarcoidosis.  Family is at the bedside.  They report that over the last 6 months patient has precipitous cognitive decline.  She was seen by neurologist after an acute event that had taken her to the ER, and the work-up did not reveal any evidence of Parkinson's, tumors or demyelinating disease.  After that event, patient had an episode of UTI that caused altered mental status and further weakness.  Yesterday patient had an event of mobile speech and confusion.  She then went to sleep earlier than usual.  Her blood sugar was low, therefore insulin was not given.  When she woke up, her insulin was higher and she appeared more confused.  She had a headache that has now improved.  No trauma.  They called the PCP clinic, they were advised to come to the ER.  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 ; reports hasnt had any affliction in years    Wears glasses    Wears partial dentures    top and bottom partials    Patient Active Problem List   Diagnosis Date Noted   Multiple lung nodules on CT 05/16/2021   Colon cancer ([place]) 05/16/2021   Spinal stenosis, 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   Overweight (BMI 25.0-29.9) 04/15/2018   Essential  hypertension 04/15/2018   Cervical spine pain 03/22/2018   Lumbar pain 03/22/2018   Mass 12/23/2017   Hyperlipidemia 06/14/2017   Nail deformity 07/30/2016   Type 2 diabetes mellitus with hyperglycemia, without long-term current use of insulin ([place]) 04/13/2016    Past Surgical History:  Procedure Laterality Date   ABDOMINAL HYSTERECTOMY     APPENDECTOMY     CATARACT EXTRACTION, BILATERAL     COLONOSCOPY     many   COMBINED MEDIASTINOSCOPY AND BRONCHOSCOPY  2002   biopsy   DG ARTHRO THUMB*R*  2011   ablasion rt thumb nailbed   FORAMINOTOMY 1 LEVEL Bilateral 05/09/2018   Procedure: Foraminotomy L4-L5 Root;  Surgeon: [name], MD;  Location: [facility];  Service: Orthopedics;  Laterality: Bilateral;   I & D EXTREMITY Right 07/23/2013   Procedure: IRRIGATION AND DEBRIDEMENT DIP JOINT RIGHT INDEX FINGER, EXCISE MUCOID CYST RIGHT INDEX FINGER  ;  Surgeon: Cammie Sickle., MD;  Location: [facility];  Service: Orthopedics;  Laterality: Right;   LIPOMA EXCISION     LUMBAR LAMINECTOMY/DECOMPRESSION MICRODISCECTOMY N/A 05/09/2018   Procedure: Complete decompressive lumbar laminectomy L4-L5;  Surgeon: [name], MD;  Location: [facility];  Service: Orthopedics;  Laterality: N/A;  123min   MASS EXCISION Right 09/28/2014   Procedure: EXCISION MASS RIGHT  MIDDLE FINGER;  Surgeon: Daryll Brod, MD;  Location: [facility];  Service: Orthopedics;  Laterality: Right;  ANESTHESIA:  GENERAL, IV REGIONAL FAB   MASS EXCISION Bilateral 12/24/2017   Procedure: EXCISION MASS RIGHT SMALL, LEFT INDEX, excision left nail horn middle finger;  Surgeon: Daryll Brod, MD;  Location: [facility];  Service: Orthopedics;  Laterality: Bilateral;  FAB   NAILBED REPAIR Left 08/28/2016   Procedure: Ablation nail matrix left index finger andl left middle finger;  Surgeon: Daryll Brod, MD;  Location: [facility];  Service: Orthopedics;  Laterality: Left;   PARTIAL  COLECTOMY  1998   cancer-rad/chemo   SKIN FULL THICKNESS GRAFT Right 07/23/2013   Procedure: NAIL PLATE ABLATION WITH FULL THICKNESS SKIN GRAFT FROM RIGHT   MEDIAL ELBOW TO RIGHT LONG FINGER NAIL BED;  Surgeon: Cammie Sickle., MD;  Location: [facility];  Service: Orthopedics;  Laterality: Right;   SKIN FULL THICKNESS GRAFT Left 08/28/2016   Procedure: SKIN GRAFT FULL THICKNESS upper left arm;  Surgeon: Daryll Brod, MD;  Location: [facility];  Service: Orthopedics;  Laterality: Left;     OB History   No obstetric history on file.     Family History  Problem Relation Age of Onset   Cancer Mother        colon    Hypertension Father    CVA Father    Obesity Brother     Social History   Tobacco Use   Smoking status: Former    Packs/day: 0.50    Types: Cigarettes    Quit date: 07/22/1971    Years since quitting: 49.9   Smokeless tobacco: Never  Vaping Use   Vaping Use: Never used  Substance Use Topics   Alcohol use: No    Alcohol/week: 0.0 standard drinks   Drug use: No    Home Medications Prior to Admission medications   Medication Sig Start Date End Date Taking? Authorizing Provider  acetaminophen (TYLENOL) 325 MG tablet Take 325-650 mg by mouth 2 (two) times daily as needed for moderate pain.    Yes [provider]  glipiZIDE (GLUCOTROL XL) 10 MG 24 hr tablet TAKE 2 TABLETs in am and 1 tablet before dinner Patient taking differently: Take by mouth daily with breakfast. 1 in am 01/31/21  Yes Philemon Kingdom, MD  insulin degludec (TRESIBA FLEXTOUCH) 100 UNIT/ML FlexTouch Pen INJECT 15 UNITS SUBCUTANEOUSLY DAILY 01/31/21  Yes Philemon Kingdom, MD  lisinopril (ZESTRIL) 10 MG tablet Take 10 mg by mouth daily.   Yes Noemi Chapel, MD  loratadine (CLARITIN) 10 MG tablet Take 10 mg by mouth daily as needed for allergies.   Yes [provider]  lovastatin (MEVACOR) 10 MG tablet Take 10 mg by mouth every evening.    Yes [provider]  metFORMIN (GLUCOPHAGE) 1000 MG tablet TAKE 1 TABLET TWICE DAILY WITH MEALS 01/31/21  Yes Philemon Kingdom, MD  Multiple Vitamin (MULTIVITAMIN) tablet Take 1 tablet by mouth daily.   Yes [provider]  ramipril (ALTACE) 10 MG capsule Take 10 mg by mouth daily.   Yes [provider]  Blood Glucose Monitoring Suppl (ONETOUCH VERIO) w/Device KIT Use as instructed to check blood sugar 4 times daily 05/25/21   Philemon Kingdom, MD  DROPLET PEN NEEDLES 32G X 5 MM MISC USE TO INJECT INSULIN ONE TIME DAILY AS DIRECTED (NEED APPOINTMENT FOR FURTHER REFILLS) 04/10/21   Philemon Kingdom, MD  glucose blood (ONETOUCH VERIO)  test strip Use as instructed to check blood sugar 4 times daily 05/25/21   Philemon Kingdom, MD  Lancets [facility] ULTRASOFT) lancets Use as instructed to check blood sugar 4 times daily 05/25/21   Philemon Kingdom, MD  naproxen (NAPROSYN) 500 MG tablet Take 1 tablet (500 mg total) by mouth 2 (two) times daily with a meal. 04/08/21   Noemi Chapel, MD  neomycin-polymyxin-hydrocortisone (CORTISPORIN) OTIC solution Apply 1-2 drops to toe BID after soaking Patient not taking: No sig reported 07/01/19   Wallene Huh, DPM  Tetrahydrozoline HCl (VISINE OP) Place 1 drop into both eyes daily as needed (irritation).    [provider]    Allergies    Patient has no known allergies.  Review of Systems   Review of Systems  Constitutional:  Positive for activity change.  HENT:  Negative for congestion.   Respiratory:  Negative for shortness of breath.   Cardiovascular:  Negative for chest pain.  Gastrointestinal:  Negative for nausea and vomiting.  Genitourinary:  Positive for urgency. Negative for dysuria.  Hematological:  Does not bruise/bleed easily.   Physical Exam Updated Vital Signs BP (!) 184/76   Pulse 84   Temp 99.1 F (37.3 C) (Oral)   Resp 17   Ht $R'5\' 1"'qv$  (1.549 m)   Wt 70.3 kg   SpO2 100%   BMI 29.29 kg/m   Physical Exam Vitals  and nursing note reviewed.  Constitutional:      Appearance: She is well-developed.  HENT:     Head: Atraumatic.  Eyes:     Extraocular Movements: Extraocular movements intact.     Pupils: Pupils are equal, round, and reactive to light.  Cardiovascular:     Rate and Rhythm: Normal rate.  Pulmonary:     Effort: Pulmonary effort is normal.  Musculoskeletal:     Cervical back: Normal range of motion and neck supple.  Skin:    General: Skin is warm and dry.  Neurological:     Mental Status: She is alert and oriented to person, place, and time.     GCS: GCS eye subscore is 4. GCS verbal subscore is 5. GCS motor subscore is 6.     Sensory: No sensory deficit.     Motor: No weakness.    ED Results / Procedures / Treatments   Labs (all labs ordered are listed, but only abnormal results are displayed) Labs Reviewed  COMPREHENSIVE METABOLIC PANEL - Abnormal; Notable for the following components:      Result Value   Potassium 5.3 (*)    CO2 20 (*)    Glucose, Bld 198 (*)    BUN 38 (*)    Creatinine, Ser 1.32 (*)    GFR, Estimated 41 (*)    All other components within normal limits  CBC - Abnormal; Notable for the following components:   RBC 3.69 (*)    Hemoglobin 11.2 (*)    HCT 34.1 (*)    All other components within normal limits  URINALYSIS, ROUTINE W REFLEX MICROSCOPIC - Abnormal; Notable for the following components:   APPearance HAZY (*)    Glucose, UA 250 (*)    Hgb urine dipstick TRACE (*)    Ketones, ur 40 (*)    Protein, ur 30 (*)    Nitrite POSITIVE (*)    Leukocytes,Ua TRACE (*)    Bacteria, UA MANY (*)    All other components within normal limits  CBG MONITORING, ED - Abnormal; Notable for the following components:  Glucose-Capillary 179 (*)    All other components within normal limits  CBG MONITORING, ED    EKG None  Radiology CT HEAD WO CONTRAST (5MM)  Result Date: 06/13/2021 CLINICAL DATA:  Mental status change, unknown cause EXAM: CT HEAD WITHOUT  CONTRAST TECHNIQUE: Contiguous axial images were obtained from the base of the skull through the vertex without intravenous contrast. COMPARISON:  July 2022 FINDINGS: Brain: There is no acute intracranial hemorrhage, mass effect, or edema. Gray-white differentiation is preserved. There is no extra-axial fluid collection. Stable prominence of the ventricles and sulci reflecting parenchymal volume loss. Patchy and confluent hypoattenuation in the supratentorial white matter likely reflects stable chronic microvascular ischemic changes. Vascular: There is atherosclerotic calcification at the skull base. Skull: Calvarium is unremarkable. Sinuses/Orbits: Patchy mucosal thickening.  Orbits are unremarkable. Other: None. IMPRESSION: No acute intracranial abnormality. Electronically Signed   By: Macy Mis M.D.   On: 06/13/2021 14:03    Procedures Procedures   Medications Ordered in ED Medications  cefTRIAXone (ROCEPHIN) 1 g in sodium chloride 0.9 % 100 mL IVPB (has no administration in time range)  sodium chloride 0.9 % bolus 500 mL (500 mLs Intravenous New Bag/Given 06/13/21 1439)    ED Course  I have reviewed the triage vital signs and the nursing notes.  Pertinent labs & imaging results that were available during my care of the patient were reviewed by me and considered in my medical decision making (see chart for details).    MDM Rules/Calculators/A&P                           80 year old female comes in with chief complaint of altered mental status.  It appears that yesterday she had an event of aphasia versus dysarthria.  She had stroke work-up done earlier this year which was negative.  Patient was subsequently sent to neurologist, who has not uncovered a specific diagnosis, but family reports that over the last 6 months patient has had significant cognitive delay.  Her neuro exam is nonfocal. Her confusion is worse than usual per family, but she is following all commands  properly.  Concerns for dehydration, electrolyte abnormality, UTI. CT head ordered to ensure there is no acute change.  3:27 PM Urine analysis is nitrite positive.  Labs also reveal possibly mild dehydration. We will give her fluid resuscitation and antibiotics.  Stable for discharge.  Results discussed with the patient and the family.  They are comfortable with the plan.  I will send a message to the neurologist as well about this visit at the request of the patient's family.  The patient appears reasonably screened and/or stabilized for discharge and I doubt any other medical condition or other [facility] requiring further screening, evaluation, or treatment in the ED at this time prior to discharge.   Results from the ER workup discussed with the patient face to face and all questions answered to the best of my ability. The patient is safe for discharge with strict return precautions.   Final Clinical Impression(s) / ED Diagnoses Final diagnoses:  Cystitis    Rx / DC Orders ED Discharge Orders     None        Varney Biles, MD 06/13/21 1528

## 2021-06-13 NOTE — Discharge Instructions (Addendum)
We suspect that the symptoms Beth [name] is having were precipitated by UTI  Take the antibiotics that are prescribed A message has been sent to her neurologist to see if they can get an appointment sooner. It might be a good idea to follow-up with the primary care doctor in 7 to 14 days.  Please return to the ER if your symptoms worsen; you have increased pain, fevers, chills, inability to keep any medications down, confusion. Otherwise see the outpatient doctor as requested.

## 2021-06-15 ENCOUNTER — Telehealth: Payer: Self-pay

## 2021-06-15 ENCOUNTER — Ambulatory Visit (INDEPENDENT_AMBULATORY_CARE_PROVIDER_SITE_OTHER): Payer: Medicare Other | Admitting: Pulmonary Disease

## 2021-06-15 ENCOUNTER — Encounter: Payer: Self-pay | Admitting: Pulmonary Disease

## 2021-06-15 ENCOUNTER — Other Ambulatory Visit: Payer: Self-pay

## 2021-06-15 VITALS — BP 132/80 | HR 82 | Ht 61.0 in | Wt 154.0 lb

## 2021-06-15 DIAGNOSIS — R599 Enlarged lymph nodes, unspecified: Secondary | ICD-10-CM | POA: Diagnosis not present

## 2021-06-15 DIAGNOSIS — Z862 Personal history of diseases of the blood and blood-forming organs and certain disorders involving the immune mechanism: Secondary | ICD-10-CM | POA: Diagnosis not present

## 2021-06-15 DIAGNOSIS — Z87891 Personal history of nicotine dependence: Secondary | ICD-10-CM

## 2021-06-15 DIAGNOSIS — J984 Other disorders of lung: Secondary | ICD-10-CM | POA: Diagnosis not present

## 2021-06-15 DIAGNOSIS — Z85038 Personal history of other malignant neoplasm of large intestine: Secondary | ICD-10-CM | POA: Diagnosis not present

## 2021-06-15 NOTE — H&P (View-Only) (Signed)
Synopsis: Referred in September 2022 for bilateral upper lobe cavitary lesions by [facility]  Subjective:   PATIENT ID: Beth [name] GENDER: female DOB: 10-Sep-1941, MRN: [id number]  Chief Complaint  Patient presents with   Consult    Referred by MW for lung nodule found on recent PET.     This is a 80 year old female, past medical history of colon cancer, psoriasis, sarcoidosis that affected the I had adenopathy of the chest in 2002, no prior biopsy, has psoriasis, hypertension hyperlipidemia.  Currently having issues with recurrent UTI.  Family also comments that she had seen some cognitive decline.  Had an event where she passed out went to the emergency department had CT imaging which found incidental upper lobe lesions.  Subsequently had follow-up with pulmonary.  Patient saw Dr. Melvyn Novas.  He ordered a PET scan which revealed to upper lobe hypermetabolic lesions and small adenopathy within the chest.  We are here today to review these images and discuss next steps.   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 ; reports hasnt had any affliction in years    Wears glasses    Wears partial dentures    top and bottom partials     Family History  Problem Relation Age of Onset   Cancer Mother        colon    Hypertension Father    CVA Father    Obesity Brother      Past Surgical History:  Procedure Laterality Date   ABDOMINAL HYSTERECTOMY     APPENDECTOMY     CATARACT EXTRACTION, BILATERAL     COLONOSCOPY     many   COMBINED MEDIASTINOSCOPY AND BRONCHOSCOPY  2002   biopsy   DG ARTHRO [facility]*  2011   ablasion rt  thumb nailbed   FORAMINOTOMY 1 LEVEL Bilateral 05/09/2018   Procedure: Foraminotomy L4-L5 Root;  Surgeon: [name], MD;  Location: [facility];  Service: Orthopedics;  Laterality: Bilateral;   I & D EXTREMITY Right 07/23/2013   Procedure: IRRIGATION AND DEBRIDEMENT DIP JOINT RIGHT INDEX FINGER, EXCISE MUCOID CYST RIGHT INDEX FINGER  ;  Surgeon: Cammie Sickle., MD;  Location: [facility];  Service: Orthopedics;  Laterality: Right;   LIPOMA EXCISION     LUMBAR LAMINECTOMY/DECOMPRESSION MICRODISCECTOMY N/A 05/09/2018   Procedure: Complete decompressive lumbar laminectomy L4-L5;  Surgeon: [name], MD;  Location: [facility];  Service: Orthopedics;  Laterality: N/A;  121mn   MASS EXCISION Right 09/28/2014   Procedure: EXCISION MASS RIGHT MIDDLE FINGER;  Surgeon: GDaryll Brod MD;  [place] [facility]  Service: Orthopedics;  Laterality: Right;  ANESTHESIA:  GENERAL, IV REGIONAL FAB   MASS EXCISION Bilateral 12/24/2017   Procedure: EXCISION MASS RIGHT SMALL, LEFT INDEX, excision left nail horn middle finger;  Surgeon: KDaryll Brod MD;  [place] [facility]  Service: Orthopedics;  Laterality: Bilateral;  FAB   NAILBED REPAIR Left 08/28/2016   Procedure: Ablation nail matrix left index finger andl left middle finger;  Surgeon: Daryll Brod, MD;  Location: [place];  Service: Orthopedics;  Laterality: Left;   PARTIAL COLECTOMY  1998   cancer-rad/chemo   SKIN FULL THICKNESS GRAFT Right 07/23/2013   Procedure: NAIL PLATE ABLATION WITH FULL THICKNESS SKIN GRAFT FROM RIGHT   MEDIAL ELBOW TO RIGHT LONG FINGER NAIL BED;  Surgeon: Cammie Sickle., MD;  Location: [place];  Service: Orthopedics;  Laterality: Right;   SKIN FULL THICKNESS GRAFT Left 08/28/2016   Procedure: SKIN GRAFT FULL THICKNESS upper left arm;  Surgeon: Daryll Brod, MD;  Location: [place];  Service: Orthopedics;  Laterality: Left;    Social  History   Socioeconomic History   Marital status: Married    Spouse name: Not on file   Number of children: Not on file   Years of education: Not on file   Highest education level: Not on file  Occupational History   Not on file  Tobacco Use   Smoking status: Former    Packs/day: 0.50    Types: Cigarettes    Quit date: 07/22/1971    Years since quitting: 49.9   Smokeless tobacco: Never  Vaping Use   Vaping Use: Never used  Substance and Sexual Activity   Alcohol use: No    Alcohol/week: 0.0 standard drinks   Drug use: No   Sexual activity: Yes  Other Topics Concern   Not on file  Social History Narrative   Married   3 children   Social Determinants of Health   Financial Resource Strain: Not on file  Food Insecurity: Not on file  Transportation Needs: Not on file  Physical Activity: Not on file  Stress: Not on file  Social Connections: Not on file  Intimate Partner Violence: Not on file     No Known Allergies   Outpatient Medications Prior to Visit  Medication Sig Dispense Refill   acetaminophen (TYLENOL) 325 MG tablet Take 325-650 mg by mouth 2 (two) times daily as needed for moderate pain.      Blood Glucose Monitoring Suppl (ONETOUCH VERIO) w/Device KIT Use as instructed to check blood sugar 4 times daily 1 kit 0   cephALEXin (KEFLEX) 500 MG capsule Take 1 capsule (500 mg total) by mouth 3 (three) times daily. 21 capsule 0   DROPLET PEN NEEDLES 32G X 5 MM MISC USE TO INJECT INSULIN ONE TIME DAILY AS DIRECTED (NEED APPOINTMENT FOR FURTHER REFILLS) 100 each 2   glipiZIDE (GLUCOTROL XL) 10 MG 24 hr tablet TAKE 2 TABLETs in am and 1 tablet before dinner (Patient taking differently: Take by mouth daily with breakfast. 1 in am) 270 tablet 3   glucose blood (ONETOUCH VERIO) test strip Use as instructed to check blood sugar 4 times daily 400 each 3   insulin degludec (TRESIBA FLEXTOUCH) 100 UNIT/ML FlexTouch Pen INJECT 15 UNITS SUBCUTANEOUSLY DAILY 15 mL 3   Lancets  (ONETOUCH ULTRASOFT) lancets Use as instructed to check blood sugar 4 times daily 400 each 3   lisinopril (ZESTRIL) 10 MG tablet Take 10 mg by mouth daily.     loratadine (CLARITIN) 10 MG tablet Take 10 mg by mouth daily as needed for allergies.     lovastatin (MEVACOR) 10 MG tablet Take 10 mg by mouth every evening.      metFORMIN (GLUCOPHAGE) 1000 MG tablet TAKE 1 TABLET TWICE DAILY WITH MEALS 180 tablet 3   Multiple Vitamin (MULTIVITAMIN) tablet Take 1 tablet by mouth daily.  ramipril (ALTACE) 10 MG capsule Take 10 mg by mouth daily.     naproxen (NAPROSYN) 500 MG tablet Take 1 tablet (500 mg total) by mouth 2 (two) times daily with a meal. 30 tablet 0   neomycin-polymyxin-hydrocortisone (CORTISPORIN) OTIC solution Apply 1-2 drops to toe BID after soaking (Patient not taking: No sig reported) 10 mL 1   Tetrahydrozoline HCl (VISINE OP) Place 1 drop into both eyes daily as needed (irritation).     No facility-administered medications prior to visit.    Review of Systems  Constitutional:  Negative for chills, fever, malaise/fatigue and weight loss.  HENT:  Negative for hearing loss, sore throat and tinnitus.   Eyes:  Negative for blurred vision and double vision.  Respiratory:  Negative for cough, hemoptysis, sputum production, shortness of breath, wheezing and stridor.   Cardiovascular:  Negative for chest pain, palpitations, orthopnea, leg swelling and PND.  Gastrointestinal:  Negative for abdominal pain, constipation, diarrhea, heartburn, nausea and vomiting.  Genitourinary:  Positive for urgency. Negative for dysuria and hematuria.  Musculoskeletal:  Negative for joint pain and myalgias.  Skin:  Negative for itching and rash.  Neurological:  Negative for dizziness, tingling, weakness and headaches.  Endo/Heme/Allergies:  Negative for environmental allergies. Does not bruise/bleed easily.  Psychiatric/Behavioral:  Negative for depression. The patient is not nervous/anxious and does  not have insomnia.   All other systems reviewed and are negative.   Objective:  Physical Exam Vitals reviewed.  Constitutional:      General: She is not in acute distress.    Appearance: She is well-developed.  HENT:     Head: Normocephalic and atraumatic.  Eyes:     General: No scleral icterus.    Conjunctiva/sclera: Conjunctivae normal.     Pupils: Pupils are equal, round, and reactive to light.  Neck:     Vascular: No JVD.     Trachea: No tracheal deviation.  Cardiovascular:     Rate and Rhythm: Normal rate and regular rhythm.     Heart sounds: Normal heart sounds. No murmur heard. Pulmonary:     Effort: Pulmonary effort is normal. No tachypnea, accessory muscle usage or respiratory distress.     Breath sounds: No stridor. No wheezing, rhonchi or rales.  Abdominal:     General: Bowel sounds are normal. There is no distension.     Palpations: Abdomen is soft.     Tenderness: There is no abdominal tenderness.  Musculoskeletal:        General: No tenderness.     Cervical back: Neck supple.  Lymphadenopathy:     Cervical: No cervical adenopathy.  Skin:    General: Skin is warm and dry.     Capillary Refill: Capillary refill takes less than 2 seconds.     Findings: No rash.  Neurological:     Mental Status: She is alert and oriented to person, place, and time.  Psychiatric:        Behavior: Behavior normal.     Vitals:   06/15/21 1037  BP: 132/80  Pulse: 82  SpO2: 98%  Weight: 154 lb (69.9 kg)  Height: 5' 1"  (1.549 m)   98% on RA BMI Readings from Last 3 Encounters:  06/15/21 29.10 kg/m  06/13/21 29.29 kg/m  05/17/21 28.91 kg/m   Wt Readings from Last 3 Encounters:  06/15/21 154 lb (69.9 kg)  06/13/21 155 lb (70.3 kg)  05/17/21 153 lb (69.4 kg)     CBC    Component Value Date/Time  WBC 9.1 06/13/2021 1155   RBC 3.69 (L) 06/13/2021 1155   HGB 11.2 (L) 06/13/2021 1155   HCT 34.1 (L) 06/13/2021 1155   PLT 288 06/13/2021 1155   MCV 92.4  06/13/2021 1155   MCH 30.4 06/13/2021 1155   MCHC 32.8 06/13/2021 1155   RDW 13.3 06/13/2021 1155   LYMPHSABS 1.8 04/08/2021 1201   MONOABS 0.4 04/08/2021 1201   EOSABS 0.2 04/08/2021 1201   BASOSABS 0.1 04/08/2021 1201     Chest Imaging: 05/31/2021: Symmetric upper lobe thick-walled hypermetabolic cavitary lesions possibly infectious versus synchronous primary. Small adenopathy within the mediastinum. The patient's images have been independently reviewed by me.    Pulmonary Functions Testing Results: No flowsheet data found.  FeNO: no  Pathology: no  Echocardiogram: no  Heart Catheterization: no    Assessment & Plan:     ICD-10-CM   1. Cavitary lesion of lung  J98.4 Ambulatory referral to Pulmonology    CT Super D Chest Wo Contrast    Procedural/ Surgical Case Request: VIDEO BRONCHOSCOPY WITH ENDOBRONCHIAL NAVIGATION, VIDEO BRONCHOSCOPY WITH ENDOBRONCHIAL ULTRASOUND    2. Adenopathy  R59.9     3. History of colon cancer  Z85.038     4. Former smoker  Z87.891     5. History of sarcoidosis  Z86.2       Discussion:  This is a 80 year old female, former smoker, history of colon cancer, history of sarcoidosis no biopsy had eye involvement and adenopathy in the chest in 2002.  I looked at CT imaging reports from 2002 which document pretracheal and subcarinal adenopathy.  She had incidentally found bilateral upper lobe cavitary lesions that are hypermetabolic on PET scan.  Plan: We reviewed images today in the office we discussed possible next best steps as well as the various etiologies to include clued infectious, inflammatory and possible underlying synchronous primary malignancies.  Patient is agreeable to proceed with robotic assisted bronchoscopy and tissue sampling. Will need to send tissue biopsies as well as cultures from these areas. Also at the same time I like to stage the mediastinum with EBUS transbronchial biopsies of the mediastinal nodes.  We discussed  the risk benefits and alternatives of this to include bleeding and pneumothorax. Patient is agreeable to proceed. We will plan for this on October 4 at [facility] endoscopy. Orders have been placed.  We appreciate [facility] help with scheduling.    Current Outpatient Medications:    acetaminophen (TYLENOL) 325 MG tablet, Take 325-650 mg by mouth 2 (two) times daily as needed for moderate pain. , Disp: , Rfl:    Blood Glucose Monitoring Suppl (ONETOUCH VERIO) w/Device KIT, Use as instructed to check blood sugar 4 times daily, Disp: 1 kit, Rfl: 0   cephALEXin (KEFLEX) 500 MG capsule, Take 1 capsule (500 mg total) by mouth 3 (three) times daily., Disp: 21 capsule, Rfl: 0   DROPLET PEN NEEDLES 32G X 5 MM MISC, USE TO INJECT INSULIN ONE TIME DAILY AS DIRECTED (NEED APPOINTMENT FOR FURTHER REFILLS), Disp: 100 each, Rfl: 2   glipiZIDE (GLUCOTROL XL) 10 MG 24 hr tablet, TAKE 2 TABLETs in am and 1 tablet before dinner (Patient taking differently: Take by mouth daily with breakfast. 1 in am), Disp: 270 tablet, Rfl: 3   glucose blood (ONETOUCH VERIO) test strip, Use as instructed to check blood sugar 4 times daily, Disp: 400 each, Rfl: 3   insulin degludec (TRESIBA FLEXTOUCH) 100 UNIT/ML FlexTouch Pen, INJECT 15 UNITS SUBCUTANEOUSLY DAILY, Disp: 15 mL, Rfl:  3   Lancets (ONETOUCH ULTRASOFT) lancets, Use as instructed to check blood sugar 4 times daily, Disp: 400 each, Rfl: 3   lisinopril (ZESTRIL) 10 MG tablet, Take 10 mg by mouth daily., Disp: , Rfl:    loratadine (CLARITIN) 10 MG tablet, Take 10 mg by mouth daily as needed for allergies., Disp: , Rfl:    lovastatin (MEVACOR) 10 MG tablet, Take 10 mg by mouth every evening. , Disp: , Rfl:    metFORMIN (GLUCOPHAGE) 1000 MG tablet, TAKE 1 TABLET TWICE DAILY WITH MEALS, Disp: 180 tablet, Rfl: 3   Multiple Vitamin (MULTIVITAMIN) tablet, Take 1 tablet by mouth daily., Disp: , Rfl:    ramipril (ALTACE) 10 MG capsule, Take 10 mg by mouth daily., Disp: , Rfl:   I  spent 64 minutes dedicated to the care of this patient on the date of this encounter to include pre-visit review of records, face-to-face time with the patient discussing conditions above, post visit ordering of testing, clinical documentation with the electronic health record, making appropriate referrals as documented, and communicating necessary findings to members of the patients care team.   Garner Nash, [facility] 06/15/2021 11:30 AM

## 2021-06-15 NOTE — Telephone Encounter (Signed)
The following has been scheduled & pt has been made aware:  CT - at [facility] on 9/28 @ 4, checking in by 3:45 (requested disk be sent to [facility] attention [place])  COVID Test - 9/30 between [place] @ [place] 10/4 @ 7:30, checking in by 5:30 Case# [id number] NPO after midnight, no medications indicated to be stopped.

## 2021-06-15 NOTE — Progress Notes (Signed)
Synopsis: Referred in September 2022 for bilateral upper lobe cavitary lesions by [facility]  Subjective:   PATIENT ID: Beth [name] GENDER: female DOB: [birth year], MRN: [id number]  Chief Complaint  Patient presents with   Consult    Referred by MW for lung nodule found on recent PET.     This is a 80 year old female, past medical history of colon cancer, psoriasis, sarcoidosis that affected the I had adenopathy of the chest in 2002, no prior biopsy, has psoriasis, hypertension hyperlipidemia.  Currently having issues with recurrent UTI.  Family also comments that she had seen some cognitive decline.  Had an event where she passed out went to the emergency department had CT imaging which found incidental upper lobe lesions.  Subsequently had follow-up with pulmonary.  Patient saw Dr. Melvyn Novas.  He ordered a PET scan which revealed to upper lobe hypermetabolic lesions and small adenopathy within the chest.  We are here today to review these images and discuss next steps.   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 ; reports hasnt had any affliction in years    Wears glasses    Wears partial dentures    top and bottom partials     Family History  Problem Relation Age of Onset   Cancer Mother        colon    Hypertension Father    CVA Father    Obesity Brother      Past Surgical History:  Procedure Laterality Date   ABDOMINAL HYSTERECTOMY     APPENDECTOMY     CATARACT EXTRACTION, BILATERAL     COLONOSCOPY     many   COMBINED MEDIASTINOSCOPY AND BRONCHOSCOPY  2002   biopsy   DG [facility]RO [facility]*  2011   ablasion rt  thumb nailbed   FORAMINOTOMY 1 LEVEL Bilateral 05/09/2018   Procedure: Foraminotomy L4-L5 Root;  Surgeon: [name], MD;  Location: [facility];  Service: Orthopedics;  Laterality: Bilateral;   I & D EXTREMITY Right 07/23/2013   Procedure: IRRIGATION AND DEBRIDEMENT DIP JOINT RIGHT INDEX FINGER, EXCISE MUCOID CYST RIGHT INDEX FINGER  ;  Surgeon: Cammie Sickle., MD;  Location: [place];  Service: Orthopedics;  Laterality: Right;   LIPOMA EXCISION     LUMBAR LAMINECTOMY/DECOMPRESSION MICRODISCECTOMY N/A 05/09/2018   Procedure: Complete decompressive lumbar laminectomy L4-L5;  Surgeon: [name], MD;  Location: [facility];  Service: Orthopedics;  Laterality: N/A;  165mn   MASS EXCISION Right 09/28/2014   Procedure: EXCISION MASS RIGHT MIDDLE FINGER;  Surgeon: GDaryll Brod MD;  [place]  Service: Orthopedics;  Laterality: Right;  ANESTHESIA:  GENERAL, IV REGIONAL FAB   MASS EXCISION Bilateral 12/24/2017   Procedure: EXCISION MASS RIGHT SMALL, LEFT INDEX, excision left nail horn middle finger;  Surgeon: KDaryll Brod MD;  Location[place]  Service: Orthopedics;  Laterality: Bilateral;  FAB   NAILBED REPAIR Left 08/28/2016   Procedure: Ablation nail matrix left index finger andl left middle finger;  Surgeon: Daryll Brod, MD;  Location: [place];  Service: Orthopedics;  Laterality: Left;   PARTIAL COLECTOMY  1998   cancer-rad/chemo   SKIN FULL THICKNESS GRAFT Right 07/23/2013   Procedure: NAIL PLATE ABLATION WITH FULL THICKNESS SKIN GRAFT FROM RIGHT   MEDIAL ELBOW TO RIGHT LONG FINGER NAIL BED;  Surgeon: Cammie Sickle., MD;  Location: [place];  Service: Orthopedics;  Laterality: Right;   SKIN FULL THICKNESS GRAFT Left 08/28/2016   Procedure: SKIN GRAFT FULL THICKNESS upper left arm;  Surgeon: Daryll Brod, MD;  Location: [place];  Service: Orthopedics;  Laterality: Left;    Social  History   Socioeconomic History   Marital status: Married    Spouse name: Not on file   Number of children: Not on file   Years of education: Not on file   Highest education level: Not on file  Occupational History   Not on file  Tobacco Use   Smoking status: Former    Packs/day: 0.50    Types: Cigarettes    Quit date: 07/22/1971    Years since quitting: 49.9   Smokeless tobacco: Never  Vaping Use   Vaping Use: Never used  Substance and Sexual Activity   Alcohol use: No    Alcohol/week: 0.0 standard drinks   Drug use: No   Sexual activity: Yes  Other Topics Concern   Not on file  Social History Narrative   Married   3 children   Social Determinants of Health   Financial Resource Strain: Not on file  Food Insecurity: Not on file  Transportation Needs: Not on file  Physical Activity: Not on file  Stress: Not on file  Social Connections: Not on file  Intimate Partner Violence: Not on file     No Known Allergies   Outpatient Medications Prior to Visit  Medication Sig Dispense Refill   acetaminophen (TYLENOL) 325 MG tablet Take 325-650 mg by mouth 2 (two) times daily as needed for moderate pain.      Blood Glucose Monitoring Suppl (ONETOUCH VERIO) w/Device KIT Use as instructed to check blood sugar 4 times daily 1 kit 0   cephALEXin (KEFLEX) 500 MG capsule Take 1 capsule (500 mg total) by mouth 3 (three) times daily. 21 capsule 0   DROPLET PEN NEEDLES 32G X 5 MM MISC USE TO INJECT INSULIN ONE TIME DAILY AS DIRECTED (NEED APPOINTMENT FOR FURTHER REFILLS) 100 each 2   glipiZIDE (GLUCOTROL XL) 10 MG 24 hr tablet TAKE 2 TABLETs in am and 1 tablet before dinner (Patient taking differently: Take by mouth daily with breakfast. 1 in am) 270 tablet 3   glucose blood (ONETOUCH VERIO) test strip Use as instructed to check blood sugar 4 times daily 400 each 3   insulin degludec (TRESIBA FLEXTOUCH) 100 UNIT/ML FlexTouch Pen INJECT 15 UNITS SUBCUTANEOUSLY DAILY 15 mL 3   Lancets  (ONETOUCH ULTRASOFT) lancets Use as instructed to check blood sugar 4 times daily 400 each 3   lisinopril (ZESTRIL) 10 MG tablet Take 10 mg by mouth daily.     loratadine (CLARITIN) 10 MG tablet Take 10 mg by mouth daily as needed for allergies.     lovastatin (MEVACOR) 10 MG tablet Take 10 mg by mouth every evening.      metFORMIN (GLUCOPHAGE) 1000 MG tablet TAKE 1 TABLET TWICE DAILY WITH MEALS 180 tablet 3   Multiple Vitamin (MULTIVITAMIN) tablet Take 1 tablet by mouth daily.  ramipril (ALTACE) 10 MG capsule Take 10 mg by mouth daily.     naproxen (NAPROSYN) 500 MG tablet Take 1 tablet (500 mg total) by mouth 2 (two) times daily with a meal. 30 tablet 0   neomycin-polymyxin-hydrocortisone (CORTISPORIN) OTIC solution Apply 1-2 drops to toe BID after soaking (Patient not taking: No sig reported) 10 mL 1   Tetrahydrozoline HCl (VISINE OP) Place 1 drop into both eyes daily as needed (irritation).     No facility-administered medications prior to visit.    Review of Systems  Constitutional:  Negative for chills, fever, malaise/fatigue and weight loss.  HENT:  Negative for hearing loss, sore throat and tinnitus.   Eyes:  Negative for blurred vision and double vision.  Respiratory:  Negative for cough, hemoptysis, sputum production, shortness of breath, wheezing and stridor.   Cardiovascular:  Negative for chest pain, palpitations, orthopnea, leg swelling and PND.  Gastrointestinal:  Negative for abdominal pain, constipation, diarrhea, heartburn, nausea and vomiting.  Genitourinary:  Positive for urgency. Negative for dysuria and hematuria.  Musculoskeletal:  Negative for joint pain and myalgias.  Skin:  Negative for itching and rash.  Neurological:  Negative for dizziness, tingling, weakness and headaches.  Endo/Heme/Allergies:  Negative for environmental allergies. Does not bruise/bleed easily.  Psychiatric/Behavioral:  Negative for depression. The patient is not nervous/anxious and does  not have insomnia.   All other systems reviewed and are negative.   Objective:  Physical Exam Vitals reviewed.  Constitutional:      General: She is not in acute distress.    Appearance: She is well-developed.  HENT:     Head: Normocephalic and atraumatic.  Eyes:     General: No scleral icterus.    Conjunctiva/sclera: Conjunctivae normal.     Pupils: Pupils are equal, round, and reactive to light.  Neck:     Vascular: No JVD.     Trachea: No tracheal deviation.  Cardiovascular:     Rate and Rhythm: Normal rate and regular rhythm.     Heart sounds: Normal heart sounds. No murmur heard. Pulmonary:     Effort: Pulmonary effort is normal. No tachypnea, accessory muscle usage or respiratory distress.     Breath sounds: No stridor. No wheezing, rhonchi or rales.  Abdominal:     General: Bowel sounds are normal. There is no distension.     Palpations: Abdomen is soft.     Tenderness: There is no abdominal tenderness.  Musculoskeletal:        General: No tenderness.     Cervical back: Neck supple.  Lymphadenopathy:     Cervical: No cervical adenopathy.  Skin:    General: Skin is warm and dry.     Capillary Refill: Capillary refill takes less than 2 seconds.     Findings: No rash.  Neurological:     Mental Status: She is alert and oriented to person, place, and time.  Psychiatric:        Behavior: Behavior normal.     Vitals:   06/15/21 1037  BP: 132/80  Pulse: 82  SpO2: 98%  Weight: 154 lb (69.9 kg)  Height: 5' 1"  (1.549 m)   98% on RA BMI Readings from Last 3 Encounters:  06/15/21 29.10 kg/m  06/13/21 29.29 kg/m  05/17/21 28.91 kg/m   Wt Readings from Last 3 Encounters:  06/15/21 154 lb (69.9 kg)  06/13/21 155 lb (70.3 kg)  05/17/21 153 lb (69.4 kg)     CBC    Component Value Date/Time  WBC 9.1 06/13/2021 1155   RBC 3.69 (L) 06/13/2021 1155   HGB 11.2 (L) 06/13/2021 1155   HCT 34.1 (L) 06/13/2021 1155   PLT 288 06/13/2021 1155   MCV 92.4  06/13/2021 1155   MCH 30.4 06/13/2021 1155   MCHC 32.8 06/13/2021 1155   RDW 13.3 06/13/2021 1155   LYMPHSABS 1.8 04/08/2021 1201   MONOABS 0.4 04/08/2021 1201   EOSABS 0.2 04/08/2021 1201   BASOSABS 0.1 04/08/2021 1201     Chest Imaging: 05/31/2021: Symmetric upper lobe thick-walled hypermetabolic cavitary lesions possibly infectious versus synchronous primary. Small adenopathy within the mediastinum. The patient's images have been independently reviewed by me.    Pulmonary Functions Testing Results: No flowsheet data found.  FeNO: no  Pathology: no  Echocardiogram: no  Heart Catheterization: no    Assessment & Plan:     ICD-10-CM   1. Cavitary lesion of lung  J98.4 Ambulatory referral to Pulmonology    CT Super D Chest Wo Contrast    Procedural/ Surgical Case Request: VIDEO BRONCHOSCOPY WITH ENDOBRONCHIAL NAVIGATION, VIDEO BRONCHOSCOPY WITH ENDOBRONCHIAL ULTRASOUND    2. Adenopathy  R59.9     3. History of colon cancer  Z85.038     4. Former smoker  Z87.891     5. History of sarcoidosis  Z86.2       Discussion:  This is a 80 year old female, former smoker, history of colon cancer, history of sarcoidosis no biopsy had eye involvement and adenopathy in the chest in 2002.  I looked at CT imaging reports from 2002 which document pretracheal and subcarinal adenopathy.  She had incidentally found bilateral upper lobe cavitary lesions that are hypermetabolic on PET scan.  Plan: We reviewed images today in the office we discussed possible next best steps as well as the various etiologies to include clued infectious, inflammatory and possible underlying synchronous primary malignancies.  Patient is agreeable to proceed with robotic assisted bronchoscopy and tissue sampling. Will need to send tissue biopsies as well as cultures from these areas. Also at the same time I like to stage the mediastinum with EBUS transbronchial biopsies of the mediastinal nodes.  We discussed  the risk benefits and alternatives of this to include bleeding and pneumothorax. Patient is agreeable to proceed. We will plan for this on October 4 at [facility] endoscopy. Orders have been placed.  We appreciate [facility] help with scheduling.    Current Outpatient Medications:    acetaminophen (TYLENOL) 325 MG tablet, Take 325-650 mg by mouth 2 (two) times daily as needed for moderate pain. , Disp: , Rfl:    Blood Glucose Monitoring Suppl (ONETOUCH VERIO) w/Device KIT, Use as instructed to check blood sugar 4 times daily, Disp: 1 kit, Rfl: 0   cephALEXin (KEFLEX) 500 MG capsule, Take 1 capsule (500 mg total) by mouth 3 (three) times daily., Disp: 21 capsule, Rfl: 0   DROPLET PEN NEEDLES 32G X 5 MM MISC, USE TO INJECT INSULIN ONE TIME DAILY AS DIRECTED (NEED APPOINTMENT FOR FURTHER REFILLS), Disp: 100 each, Rfl: 2   glipiZIDE (GLUCOTROL XL) 10 MG 24 hr tablet, TAKE 2 TABLETs in am and 1 tablet before dinner (Patient taking differently: Take by mouth daily with breakfast. 1 in am), Disp: 270 tablet, Rfl: 3   glucose blood (ONETOUCH VERIO) test strip, Use as instructed to check blood sugar 4 times daily, Disp: 400 each, Rfl: 3   insulin degludec (TRESIBA FLEXTOUCH) 100 UNIT/ML FlexTouch Pen, INJECT 15 UNITS SUBCUTANEOUSLY DAILY, Disp: 15 mL, Rfl:  3   Lancets (ONETOUCH ULTRASOFT) lancets, Use as instructed to check blood sugar 4 times daily, Disp: 400 each, Rfl: 3   lisinopril (ZESTRIL) 10 MG tablet, Take 10 mg by mouth daily., Disp: , Rfl:    loratadine (CLARITIN) 10 MG tablet, Take 10 mg by mouth daily as needed for allergies., Disp: , Rfl:    lovastatin (MEVACOR) 10 MG tablet, Take 10 mg by mouth every evening. , Disp: , Rfl:    metFORMIN (GLUCOPHAGE) 1000 MG tablet, TAKE 1 TABLET TWICE DAILY WITH MEALS, Disp: 180 tablet, Rfl: 3   Multiple Vitamin (MULTIVITAMIN) tablet, Take 1 tablet by mouth daily., Disp: , Rfl:    ramipril (ALTACE) 10 MG capsule, Take 10 mg by mouth daily., Disp: , Rfl:   I  spent 64 minutes dedicated to the care of this patient on the date of this encounter to include pre-visit review of records, face-to-face time with the patient discussing conditions above, post visit ordering of testing, clinical documentation with the electronic health record, making appropriate referrals as documented, and communicating necessary findings to members of the patients care team.   Garner Nash, [facility] 06/15/2021 11:30 AM

## 2021-06-15 NOTE — Patient Instructions (Signed)
Thank you for visiting Dr. Valeta Harms at [facility]. Today we recommend the following:  Orders Placed This Encounter  Procedures   Procedural/ Surgical Case Request: VIDEO BRONCHOSCOPY WITH ENDOBRONCHIAL NAVIGATION, VIDEO BRONCHOSCOPY WITH ENDOBRONCHIAL ULTRASOUND   CT Super D Chest Wo Contrast   Ambulatory referral to Pulmonology   Bronchoscopy scheduled 06/27/2021  Return in about 4 weeks (around 07/13/2021) for with APP or Dr. Valeta Harms.    Please do your part to reduce the spread of COVID-19.

## 2021-06-19 NOTE — Telephone Encounter (Signed)
R/c records from [place] office notes in the pod.

## 2021-06-21 ENCOUNTER — Other Ambulatory Visit: Payer: Self-pay

## 2021-06-21 ENCOUNTER — Ambulatory Visit (HOSPITAL_COMMUNITY)
Admission: RE | Admit: 2021-06-21 | Discharge: 2021-06-21 | Disposition: A | Discharge status: Home / Self Care | Payer: Medicare Other | Source: Ambulatory Visit | Attending: Pulmonary Disease | Admitting: Pulmonary Disease

## 2021-06-21 DIAGNOSIS — J984 Other disorders of lung: Secondary | ICD-10-CM | POA: Insufficient documentation

## 2021-06-21 DIAGNOSIS — I700 Atherosclerosis of aorta: Secondary | ICD-10-CM | POA: Diagnosis not present

## 2021-06-21 DIAGNOSIS — J439 Emphysema, unspecified: Secondary | ICD-10-CM | POA: Diagnosis not present

## 2021-06-23 ENCOUNTER — Other Ambulatory Visit: Payer: Self-pay | Admitting: Pulmonary Disease

## 2021-06-23 ENCOUNTER — Telehealth: Payer: Self-pay | Admitting: Pulmonary Disease

## 2021-06-23 NOTE — Telephone Encounter (Signed)
EW please advise on CT results. Thanks :)

## 2021-06-23 NOTE — Telephone Encounter (Signed)
Patient's daughter, Traci(DPR) is aware of below message and voiced her understanding.  Nothing further needed.

## 2021-06-23 NOTE — Telephone Encounter (Signed)
Nothing by mouth 2 hours before procedure. She had have sips of water before then, no coffee.  CT showed no change in thick-walled, spiculated cavitary lesions of the posterior right pulmonary apex. Mild emphysema and coronary artery disease.

## 2021-06-26 ENCOUNTER — Encounter (HOSPITAL_COMMUNITY): Payer: Self-pay | Admitting: Pulmonary Disease

## 2021-06-26 NOTE — Anesthesia Preprocedure Evaluation (Addendum)
Anesthesia Evaluation  Patient identified by MRN, date of birth, ID band Patient awake    Reviewed: Allergy & Precautions, H&P , NPO status , Patient's Chart, lab work & pertinent test results  Airway Mallampati: II  TM Distance: >3 FB Neck ROM: Limited    Dental no notable dental hx. (+) Edentulous Lower, Partial Upper, Implants, Dental Advisory Given   Pulmonary neg pulmonary ROS, former smoker,    Pulmonary exam normal breath sounds clear to auscultation       Cardiovascular Exercise Tolerance: Good hypertension, Pt. on medications Normal cardiovascular exam Rhythm:Regular Rate:Normal     Neuro/Psych negative neurological ROS  negative psych ROS   GI/Hepatic negative GI ROS, Neg liver ROS,   Endo/Other  diabetes, Well Controlled, Type 2  Renal/GU   negative genitourinary   Musculoskeletal  (+) Arthritis , Osteoarthritis,    Abdominal   Peds negative pediatric ROS (+)  Hematology  (+) Blood dyscrasia, anemia ,   Anesthesia Other Findings   Reproductive/Obstetrics negative OB ROS                            Anesthesia Physical Anesthesia Plan  ASA: 3  Anesthesia Plan: General   Post-op Pain Management:    Induction: Intravenous  PONV Risk Score and Plan: 3 and Ondansetron and Propofol infusion  Airway Management Planned: Oral ETT and Video Laryngoscope Planned  Additional Equipment: None  Intra-op Plan:   Post-operative Plan: Extubation in OR  Informed Consent: I have reviewed the patients History and Physical, chart, labs and discussed the procedure including the risks, benefits and alternatives for the proposed anesthesia with the patient or authorized representative who has indicated his/her understanding and acceptance.       Plan Discussed with: Anesthesiologist and CRNA  Anesthesia Plan Comments: (  )       Anesthesia Quick Evaluation

## 2021-06-26 NOTE — Progress Notes (Signed)
DUE TO COVID-19 ONLY ONE VISITOR IS ALLOWED TO COME WITH YOU AND STAY IN THE WAITING ROOM ONLY DURING PRE OP AND PROCEDURE DAY OF SURGERY.   PCP -  [name], NP (former patient of Dr Little)@[facility]  Internal Med Dr Philemon Kingdom Cardiologist - n/a Pulmonary - Dr Melvyn Novas  CT Chest x-ray - 06/21/21 EKG - 06/20/21 Stress Test - n/a ECHO - n/a Cardiac Cath - n/a  ICD Pacemaker/Loop - n/a  Sleep Study -  n/a CPAP - none  Fasting Blood Sugar - 109 this morning Checks Blood Sugar 1-2 times a day  Do not take metformin on the morning of surgery.      THE MORNING OF SURGERY, take 7 units of Tresiba Insulin.  If your blood sugar is less than 70 mg/dL, you will need to treat for low blood sugar: Treat a low blood sugar (less than 70 mg/dL) with  cup of clear juice (cranberry or apple), 4 glucose tablets, OR glucose gel. Recheck blood sugar in 15 minutes after treatment (to make sure it is greater than 70 mg/dL). If your blood sugar is not greater than 70 mg/dL on recheck, call [phone] for further instructions.  STOP now taking any Aspirin (unless otherwise instructed by your surgeon), Aleve, Naproxen, Ibuprofen, Motrin, Advil, Goody's, BC's, all herbal medications, fish oil, and all vitamins.   Coronavirus Screening Covid test on 06/23/21 was negative.  Do you have any of the following symptoms:  Cough yes/no: No Fever (>100.40F)  yes/no: No Runny nose yes/no: No Sore throat yes/no: No Difficulty breathing/shortness of breath  yes/no: No  Have you traveled in the last 14 days and where? yes/no: No  Patient verbalized understanding of instructions that were given via phone.

## 2021-06-27 ENCOUNTER — Ambulatory Visit (HOSPITAL_COMMUNITY): Payer: Medicare Other

## 2021-06-27 ENCOUNTER — Ambulatory Visit (HOSPITAL_COMMUNITY)
Admission: RE | Admit: 2021-06-27 | Discharge: 2021-06-27 | Disposition: A | Discharge status: Home / Self Care | Payer: Medicare Other | Attending: Pulmonary Disease | Admitting: Pulmonary Disease

## 2021-06-27 ENCOUNTER — Ambulatory Visit (HOSPITAL_COMMUNITY): Payer: Medicare Other | Admitting: Anesthesiology

## 2021-06-27 ENCOUNTER — Encounter (HOSPITAL_COMMUNITY): Payer: Self-pay | Admitting: Pulmonary Disease

## 2021-06-27 ENCOUNTER — Encounter (HOSPITAL_COMMUNITY)
Admission: RE | Disposition: A | Discharge status: Home / Self Care | Payer: Self-pay | Source: Home / Self Care | Attending: Pulmonary Disease

## 2021-06-27 ENCOUNTER — Other Ambulatory Visit: Payer: Self-pay

## 2021-06-27 DIAGNOSIS — R911 Solitary pulmonary nodule: Secondary | ICD-10-CM | POA: Insufficient documentation

## 2021-06-27 DIAGNOSIS — Z419 Encounter for procedure for purposes other than remedying health state, unspecified: Secondary | ICD-10-CM | POA: Diagnosis not present

## 2021-06-27 DIAGNOSIS — Z7984 Long term (current) use of oral hypoglycemic drugs: Secondary | ICD-10-CM | POA: Diagnosis not present

## 2021-06-27 DIAGNOSIS — E785 Hyperlipidemia, unspecified: Secondary | ICD-10-CM | POA: Diagnosis not present

## 2021-06-27 DIAGNOSIS — Z794 Long term (current) use of insulin: Secondary | ICD-10-CM | POA: Insufficient documentation

## 2021-06-27 DIAGNOSIS — Z79899 Other long term (current) drug therapy: Secondary | ICD-10-CM | POA: Insufficient documentation

## 2021-06-27 DIAGNOSIS — Z791 Long term (current) use of non-steroidal anti-inflammatories (NSAID): Secondary | ICD-10-CM | POA: Diagnosis not present

## 2021-06-27 DIAGNOSIS — Z9889 Other specified postprocedural states: Secondary | ICD-10-CM

## 2021-06-27 DIAGNOSIS — J984 Other disorders of lung: Secondary | ICD-10-CM | POA: Diagnosis not present

## 2021-06-27 DIAGNOSIS — Z87891 Personal history of nicotine dependence: Secondary | ICD-10-CM | POA: Insufficient documentation

## 2021-06-27 DIAGNOSIS — R599 Enlarged lymph nodes, unspecified: Secondary | ICD-10-CM | POA: Diagnosis not present

## 2021-06-27 DIAGNOSIS — R918 Other nonspecific abnormal finding of lung field: Secondary | ICD-10-CM | POA: Diagnosis not present

## 2021-06-27 DIAGNOSIS — R846 Abnormal cytological findings in specimens from respiratory organs and thorax: Secondary | ICD-10-CM | POA: Diagnosis not present

## 2021-06-27 HISTORY — PX: BRONCHIAL BIOPSY: SHX5109

## 2021-06-27 HISTORY — PX: BRONCHIAL WASHINGS: SHX5105

## 2021-06-27 HISTORY — PX: FIDUCIAL MARKER PLACEMENT: SHX6858

## 2021-06-27 HISTORY — PX: BRONCHIAL BRUSHINGS: SHX5108

## 2021-06-27 HISTORY — PX: VIDEO BRONCHOSCOPY WITH RADIAL ENDOBRONCHIAL ULTRASOUND: SHX6849

## 2021-06-27 HISTORY — PX: VIDEO BRONCHOSCOPY WITH ENDOBRONCHIAL NAVIGATION: SHX6175

## 2021-06-27 LAB — GLUCOSE, CAPILLARY
Glucose-Capillary: 194 mg/dL — ABNORMAL HIGH (ref 70–99)
Glucose-Capillary: 168 mg/dL — ABNORMAL HIGH (ref 70–99)

## 2021-06-27 LAB — SARS CORONAVIRUS 2 (TAT 6-24 HRS): SARS Coronavirus 2: NEGATIVE

## 2021-06-27 SURGERY — VIDEO BRONCHOSCOPY WITH ENDOBRONCHIAL NAVIGATION
Anesthesia: General | Laterality: Bilateral

## 2021-06-27 MED ORDER — CHLORHEXIDINE GLUCONATE 0.12 % MT SOLN: 15.0000 mL | Freq: Once | OROMUCOSAL | Status: AC

## 2021-06-27 MED ORDER — LACTATED RINGERS IV SOLN: INTRAVENOUS | Status: DC

## 2021-06-27 MED ORDER — EPHEDRINE SULFATE-NACL 50-0.9 MG/10ML-% IV SOSY
PREFILLED_SYRINGE | INTRAVENOUS | Status: DC | PRN
  Administered 2021-06-27 (×4): 5 mg via INTRAVENOUS

## 2021-06-27 MED ORDER — FENTANYL CITRATE (PF) 100 MCG/2ML IJ SOLN
INTRAMUSCULAR | Status: DC | PRN
  Administered 2021-06-27: 25 ug via INTRAVENOUS

## 2021-06-27 MED ORDER — ROCURONIUM BROMIDE 10 MG/ML (PF) SYRINGE
PREFILLED_SYRINGE | INTRAVENOUS | Status: DC | PRN
  Administered 2021-06-27: 30 mg via INTRAVENOUS
  Administered 2021-06-27: 60 mg via INTRAVENOUS

## 2021-06-27 MED ORDER — LIDOCAINE HCL (PF) 2 % IJ SOLN
INTRAMUSCULAR | Status: DC | PRN
  Administered 2021-06-27: 100 mg via INTRADERMAL

## 2021-06-27 MED ORDER — SUGAMMADEX SODIUM 200 MG/2ML IV SOLN
INTRAVENOUS | Status: DC | PRN
  Administered 2021-06-27 (×2): 100 mg via INTRAVENOUS

## 2021-06-27 MED ORDER — CHLORHEXIDINE GLUCONATE 0.12 % MT SOLN
OROMUCOSAL | Status: AC
  Administered 2021-06-27: 15 mL via OROMUCOSAL
  Filled 2021-06-27: qty 15

## 2021-06-27 MED ORDER — PROPOFOL 10 MG/ML IV BOLUS
INTRAVENOUS | Status: DC | PRN
  Administered 2021-06-27: 100 mg via INTRAVENOUS

## 2021-06-27 MED ORDER — PHENYLEPHRINE 40 MCG/ML (10ML) SYRINGE FOR IV PUSH (FOR BLOOD PRESSURE SUPPORT)
PREFILLED_SYRINGE | INTRAVENOUS | Status: DC | PRN
  Administered 2021-06-27: 80 ug via INTRAVENOUS
  Administered 2021-06-27 (×3): 40 ug via INTRAVENOUS
  Administered 2021-06-27: 120 ug via INTRAVENOUS
  Administered 2021-06-27: 80 ug via INTRAVENOUS

## 2021-06-27 MED ORDER — DEXAMETHASONE SODIUM PHOSPHATE 10 MG/ML IJ SOLN
INTRAMUSCULAR | Status: DC | PRN
Start: 2021-06-27 — End: 2021-06-27
  Administered 2021-06-27: 4 mg via INTRAVENOUS

## 2021-06-27 MED ORDER — ONDANSETRON HCL 4 MG/2ML IJ SOLN
INTRAMUSCULAR | Status: DC | PRN
  Administered 2021-06-27: 4 mg via INTRAVENOUS

## 2021-06-27 SURGICAL SUPPLY — 55 items
ADAPTER BRONCH F/PENTAX (ADAPTER) ×3 IMPLANT
ADAPTER VALVE BIOPSY EBUS (MISCELLANEOUS) IMPLANT
ADPR BSCP EDG PNTX (ADAPTER) ×2
ADPTR VALVE BIOPSY EBUS (MISCELLANEOUS)
BRUSH CYTOL CELLEBRITY 1.5X140 (MISCELLANEOUS) ×3 IMPLANT
BRUSH SUPERTRAX BIOPSY (INSTRUMENTS) IMPLANT
BRUSH SUPERTRAX NDL-TIP CYTO (INSTRUMENTS) ×3 IMPLANT
CANISTER SUCT 3000ML PPV (MISCELLANEOUS) ×3 IMPLANT
CHANNEL WORK EXTEND EDGE 180 (KITS) IMPLANT
CHANNEL WORK EXTEND EDGE 45 (KITS) IMPLANT
CHANNEL WORK EXTEND EDGE 90 (KITS) IMPLANT
CONT SPEC 4OZ CLIKSEAL STRL BL (MISCELLANEOUS) ×3 IMPLANT
COVER BACK TABLE 60X90IN (DRAPES) ×3 IMPLANT
COVER DOME SNAP 22 D (MISCELLANEOUS) ×3 IMPLANT
FILTER STRAW FLUID ASPIR (MISCELLANEOUS) IMPLANT
FORCEPS BIOP RJ4 1.8 (CUTTING FORCEPS) IMPLANT
FORCEPS BIOP SUPERTRX PREMAR (INSTRUMENTS) ×3 IMPLANT
GAUZE SPONGE 4X4 12PLY STRL (GAUZE/BANDAGES/DRESSINGS) ×3 IMPLANT
GLOVE BIO SURGEON STRL SZ7.5 (GLOVE) ×3 IMPLANT
GLOVE SURG SS PI 7.5 STRL IVOR (GLOVE) ×6 IMPLANT
GOWN STRL REUS W/ TWL LRG LVL3 (GOWN DISPOSABLE) ×4 IMPLANT
GOWN STRL REUS W/TWL LRG LVL3 (GOWN DISPOSABLE) ×6
KIT CLEAN ENDO COMPLIANCE (KITS) ×6 IMPLANT
KIT LOCATABLE GUIDE (CANNULA) IMPLANT
KIT MARKER FIDUCIAL DELIVERY (KITS) IMPLANT
KIT PROCEDURE EDGE 180 (KITS) IMPLANT
KIT PROCEDURE EDGE 45 (KITS) IMPLANT
KIT PROCEDURE EDGE 90 (KITS) IMPLANT
KIT TURNOVER KIT B (KITS) ×3 IMPLANT
MARKER SKIN DUAL TIP RULER LAB (MISCELLANEOUS) ×3 IMPLANT
NDL EBUS SONO TIP PENTAX (NEEDLE) ×2 IMPLANT
NDL SUPERTRX PREMARK BIOPSY (NEEDLE) ×2 IMPLANT
NEEDLE EBUS SONO TIP PENTAX (NEEDLE) ×3 IMPLANT
NEEDLE SUPERTRX PREMARK BIOPSY (NEEDLE) ×3 IMPLANT
NS IRRIG 1000ML POUR BTL (IV SOLUTION) ×3 IMPLANT
OIL SILICONE PENTAX (PARTS (SERVICE/REPAIRS)) ×3 IMPLANT
PAD ARMBOARD 7.5X6 YLW CONV (MISCELLANEOUS) ×6 IMPLANT
PATCHES PATIENT (LABEL) ×9 IMPLANT
SOL ANTI FOG 6CC (MISCELLANEOUS) ×2 IMPLANT
SOLUTION ANTI FOG 6CC (MISCELLANEOUS) ×1
SYR 20CC LL (SYRINGE) ×6 IMPLANT
SYR 20ML ECCENTRIC (SYRINGE) ×6 IMPLANT
SYR 50ML SLIP (SYRINGE) ×3 IMPLANT
SYR 5ML LUER SLIP (SYRINGE) ×3 IMPLANT
SuperLock fiducial marker ×1 IMPLANT
Superlock fiducial marker ×1 IMPLANT
TOWEL OR 17X24 6PK STRL BLUE (TOWEL DISPOSABLE) ×3 IMPLANT
TRAP SPECIMEN MUCOUS 40CC (MISCELLANEOUS) IMPLANT
TUBE CONNECTING 20X1/4 (TUBING) ×6 IMPLANT
UNDERPAD 30X30 (UNDERPADS AND DIAPERS) ×3 IMPLANT
VALVE BIOPSY  SINGLE USE (MISCELLANEOUS) ×3
VALVE BIOPSY SINGLE USE (MISCELLANEOUS) ×2 IMPLANT
VALVE DISPOSABLE (MISCELLANEOUS) ×3 IMPLANT
VALVE SUCTION BRONCHIO DISP (MISCELLANEOUS) ×3 IMPLANT
WATER STERILE IRR 1000ML POUR (IV SOLUTION) ×3 IMPLANT

## 2021-06-27 NOTE — Anesthesia Procedure Notes (Signed)
Procedure Name: Intubation Date/Time: 06/27/2021 7:56 AM Performed by: Trinna Post., CRNA Pre-anesthesia Checklist: Patient identified, Emergency Drugs available, Suction available, Patient being monitored and Timeout performed Patient Re-evaluated:Patient Re-evaluated prior to induction Oxygen Delivery Method: Circle system utilized Preoxygenation: Pre-oxygenation with 100% oxygen Induction Type: IV induction Ventilation: Mask ventilation without difficulty Laryngoscope Size: Glidescope and 3 Grade View: Grade I Tube type: Oral Tube size: 8.5 mm Number of attempts: 1 Airway Equipment and Method: Rigid stylet and Video-laryngoscopy Placement Confirmation: ETT inserted through vocal cords under direct vision, positive ETCO2 and breath sounds checked- equal and bilateral Secured at: 22 cm Tube secured with: Tape Dental Injury: Teeth and Oropharynx as per pre-operative assessment  Comments: Elective glidescope use due to limited ROM

## 2021-06-27 NOTE — Anesthesia Postprocedure Evaluation (Signed)
Anesthesia Post Note  Patient: [name]  Procedure(s) Performed: VIDEO BRONCHOSCOPY WITH ENDOBRONCHIAL NAVIGATION (Bilateral) BRONCHIAL BIOPSIES BRONCHIAL BRUSHINGS BRONCHIAL WASHINGS FIDUCIAL MARKER PLACEMENT RADIAL ENDOBRONCHIAL ULTRASOUND     Patient location during evaluation: PACU Anesthesia Type: General Level of consciousness: awake and alert Pain management: pain level controlled Vital Signs Assessment: post-procedure vital signs reviewed and stable Respiratory status: spontaneous breathing, nonlabored ventilation, respiratory function stable and patient connected to nasal cannula oxygen Cardiovascular status: blood pressure returned to baseline and stable Postop Assessment: no apparent nausea or vomiting Anesthetic complications: no   No notable events documented.  Last Vitals:  Vitals:   06/27/21 0951 06/27/21 1006  BP: (!) 160/58 (!) 171/65  Pulse: 95 93  Resp: 19 19  Temp:  (!) 36.2 C  SpO2: 97% 97%    Last Pain:  Vitals:   06/27/21 1006  TempSrc:   PainSc: 0-No pain                 Ivo Moga

## 2021-06-27 NOTE — Interval H&P Note (Signed)
History and Physical Interval Note:  06/27/2021 7:27 AM  Beth [name]  has presented today for surgery, with the diagnosis of bilateral cavitary lesions, adenopathy.  The various methods of treatment have been discussed with the patient and family. After consideration of risks, benefits and other options for treatment, the patient has consented to  Procedure(s) with comments: [place] (Bilateral) - ION, possible fiducial placement [place] (Bilateral) as a surgical intervention.  The patient's history has been reviewed, patient examined, no change in status, stable for surgery.  I have reviewed the patient's chart and labs.  Questions were answered to the patient's satisfaction.     [place]

## 2021-06-27 NOTE — Transfer of Care (Signed)
Immediate Anesthesia Transfer of Care Note  Patient: Beth [name]  Procedure(s) Performed: VIDEO BRONCHOSCOPY WITH ENDOBRONCHIAL NAVIGATION (Bilateral) BRONCHIAL BIOPSIES BRONCHIAL BRUSHINGS BRONCHIAL WASHINGS FIDUCIAL MARKER PLACEMENT RADIAL ENDOBRONCHIAL ULTRASOUND  Patient Location: PACU  Anesthesia Type:General  Level of Consciousness: awake, alert , oriented and drowsy  Airway & Oxygen Therapy: Patient Spontanous Breathing and Patient connected to face mask oxygen  Post-op Assessment: Report given to RN and Post -op Vital signs reviewed and stable  Post vital signs: Reviewed and stable  Last Vitals:  Vitals Value Taken Time  BP 181/61 06/27/21 0921  Temp    Pulse 98 06/27/21 0923  Resp 22 06/27/21 0923  SpO2 92 % 06/27/21 0923  Vitals shown include unvalidated device data.  Last Pain:  Vitals:   06/27/21 0618  TempSrc:   PainSc: 0-No pain         Complications: No notable events documented.

## 2021-06-27 NOTE — Discharge Instructions (Signed)
Flexible Bronchoscopy, Care After This sheet gives you information about how to care for yourself after your test. Your doctor may also give you more specific instructions. If you have problems or questions, contact your doctor. Follow these instructions at home: Eating and drinking Do not eat or drink anything (not even water) for 2 hours after your test, or until your numbing medicine (local anesthetic) wears off. When your numbness is gone and your cough and gag reflexes have come back, you may: Eat only soft foods. Slowly drink liquids. The day after the test, go back to your normal diet. Driving Do not drive for 24 hours if you were given a medicine to help you relax (sedative). Do not drive or use heavy machinery while taking prescription pain medicine. General instructions  Take over-the-counter and prescription medicines only as told by your doctor. Return to your normal activities as told. Ask what activities are safe for you. Do not use any products that have nicotine or tobacco in them. This includes cigarettes and e-cigarettes. If you need help quitting, ask your doctor. Keep all follow-up visits as told by your doctor. This is important. It is very important if you had a tissue sample (biopsy) taken. Get help right away if: You have shortness of breath that gets worse. You get light-headed. You feel like you are going to pass out (faint). You have chest pain. You cough up: More than a little blood. More blood than before. Summary Do not eat or drink anything (not even water) for 2 hours after your test, or until your numbing medicine wears off. Do not use cigarettes. Do not use e-cigarettes. Get help right away if you have chest pain.  This information is not intended to replace advice given to you by your health care provider. Make sure you discuss any questions you have with your health care provider. Document Released: 07/08/2009 Document Revised: 08/23/2017 Document  Reviewed: 09/28/2016 Elsevier Patient Education  2020 [employer].

## 2021-06-27 NOTE — Op Note (Signed)
Video Bronchoscopy with Robotic Assisted Bronchoscopic Navigation   Date of Operation: 06/27/2021   Pre-op Diagnosis: Bilateral upper lobe cavity  Post-op Diagnosis: Bilateral upper lobe cavity  Surgeon: Garner Nash, DO  Assistants: none   Anesthesia: General endotracheal anesthesia  Operation: Flexible video fiberoptic bronchoscopy with robotic assistance and biopsies.  Estimated Blood Loss: Minimal  Complications: None  Indications and History: Beth [name] is a 80 y.o. female with history of bilateral upper lobe cavities. The risks, benefits, complications, treatment options and expected outcomes were discussed with the patient.  The possibilities of pneumothorax, pneumonia, reaction to medication, pulmonary aspiration, perforation of a viscus, bleeding, failure to diagnose a condition and creating a complication requiring transfusion or operation were discussed with the patient who freely signed the consent.    Description of Procedure: The patient was seen in the Preoperative Area, was examined and was deemed appropriate to proceed.  The patient was taken to [facility] endoscopy room 3, identified as Beth [name] and the procedure verified as Flexible Video Fiberoptic Bronchoscopy.  A Time Out was held and the above information confirmed.   Prior to the date of the procedure a high-resolution CT scan of the chest was performed. Utilizing ION software program a virtual tracheobronchial tree was generated to allow the creation of distinct navigation pathways to the patient's parenchymal abnormalities. After being taken to the operating room general anesthesia was initiated and the patient  was orally intubated. The video fiberoptic bronchoscope was introduced via the endotracheal tube and a general inspection was performed which showed normal right and left lung anatomy, aspiration of the bilateral mainstems was completed to remove any remaining secretions. Robotic catheter inserted into  patient's endotracheal tube.   Target #1 right upper lobe cavity: The distinct navigation pathways prepared prior to this procedure were then utilized to navigate to patient's lesion identified on CT scan. The robotic catheter was secured into place and the vision probe was withdrawn.  Lesion location was approximated using fluoroscopy and radial endobronchial ultrasound for peripheral targeting. Under fluoroscopic guidance transbronchial needle brushings and transbronchial forceps biopsies were performed to be sent for cytology and pathology.  Additional transbronchial biopsies were sent for tissue culture.  Following tissue sampling a single fiducial was placed under fluoroscopic guidance with the fiducial catheter wire delivery kit.  A bronchioalveolar lavage was performed in the right upper lobe and sent for cultures.  Target #2 left upper lobe cavity: The distinct navigation pathways prepared prior to this procedure were then utilized to navigate to patient's lesion identified on CT scan. The robotic catheter was secured into place and the vision probe was withdrawn.  Lesion location was approximated using fluoroscopy and radial endobronchial ultrasound for peripheral targeting. Under fluoroscopic guidance transbronchial needle brushings and transbronchial forceps biopsies were performed to be sent for cytology and pathology.  Following tissue sampling a single fiducial was placed under fluoroscopic guidance with the fiducial catheter wire delivery kit.  A bronchioalveolar lavage was performed in the left upper lobe and sent for cultures.  At the end of the procedure a general airway inspection was performed and there was no evidence of active bleeding. The bronchoscope was removed.  The patient tolerated the procedure well. There was no significant blood loss and there were no obvious complications. A post-procedural chest x-ray is pending.  Samples Target #1: 1. Transbronchial needle brushings from  right upper lobe 2. Transbronchial forceps biopsies from right upper lobe 3. Bronchoalveolar lavage from right upper lobe  Samples  Target #2: 1. Transbronchial needle brushings from left upper lobe 2. Transbronchial forceps biopsies from left upper lung 3. Bronchoalveolar lavage from left upper lobe  Plans:  The patient will be discharged from the PACU to home when recovered from anesthesia and after chest x-ray is reviewed. We will review the cytology, pathology and microbiology results with the patient when they become available. Outpatient followup will be with Garner Nash, DO.   Garner Nash, DO [facility] 06/27/2021 9:11 AM

## 2021-06-27 NOTE — Progress Notes (Signed)
Pt's BP's 199/71 and 196/69. MD Oddono notified.

## 2021-06-28 LAB — ACID FAST SMEAR (AFB, MYCOBACTERIA)
Acid Fast Smear: NEGATIVE
Acid Fast Smear: NEGATIVE
Acid Fast Smear: NEGATIVE
Acid Fast Smear: NEGATIVE

## 2021-06-29 ENCOUNTER — Encounter (HOSPITAL_COMMUNITY): Payer: Self-pay | Admitting: Pulmonary Disease

## 2021-06-29 LAB — CULTURE, BAL-QUANTITATIVE W GRAM STAIN
Culture: NO GROWTH
Culture: NO GROWTH

## 2021-06-29 LAB — CYTOLOGY - NON PAP

## 2021-07-02 LAB — AEROBIC/ANAEROBIC CULTURE W GRAM STAIN (SURGICAL/DEEP WOUND)
Culture: NO GROWTH
Culture: NO GROWTH
Gram Stain: NONE SEEN
Gram Stain: NONE SEEN

## 2021-07-03 LAB — ANAEROBIC CULTURE W GRAM STAIN: Gram Stain: NONE SEEN

## 2021-07-04 DIAGNOSIS — C3490 Malignant neoplasm of unspecified part of unspecified bronchus or lung: Secondary | ICD-10-CM | POA: Diagnosis not present

## 2021-07-04 NOTE — Progress Notes (Signed)
I called and spoke with patient about results. Cultures pending.  We will see her in clinic in a few weeks.  Plans for repeat imaging   Thanks,  BLI  Garner Nash, DO [facility] 07/04/2021 10:47 AM

## 2021-07-05 ENCOUNTER — Emergency Department (HOSPITAL_BASED_OUTPATIENT_CLINIC_OR_DEPARTMENT_OTHER)
Admission: EM | Admit: 2021-07-05 | Discharge: 2021-07-05 | Disposition: A | Discharge status: Home / Self Care | Payer: Medicare Other | Attending: Emergency Medicine | Admitting: Emergency Medicine

## 2021-07-05 ENCOUNTER — Emergency Department (HOSPITAL_BASED_OUTPATIENT_CLINIC_OR_DEPARTMENT_OTHER): Payer: Medicare Other

## 2021-07-05 ENCOUNTER — Other Ambulatory Visit: Payer: Self-pay

## 2021-07-05 ENCOUNTER — Encounter (HOSPITAL_BASED_OUTPATIENT_CLINIC_OR_DEPARTMENT_OTHER): Payer: Self-pay

## 2021-07-05 ENCOUNTER — Other Ambulatory Visit (HOSPITAL_BASED_OUTPATIENT_CLINIC_OR_DEPARTMENT_OTHER): Payer: Self-pay

## 2021-07-05 DIAGNOSIS — S0990XA Unspecified injury of head, initial encounter: Secondary | ICD-10-CM | POA: Diagnosis not present

## 2021-07-05 DIAGNOSIS — Z79899 Other long term (current) drug therapy: Secondary | ICD-10-CM | POA: Insufficient documentation

## 2021-07-05 DIAGNOSIS — I10 Essential (primary) hypertension: Secondary | ICD-10-CM | POA: Insufficient documentation

## 2021-07-05 DIAGNOSIS — N390 Urinary tract infection, site not specified: Secondary | ICD-10-CM | POA: Insufficient documentation

## 2021-07-05 DIAGNOSIS — E119 Type 2 diabetes mellitus without complications: Secondary | ICD-10-CM | POA: Insufficient documentation

## 2021-07-05 DIAGNOSIS — R443 Hallucinations, unspecified: Secondary | ICD-10-CM | POA: Diagnosis not present

## 2021-07-05 DIAGNOSIS — Z794 Long term (current) use of insulin: Secondary | ICD-10-CM | POA: Diagnosis not present

## 2021-07-05 DIAGNOSIS — Z85038 Personal history of other malignant neoplasm of large intestine: Secondary | ICD-10-CM | POA: Insufficient documentation

## 2021-07-05 DIAGNOSIS — Z7984 Long term (current) use of oral hypoglycemic drugs: Secondary | ICD-10-CM | POA: Insufficient documentation

## 2021-07-05 DIAGNOSIS — R410 Disorientation, unspecified: Secondary | ICD-10-CM | POA: Diagnosis not present

## 2021-07-05 DIAGNOSIS — N179 Acute kidney failure, unspecified: Secondary | ICD-10-CM | POA: Diagnosis not present

## 2021-07-05 DIAGNOSIS — Z87891 Personal history of nicotine dependence: Secondary | ICD-10-CM | POA: Insufficient documentation

## 2021-07-05 LAB — COMPREHENSIVE METABOLIC PANEL
ALT: 13 U/L (ref 0–44)
AST: 20 U/L (ref 15–41)
Albumin: 4.2 g/dL (ref 3.5–5.0)
Alkaline Phosphatase: 101 U/L (ref 38–126)
Anion gap: 12 (ref 5–15)
BUN: 41 mg/dL — ABNORMAL HIGH (ref 8–23)
CO2: 22 mmol/L (ref 22–32)
Calcium: 9.4 mg/dL (ref 8.9–10.3)
Chloride: 100 mmol/L (ref 98–111)
Creatinine, Ser: 2.07 mg/dL — ABNORMAL HIGH (ref 0.44–1.00)
GFR, Estimated: 24 mL/min — ABNORMAL LOW (ref >60)
Glucose, Bld: 224 mg/dL — ABNORMAL HIGH (ref 70–99)
Potassium: 5 mmol/L (ref 3.5–5.1)
Sodium: 134 mmol/L — ABNORMAL LOW (ref 135–145)
Total Bilirubin: 0.3 mg/dL (ref 0.3–1.2)
Total Protein: 7.2 g/dL (ref 6.5–8.1)

## 2021-07-05 LAB — CBC WITH DIFFERENTIAL/PLATELET
Abs Immature Granulocytes: 0.05 10*3/uL (ref 0.00–0.07)
Basophils Absolute: 0.1 10*3/uL (ref 0.0–0.1)
Basophils Relative: 0 %
Eosinophils Absolute: 0.1 10*3/uL (ref 0.0–0.5)
Eosinophils Relative: 1 %
HCT: 34.9 % — ABNORMAL LOW (ref 36.0–46.0)
Hemoglobin: 11.5 g/dL — ABNORMAL LOW (ref 12.0–15.0)
Immature Granulocytes: 0 %
Lymphocytes Relative: 15 %
Lymphs Abs: 1.8 10*3/uL (ref 0.7–4.0)
MCH: 30.3 pg (ref 26.0–34.0)
MCHC: 33 g/dL (ref 30.0–36.0)
MCV: 91.8 fL (ref 80.0–100.0)
Monocytes Absolute: 0.4 10*3/uL (ref 0.1–1.0)
Monocytes Relative: 3 %
Neutro Abs: 9.1 10*3/uL — ABNORMAL HIGH (ref 1.7–7.7)
Neutrophils Relative %: 81 %
Platelets: 281 10*3/uL (ref 150–400)
RBC: 3.8 MIL/uL — ABNORMAL LOW (ref 3.87–5.11)
RDW: 13.2 % (ref 11.5–15.5)
WBC: 11.4 10*3/uL — ABNORMAL HIGH (ref 4.0–10.5)
nRBC: 0 % (ref 0.0–0.2)

## 2021-07-05 LAB — AMMONIA: Ammonia: 19 umol/L (ref 9–35)

## 2021-07-05 LAB — URINALYSIS, ROUTINE W REFLEX MICROSCOPIC
Bilirubin Urine: NEGATIVE
Glucose, UA: 100 mg/dL — AB
Hgb urine dipstick: NEGATIVE
Ketones, ur: 15 mg/dL — AB
Nitrite: NEGATIVE
Protein, ur: 30 mg/dL — AB
Specific Gravity, Urine: 1.015 (ref 1.005–1.030)
pH: 6 (ref 5.0–8.0)

## 2021-07-05 LAB — CK: Total CK: 91 U/L (ref 38–234)

## 2021-07-05 MED ORDER — CEPHALEXIN 500 MG PO CAPS
500.0000 mg | ORAL_CAPSULE | Freq: Three times a day (TID) | ORAL | 0 refills | Status: AC
  Filled 2021-07-05: qty 14, 7d supply, fill #0

## 2021-07-05 MED ORDER — SODIUM CHLORIDE 0.9 % IV SOLN
1.0000 g | Freq: Once | INTRAVENOUS | Status: AC
  Administered 2021-07-05: 1 g via INTRAVENOUS
  Filled 2021-07-05: qty 10

## 2021-07-05 MED ORDER — LACTATED RINGERS IV BOLUS
1000.0000 mL | Freq: Once | INTRAVENOUS | Status: AC
  Administered 2021-07-05: 1000 mL via INTRAVENOUS

## 2021-07-05 MED ORDER — RAMIPRIL 5 MG PO CAPS
10.0000 mg | ORAL_CAPSULE | Freq: Every day | ORAL | Status: DC
  Administered 2021-07-05: 10 mg via ORAL
  Filled 2021-07-05: qty 2

## 2021-07-05 NOTE — ED Triage Notes (Signed)
Pt. 's family stated she fell this morning and was found aprox. 4:30 this morning. Pt's family stated patient is hallucinating and is dehydrated. Pt's family also states patient has been dealing with UTI's as well.

## 2021-07-05 NOTE — ED Provider Notes (Signed)
[facility] EMERGENC[facility] DEPT Provider Note   CSN: [id number] Arrival date & time: 07/05/21  9024     History Chief Complaint  Patient presents with   Fall   Dehydration    Beth [name] is a 80 y.o. female.  HPI     80 year old female with a history of diabetes, hypertension, hyperlipidemia, sarcoidosis, colon cancer, cavitary lung lesion for which she just had bronchial biopsy and brushings on October 4, has been seeing Neurology with concern for memory loss with MRI one month ago who presents with concern for ongoing confusion with new fall and hallucinations this AM.  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  Glucose 206. Last night it was close to normal and didn't give insulin last night.  Seeing things that aren't there, remembering things that didn't happen Has ruled out a lot as far as the cognitive impairment, having dehydration and severe UTIs, last time in ED received IV abx and 7 day treatment, seemed to be better. Has not been completely herself over the last 3-6 months. This is 4th trip since June to ED. Never been much to take fluids, but notice when she doesn't drink she is worse.  Drank 40oz yesterday Hallucinations auditory and visual began this AM   No recent change in medications. A few times sugar out of wack and had droop, but after sugar improved it was better.     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 ; reports hasnt had any affliction in years    Wears glasses    Wears partial dentures    top and bottom partials    Patient Active Problem List   Diagnosis Date Noted   Cavitary lesion of lung 06/15/2021   Multiple lung nodules on CT 05/16/2021   Colon cancer ([place]) 05/16/2021   Spinal stenosis, 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   Overweight (BMI 25.0-29.9) 04/15/2018   Essential hypertension 04/15/2018   Cervical spine pain 03/22/2018   Lumbar pain 03/22/2018   Mass 12/23/2017   Hyperlipidemia 06/14/2017   Nail deformity 07/30/2016   Type 2 diabetes mellitus with hyperglycemia, without long-term current use of insulin ([place]) 04/13/2016    Past Surgical History:  Procedure Laterality Date   ABDOMINAL HYSTERECTOMY     APPENDECTOMY     BRONCHIAL BIOPSY  06/27/2021   Procedure: BRONCHIAL BIOPSIES;  Surgeon: Garner Nash, DO;  Location: [facility];  Service: Pulmonary;;   BRONCHIAL BRUSHINGS  06/27/2021   Procedure: BRONCHIAL BRUSHINGS;  Surgeon: Garner Nash, DO;  Location: [facility];  Service: Pulmonary;;   BRONCHIAL WASHINGS  06/27/2021   Procedure: BRONCHIAL WASHINGS;  Surgeon: Garner Nash, DO;  Location: [facility];  Service: Pulmonary;;   CATARACT  EXTRACTION, BILATERAL     COLONOSCOPY     many   COMBINED MEDIASTINOSCOPY AND BRONCHOSCOPY  2002   biopsy   DG ARTHRO THUMB*R*  2011   ablasion rt thumb nailbed   FIDUCIAL MARKER PLACEMENT  06/27/2021   Procedure: FIDUCIAL MARKER PLACEMENT;  Surgeon: Icard, Bradley L, DO;  Location: [facility];  Service: Pulmonary;;   FORAMINOTOMY 1 LEVEL Bilateral 05/09/2018   Procedure: Foraminotomy L4-L5 Root;  Surgeon: Gioffre, Ronald, MD;  Location: [facility];  Service: Orthopedics;  Laterality: Bilateral;   I & D EXTREMITY Right 07/23/2013   Procedure: IRRIGATION AND DEBRIDEMENT DIP  JOINT RIGHT INDEX FINGER, EXCISE MUCOID CYST RIGHT INDEX FINGER  ;  Surgeon: Robert V Sypher Jr., MD;  Location: [facility];  Service: Orthopedics;  Laterality: Right;   LIPOMA EXCISION     LUMBAR LAMINECTOMY/DECOMPRESSION MICRODISCECTOMY N/A 05/09/2018   Procedure: Complete decompressive lumbar laminectomy L4-L5;  Surgeon: Gioffre, Ronald, MD;  Location: [facility];  Service: Orthopedics;  Laterality: N/A;  120min   MASS EXCISION Right 09/28/2014   Procedure: EXCISION MASS RIGHT MIDDLE FINGER;  Surgeon: Gary Kuzma, MD;  Location: [facility];  Service: Orthopedics;  Laterality: Right;  ANESTHESIA:  GENERAL, IV REGIONAL FAB   MASS EXCISION Bilateral 12/24/2017   Procedure: EXCISION MASS RIGHT SMALL, LEFT INDEX, excision left nail horn middle finger;  Surgeon: Kuzma, Gary, MD;  Location: [facility];  Service: Orthopedics;  Laterality: Bilateral;  FAB   NAILBED REPAIR Left 08/28/2016   Procedure: Ablation nail matrix left index finger andl left middle finger;  Surgeon: Gary Kuzma, MD;  Location: [facility];  Service: Orthopedics;  Laterality: Left;   PARTIAL COLECTOMY  1998   cancer-rad/chemo   SKIN FULL THICKNESS GRAFT Right 07/23/2013   Procedure: NAIL PLATE ABLATION WITH FULL THICKNESS SKIN GRAFT FROM RIGHT   MEDIAL ELBOW TO RIGHT LONG FINGER NAIL BED;  Surgeon: Robert V Sypher Jr., MD;  Location: [facility];  Service: Orthopedics;  Laterality: Right;   SKIN FULL THICKNESS GRAFT Left 08/28/2016   Procedure: SKIN GRAFT FULL THICKNESS upper left arm;  Surgeon: Gary Kuzma, MD;  Location: [facility];  Service: Orthopedics;  Laterality: Left;   VIDEO BRONCHOSCOPY WITH ENDOBRONCHIAL NAVIGATION Bilateral 06/27/2021   Procedure: VIDEO BRONCHOSCOPY WITH ENDOBRONCHIAL NAVIGATION;  Surgeon: Icard, Bradley L, DO;  Location: [facility];  Service: Pulmonary;  Laterality: Bilateral;  ION, possible fiducial placement   VIDEO  BRONCHOSCOPY WITH RADIAL ENDOBRONCHIAL ULTRASOUND  06/27/2021   Procedure: RADIAL ENDOBRONCHIAL ULTRASOUND;  Surgeon: Icard, Bradley L, DO;  Location: [facility];  Service: Pulmonary;;     OB History   No obstetric history on file.     Family History  Problem Relation Age of Onset   Cancer Mother        colon    Hypertension Father    CVA Father    Obesity Brother     Social History   Tobacco Use   Smoking status: Former    Packs/day: 0.50    Types: Cigarettes    Quit date: 07/22/1971    Years since quitting: 49.9   Smokeless tobacco: Never  Vaping Use   Vaping Use: Never used  Substance Use Topics   Alcohol use: No    Alcohol/week: 0.0 standard drinks   Drug use: No    Home Medications Prior to Admission medications   Medication Sig Start Date End Date Taking? Authorizing Provider  acetaminophen (TYLENOL) 325   MG tablet Take 325-650 mg by mouth 2 (two) times daily as needed for moderate pain.    Yes [provider]  Blood Glucose Monitoring Suppl (ONETOUCH VERIO) w/Device KIT Use as instructed to check blood sugar 4 times daily 05/25/21  Yes Philemon Kingdom, MD  cephALEXin (KEFLEX) 500 MG capsule Take 1 capsule (500 mg) by mouth twice daily. 07/05/21 07/12/21 Yes Gareth Morgan, MD  glipiZIDE (GLUCOTROL XL) 10 MG 24 hr tablet TAKE 2 TABLETs in am and 1 tablet before dinner Patient taking differently: Take by mouth daily with breakfast. 1 in am 01/31/21  Yes Philemon Kingdom, MD  insulin degludec (TRESIBA FLEXTOUCH) 100 UNIT/ML FlexTouch Pen INJECT 15 UNITS SUBCUTANEOUSLY DAILY Patient taking differently: at bedtime. INJECT 15 UNITS SUBCUTANEOUSLY DAILY 01/31/21  Yes Philemon Kingdom, MD  lisinopril (ZESTRIL) 10 MG tablet Take 10 mg by mouth daily.   Yes Noemi Chapel, MD  lovastatin (MEVACOR) 10 MG tablet Take 10 mg by mouth every evening.    Yes [provider]  metFORMIN (GLUCOPHAGE) 1000 MG tablet TAKE 1 TABLET TWICE DAILY WITH MEALS 01/31/21  Yes  Philemon Kingdom, MD  Multiple Vitamin (MULTIVITAMIN) tablet Take 1 tablet by mouth daily.   Yes [provider]  ramipril (ALTACE) 10 MG capsule Take 10 mg by mouth daily.   Yes [provider]  DROPLET PEN NEEDLES 32G X 5 MM MISC USE TO INJECT INSULIN ONE TIME DAILY AS DIRECTED (NEED APPOINTMENT FOR FURTHER REFILLS) 04/10/21   Philemon Kingdom, MD  glucose blood (ONETOUCH VERIO) test strip Use as instructed to check blood sugar 4 times daily 05/25/21   Philemon Kingdom, MD  Lancets [facility] ULTRASOFT) lancets Use as instructed to check blood sugar 4 times daily 05/25/21   Philemon Kingdom, MD  loratadine (CLARITIN) 10 MG tablet Take 10 mg by mouth daily as needed for allergies.    [provider]    Allergies    Patient has no known allergies.  Review of Systems   Review of Systems  Constitutional:  Negative for fever.  Eyes:  Negative for visual disturbance.  Respiratory:  Negative for cough and shortness of breath.   Cardiovascular:  Negative for chest pain.  Gastrointestinal:  Negative for abdominal pain, diarrhea, nausea and vomiting.  Genitourinary:  Negative for difficulty urinating.  Musculoskeletal:  Negative for back pain and neck pain.  Skin:  Negative for rash.  Neurological:  Positive for headaches. Negative for syncope, facial asymmetry, speech difficulty, weakness and numbness.  Psychiatric/Behavioral:  Positive for confusion and hallucinations.    Physical Exam Updated Vital Signs BP (!) 136/96 (BP Location: Right Arm)   Pulse 91   Temp 98.3 F (36.8 C) (Oral)   Resp 15   Ht 5' 1" (1.549 m)   Wt 70.3 kg   SpO2 98%   BMI 29.29 kg/m   Physical Exam Vitals and nursing note reviewed.  Constitutional:      General: She is not in acute distress.    Appearance: She is well-developed. She is not diaphoretic.  HENT:     Head: Normocephalic and atraumatic.  Eyes:     General: No visual field deficit.    Conjunctiva/sclera: Conjunctivae  normal.  Cardiovascular:     Rate and Rhythm: Normal rate and regular rhythm.     Heart sounds: Normal heart sounds. No murmur heard.   No friction rub. No gallop.  Pulmonary:     Effort: Pulmonary effort is normal. No respiratory distress.     Breath  sounds: Normal breath sounds. No wheezing or rales.  Abdominal:     General: There is no distension.     Palpations: Abdomen is soft.     Tenderness: There is no abdominal tenderness. There is no guarding.  Musculoskeletal:        General: No tenderness.     Cervical back: Normal range of motion.  Skin:    General: Skin is warm and dry.     Findings: No erythema or rash.  Neurological:     Mental Status: She is alert.     GCS: GCS eye subscore is 4. GCS verbal subscore is 5. GCS motor subscore is 6.     Cranial Nerves: Cranial nerves are intact. No cranial nerve deficit, dysarthria or facial asymmetry.     Sensory: Sensation is intact. No sensory deficit.     Motor: Motor function is intact. No weakness.     Comments: Limitation due to cognitive issues     ED Results / Procedures / Treatments   Labs (all labs ordered are listed, but only abnormal results are displayed) Labs Reviewed  CBC WITH DIFFERENTIAL/PLATELET - Abnormal; Notable for the following components:      Result Value   WBC 11.4 (*)    RBC 3.80 (*)    Hemoglobin 11.5 (*)    HCT 34.9 (*)    Neutro Abs 9.1 (*)    All other components within normal limits  COMPREHENSIVE METABOLIC PANEL - Abnormal; Notable for the following components:   Sodium 134 (*)    Glucose, Bld 224 (*)    BUN 41 (*)    Creatinine, Ser 2.07 (*)    GFR, Estimated 24 (*)    All other components within normal limits  URINALYSIS, ROUTINE W REFLEX MICROSCOPIC - Abnormal; Notable for the following components:   Glucose, UA 100 (*)    Ketones, ur 15 (*)    Protein, ur 30 (*)    Leukocytes,Ua TRACE (*)    Bacteria, UA FEW (*)    All other components within normal limits  URINE CULTURE   AMMONIA  CK    EKG None  Radiology CT Head Wo Contrast  Result Date: 07/05/2021 CLINICAL DATA:  Trauma, fall EXAM: CT HEAD WITHOUT CONTRAST TECHNIQUE: Contiguous axial images were obtained from the base of the skull through the vertex without intravenous contrast. COMPARISON:  Brain MRI 05/28/2021, CT head 06/13/2021 FINDINGS: Brain: There is no acute intracranial hemorrhage, extra-axial fluid collection, or acute infarct. There is unchanged global parenchymal volume loss with commensurate enlargement of the ventricular system. Hypodensity throughout the subcortical and periventricular white matter is unchanged, again likely reflecting sequela of chronic white matter microangiopathy There is no mass lesion.  There is no midline shift. Vascular: No hyperdense vessel or unexpected calcification. Skull: Normal. Negative for fracture or focal lesion. Sinuses/Orbits: There is soap bubble appearing fluid in the right sphenoid sinus, unchanged. Bilateral lens implants are in place. The globes and orbits are otherwise unremarkable. Other: None. IMPRESSION: 1. No acute intracranial pathology. 2. Unchanged global parenchymal volume loss and chronic white matter microangiopathy. Electronically Signed   By: Valetta Mole M.D.   On: 07/05/2021 08:28   DG Chest Portable 1 View  Result Date: 07/05/2021 CLINICAL DATA:  Confusion, fall. EXAM: PORTABLE CHEST 1 VIEW COMPARISON:  June 27, 2021.  June 21, 2021. FINDINGS: The heart size and mediastinal contours are within normal limits. No pneumothorax or pleural effusion is noted. Continued presence of cavitary abnormality seen in  both lung apices as noted on prior studies. The visualized skeletal structures are unremarkable. IMPRESSION: Continued presence of cavitary lesions involving both lung apices as described on prior exams. It is uncertain if this represents neoplastic or infectious etiology. Electronically Signed   By: James  Green Jr M.D.   On:  07/05/2021 08:32    Procedures Procedures   Medications Ordered in ED Medications  lactated ringers bolus 1,000 mL (0 mLs Intravenous Stopped 07/05/21 1031)  cefTRIAXone (ROCEPHIN) 1 g in sodium chloride 0.9 % 100 mL IVPB (1 g Intravenous New Bag/Given 07/05/21 1029)    ED Course  I have reviewed the triage vital signs and the nursing notes.  Pertinent labs & imaging results that were available during my care of the patient were reviewed by me and considered in my medical decision making (see chart for details).    MDM Rules/Calculators/A&P                            79-year-old female with a history of diabetes, hypertension, hyperlipidemia, sarcoidosis, colon cancer, cavitary lung lesion for which she just had bronchial biopsy and brushings on October 4, has been seeing Neurology with concern for memory loss with MRI one month ago who presents with concern for ongoing confusion with new fall and hallucinations this AM.  Neurologic exam normal with exception of some portions of exam limited by cognitive dysfunction and does not show any clear signs of weakness, neglect, visual changes or changes in EOM.   Given potential head trauma and mental status changes, CT head ordered and shows no significant abnormalities in comparison to prior. No other signs of traumatic findings on history or exam.  While the hallucinations are new, she is not displaying any focal abnormalities to suggest new CVA and had an MRI for her changing mental status one month ago and do not feel transfer to [facility] for emergent MRI is indicated at this time.   Labs show worsening renal function, not a significant enough change to warrant admission, however requires close outpatient follow up.  Ammonia normal. Hgb at baseline.   Family does report her sensitivity to hydration and that she drank less yesterday AM, and she was given IV fluids for rehydration.  This will also help with AKI.  Urinalysis borderline for  UTI with 11-20WBC, feel it is reasonable to treat given she is unable to reliably say if she has symptoms and family reports improvement with treatment recently. Given IV dose of medication in the ED, rx for keflex.  She remains hemodynamically stable, do not see indication for inpatient admission at this time. Patient discharged in stable condition with understanding of reasons to return.      Final Clinical Impression(s) / ED Diagnoses Final diagnoses:  Urinary tract infection without hematuria, site unspecified  Hallucinations  AKI (acute kidney injury) (HCC)    Rx / DC Orders ED Discharge Orders          Ordered    cephALEXin (KEFLEX) 500 MG capsule  3 times daily        07/05/21 1008             , , MD 07/05/21 2311  

## 2021-07-05 NOTE — ED Notes (Signed)
Patient transported to CT 

## 2021-07-07 LAB — URINE CULTURE: Culture: >=100000 — AB

## 2021-07-08 NOTE — Progress Notes (Signed)
ED Antimicrobial Stewardship Positive Culture Follow Up   Beth [name] is an 80 y.o. female who presented to [facility] on 07/05/2021 with a chief complaint of  Chief Complaint  Patient presents with   Fall   Dehydration    Recent Results (from the past 720 hour(s))  SARS Coronavirus 2 (TAT 6-24 hrs)     Status: None   Collection Time: 06/23/21 12:00 AM  Result Value Ref Range Status   SARS Coronavirus 2 RESULT: NEGATIVE  Corrected    Comment: RESULT: NEGATIVESARS-CoV-2 INTERPRETATION:A NEGATIVE  test result means that SARS-CoV-2 RNA was not present in the specimen above the limit of detection of this test. This does not preclude a possible SARS-CoV-2 infection and should not be used as the  sole basis for patient management decisions. Negative results must be combined with clinical observations, patient history, and epidemiological information. Optimum specimen types and timing for peak viral levels during infections caused by SARS-CoV-2  have not been determined. Collection of multiple specimens or types of specimens may be necessary to detect virus. Improper specimen collection and handling, sequence variability under primers/probes, or organism present below the limit of detection may  lead to false negative results. Positive and negative predictive values of testing are highly dependent on prevalence. False negative test results are more likely when prevalence of disease is high.The expected result is NEGATIVE.Fact S heet for  Healthcare Providers: LocalChronicle.no Sheet for Patients: SalonLookup.es Reference Range - Negative   Fungus Culture With Stain     Status: None (Preliminary result)   Collection Time: 06/27/21  8:31 AM   Specimen: PATH Cytology Navigation; Tissue  Result Value Ref Range Status   Fungus Stain Final report  Final    Comment: (NOTE) Performed At: [facility] [place], [place]  [id number] Rush Farmer MD RK:[id number]    Fungus (Mycology) Culture PENDING  Incomplete   Fungal Source [facility] C  Final    Comment: Performed at [facility], [place] [address] [place], [place] [address]  Aerobic/Anaerobic Culture w Gram Stain (surgical/deep wound)     Status: None   Collection Time: 06/27/21  8:31 AM   Specimen: PATH Cytology Navigation; Tissue  Result Value Ref Range Status   Specimen Description TISSUE  Final   Special Requests RUL NODULE BIOPSY FORCEPS SPEC C  Final   Gram Stain NO WBC SEEN NO ORGANISMS SEEN   Final   Culture   Final    No growth aerobically or anaerobically. Performed at [facility], [place] [address] [place], [place] [address]    Report Status 07/02/2021 FINAL  Final  Acid Fast Smear (AFB)     Status: None   Collection Time: 06/27/21  8:31 AM   Specimen: PATH Cytology Navigation; Tissue  Result Value Ref Range Status   AFB Specimen Processing Concentration  Final   Acid Fast Smear Negative  Final    Comment: (NOTE) Performed At: [facility] [place], [place] [id number] Rush Farmer MD [id number]    Source (AFB) RUL NODULE BIOPSY FORCEPS  Final    Comment: Performed at [facility], [place] [address], [place], [place] [address]  Fungus Culture Result     Status: None   Collection Time: 06/27/21  8:31 AM  Result Value Ref Range Status   Result 1 Comment  Final    Comment: (NOTE) KOH/Calcofluor preparation:  no fungus observed. Performed At: [place],  [place] [id number] Rush Farmer MD [id number]   Fungus Culture With Stain     Status: None (Preliminary result)   Collection Time: 06/27/21  8:42 AM   Specimen: Bronchial Alveolar Lavage; Respiratory  Result Value Ref Range Status   Fungus Stain Final report  Final    Comment: (NOTE) Performed At: [facility] [place], [place] [id number] Rush Farmer MD [id number]     Fungus (Mycology) Culture PENDING  Incomplete   Fungal Source [facility] D  Final    Comment: Performed at [facility], [place] [address] [place], [place] [address]  Culture, BAL-quantitative w Gram Stain     Status: None   Collection Time: 06/27/21  8:42 AM   Specimen: Bronchial Alveolar Lavage; Respiratory  Result Value Ref Range Status   Specimen Description BRONCHIAL ALVEOLAR LAVAGE  Final   Special Requests RUL BAL SPEC D  Final   Gram Stain   Final    FEW WBC PRESENT, PREDOMINANTLY PMN NO ORGANISMS SEEN    Culture   Final    NO GROWTH 2 DAYS Performed at [facility], [address], [place], [place] [address]    Report Status 06/29/2021 FINAL  Final  Acid Fast Smear (AFB)     Status: None   Collection Time: 06/27/21  8:42 AM   Specimen: Bronchial Alveolar Lavage; Respiratory  Result Value Ref Range Status   AFB Specimen Processing Concentration  Final   Acid Fast Smear Negative  Final    Comment: (NOTE) Performed At: [facility] [place], [place] [id number] Rush Farmer MD HU:[id number]    Source (AFB) RUL BAL  Final    Comment: Performed at [facility], [place] [address] [place], [place] [address]  Anaerobic culture w Gram Stain     Status: None   Collection Time: 06/27/21  8:42 AM   Specimen: Bronchoalveolar Lavage  Result Value Ref Range Status   Specimen Description BRONCHIAL ALVEOLAR LAVAGE  Final   Special Requests RUL BAL SPEC D  Final   Gram Stain   Final    FEW WBC PRESENT, PREDOMINANTLY MONONUCLEAR NO ORGANISMS SEEN    Culture   Final    NO ANAEROBES ISOLATED Performed at [facility], [place] [address] [place], [place] [address]    Report Status 07/03/2021 FINAL  Final  Fungus Culture Result     Status: None   Collection Time: 06/27/21  8:42 AM  Result Value Ref Range Status   Result 1 Comment  Final    Comment: (NOTE) KOH/Calcofluor preparation:  no fungus observed. Performed At: [facility] [place], [place] [id number] Rush Farmer MD QP:[id number]   Fungus Culture With Stain     Status: None (Preliminary result)   Collection Time: 06/27/21  8:56 AM   Specimen: PATH Cytology Navigation; Tissue  Result Value Ref Range Status   Fungus Stain Final report  Final    Comment: (NOTE) Performed At: [facility] [place], [place] [id number] Rush Farmer MD [id number]    Fungus (Mycology) Culture PENDING  Incomplete   Fungal Source [facility] G  Final    Comment: Performed at [facility], [place] [address], [place], [place] [address]  Aerobic/Anaerobic Culture w Gram Stain (surgical/deep wound)     Status: None   Collection Time: 06/27/21  8:56 AM   Specimen: PATH Cytology Navigation; Tissue  Result Value Ref Range Status   Specimen  Description TISSUE  Final   Special Requests LUL NODULE BIOPSY FORCEPS SPEC G  Final   Gram Stain NO WBC SEEN NO ORGANISMS SEEN   Final   Culture   Final    No growth aerobically or anaerobically. Performed at [facility], [place] [address] [place], [place] [address]    Report Status 07/02/2021 FINAL  Final  Acid Fast Smear (AFB)     Status: None   Collection Time: 06/27/21  8:56 AM   Specimen: PATH Cytology Navigation; Tissue  Result Value Ref Range Status   AFB Specimen Processing Concentration  Final   Acid Fast Smear Negative  Final    Comment: (NOTE) Performed At: [facility] [place], [place] [id number] Rush Farmer MD [id number]    Source (AFB) LUL NODULE BIOPSY FORECEPS  Final    Comment: Performed at [facility], [place] [address], [place], [place] [address]  Fungus Culture Result     Status: None   Collection Time: 06/27/21  8:56 AM  Result Value Ref Range Status   Result 1 Comment  Final    Comment: (NOTE) KOH/Calcofluor preparation:  no fungus observed. Performed At: [facility] [place], [place] [id number] Rush Farmer MD  [id number]   Fungus Culture With Stain     Status: None (Preliminary result)   Collection Time: 06/27/21  9:00 AM   Specimen: Bronchial Alveolar Lavage; Respiratory  Result Value Ref Range Status   Fungus Stain Final report  Final    Comment: (NOTE) Performed At: [facility] [place], [place] [id number] Rush Farmer MD [id number]    Fungus (Mycology) Culture PENDING  Incomplete   Fungal Source [facility] H  Final    Comment: Performed at [facility], [place] [address], [place], [place] [address]  Culture, BAL-quantitative w Gram Stain     Status: None   Collection Time: 06/27/21  9:00 AM   Specimen: Bronchial Alveolar Lavage; Respiratory  Result Value Ref Range Status   Specimen Description BRONCHIAL ALVEOLAR LAVAGE  Final   Special Requests LUL BAL SPEC H  Final   Gram Stain   Final    RARE WBC PRESENT, PREDOMINANTLY MONONUCLEAR NO ORGANISMS SEEN    Culture   Final    NO GROWTH 2 DAYS Performed at [facility], [address], [place], [place] [address]    Report Status 06/29/2021 FINAL  Final  Acid Fast Smear (AFB)     Status: None   Collection Time: 06/27/21  9:00 AM   Specimen: Bronchial Alveolar Lavage; Respiratory  Result Value Ref Range Status   AFB Specimen Processing Concentration  Final   Acid Fast Smear Negative  Final    Comment: (NOTE) Performed At: [facility] [place], [place] [id number] Rush Farmer MD [id number]    Source (AFB) LUL BAL  Final    Comment: Performed at [facility], [place] [address], [place], [place] [address]  Anaerobic culture w Gram Stain     Status: None   Collection Time: 06/27/21  9:00 AM   Specimen: Bronchoalveolar Lavage  Result Value Ref Range Status   Specimen Description BRONCHIAL ALVEOLAR LAVAGE  Final   Special Requests LUL BAL SPEC H  Final   Gram Stain NO WBC SEEN NO ORGANISMS SEEN   Final   Culture   Final    NO ANAEROBES ISOLATED Performed at [facility], [place]  [address], [place], [place] [address]    Report Status 07/03/2021 FINAL  Final  Fungus Culture Result     Status: None   Collection Time: 06/27/21  9:00 AM  Result Value Ref Range Status   Result 1 Comment  Final    Comment: (NOTE) KOH/Calcofluor preparation:  no fungus observed. Performed At: [facility] [place], [place] [id number] Rush Farmer MD [id number]   Urine Culture     Status: Abnormal   Collection Time: 07/05/21  9:27 AM   Specimen: Urine, Clean Catch  Result Value Ref Range Status   Specimen Description   Final    URINE, CLEAN CATCH Performed at [facility], [address] [place] [address] [place], [place] [address]    Special Requests   Final    NONE Performed at [facility], [address] [place], [place] [address]    Culture >=100,000 COLONIES/mL ENTEROCOCCUS FAECALIS (A)  Final   Report Status 07/07/2021 FINAL  Final   Organism ID, Bacteria ENTEROCOCCUS FAECALIS (A)  Final      Susceptibility   Enterococcus faecalis - MIC*    AMPICILLIN <=2 SENSITIVE Sensitive     NITROFURANTOIN <=16 SENSITIVE Sensitive     VANCOMYCIN 1 SENSITIVE Sensitive     * >=100,000 COLONIES/mL ENTEROCOCCUS FAECALIS    _0  Treated with Keflex, organism resistant to prescribed antimicrobial  New antibiotic prescription: Amoxil  ED Provider: Pati Gallo, PA-C   Bertis Ruddy 07/08/2021, 6:49 PM Clinical Pharmacist Monday - Friday phone -  [phone] Saturday - Sunday phone - [phone]

## 2021-07-09 ENCOUNTER — Telehealth: Payer: Self-pay | Admitting: Emergency Medicine

## 2021-07-09 NOTE — Telephone Encounter (Signed)
Post ED Visit - Positive Culture Follow-up: Successful Patient Follow-Up  Culture assessed and recommendations reviewed by:  []  Elenor Quinones, Pharm.D. []  Heide Guile, Pharm.D., BCPS AQ-ID []  Parks Neptune, Pharm.D., BCPS []  Alycia Rossetti, Pharm.D., BCPS []  [place], Pharm.D., BCPS, AAHIVP []  Legrand Como, Pharm.D., BCPS, AAHIVP []  Salome Arnt, PharmD, BCPS []  Johnnette Gourd, PharmD, BCPS []  Hughes Better, PharmD, BCPS [x]  Bertis Ruddy, PharmD  Positive urine culture  []  Patient discharged without antimicrobial prescription and treatment is now indicated [x]  Organism is resistant to prescribed ED discharge antimicrobial []  Patient with positive blood cultures  Changes discussed with ED provider: Pati Gallo PA New antibiotic prescription Amoxil 500 mg BID for five days Called to [place] [phone]) [phone]  Contacted patient, date 07/09/21, time [place] 07/09/2021, 6:35 PM

## 2021-07-10 DIAGNOSIS — N3000 Acute cystitis without hematuria: Secondary | ICD-10-CM | POA: Diagnosis not present

## 2021-07-10 DIAGNOSIS — R634 Abnormal weight loss: Secondary | ICD-10-CM | POA: Diagnosis not present

## 2021-07-10 DIAGNOSIS — R4189 Other symptoms and signs involving cognitive functions and awareness: Secondary | ICD-10-CM | POA: Diagnosis not present

## 2021-07-10 DIAGNOSIS — R410 Disorientation, unspecified: Secondary | ICD-10-CM | POA: Diagnosis not present

## 2021-07-14 DIAGNOSIS — N390 Urinary tract infection, site not specified: Secondary | ICD-10-CM | POA: Diagnosis not present

## 2021-07-17 ENCOUNTER — Encounter: Payer: Self-pay | Admitting: Pulmonary Disease

## 2021-07-17 ENCOUNTER — Ambulatory Visit (INDEPENDENT_AMBULATORY_CARE_PROVIDER_SITE_OTHER): Payer: Medicare Other | Admitting: Pulmonary Disease

## 2021-07-17 ENCOUNTER — Other Ambulatory Visit: Payer: Self-pay

## 2021-07-17 VITALS — BP 110/72 | HR 101 | Temp 97.9°F | Ht 62.0 in | Wt 148.2 lb

## 2021-07-17 DIAGNOSIS — J984 Other disorders of lung: Secondary | ICD-10-CM | POA: Diagnosis not present

## 2021-07-17 DIAGNOSIS — Z87891 Personal history of nicotine dependence: Secondary | ICD-10-CM

## 2021-07-17 DIAGNOSIS — Z85038 Personal history of other malignant neoplasm of large intestine: Secondary | ICD-10-CM

## 2021-07-17 DIAGNOSIS — R918 Other nonspecific abnormal finding of lung field: Secondary | ICD-10-CM

## 2021-07-17 DIAGNOSIS — Z862 Personal history of diseases of the blood and blood-forming organs and certain disorders involving the immune mechanism: Secondary | ICD-10-CM

## 2021-07-17 DIAGNOSIS — R911 Solitary pulmonary nodule: Secondary | ICD-10-CM

## 2021-07-17 NOTE — Patient Instructions (Addendum)
Thank you for visiting Dr. Valeta Harms at [facility]. Today we recommend the following:  Orders Placed This Encounter  Procedures   CT Chest Wo Contrast   Please schedule CT before appt.   Return in about 3 months (around 10/17/2021) for with Eric Form, NP, or Dr. Valeta Harms.    Please do your part to reduce the spread of COVID-19.

## 2021-07-17 NOTE — Progress Notes (Signed)
Synopsis: Referred in September 2022 for bilateral upper lobe cavitary lesions by [facility]  Subjective:   PATIENT ID: Beth [name] GENDER: female DOB: 1941-07-27, MRN: [id number]  Chief Complaint  Patient presents with   Follow-up    Follow up on double lung biopsy    This is a 80 year old female, past medical history of colon cancer, psoriasis, sarcoidosis that affected the I had adenopathy of the chest in 2002, no prior biopsy, has psoriasis, hypertension hyperlipidemia.  Currently having issues with recurrent UTI.  Family also comments that she had seen some cognitive decline.  Had an event where she passed out went to the emergency department had CT imaging which found incidental upper lobe lesions.  Subsequently had follow-up with pulmonary.  Patient saw Dr. Melvyn Novas.  He ordered a PET scan which revealed to upper lobe hypermetabolic lesions and small adenopathy within the chest.  We are here today to review these images and discuss next steps.  OV 07/17/2021: This is a 80 year old female, here today for follow-up after bronchoscopy.  Tissue biopsies were negative for malignancy there was atypical cells noted on one slide however favored to be reactive bronchial epithelial cells.  All cultures at this point are negative acute.  Fungal cultures and AFB are still going to be held for several weeks.  We will let them know if anything comes out of that.  So far doing well from a respiratory standpoint with no complaints today.   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 ; reports hasnt had any affliction in years     Wears glasses    Wears partial dentures    top and bottom partials     Family History  Problem Relation Age of Onset   Cancer Mother        colon    Hypertension Father    CVA Father    Obesity Brother      Past Surgical History:  Procedure Laterality Date   ABDOMINAL HYSTERECTOMY     APPENDECTOMY     BRONCHIAL BIOPSY  06/27/2021   Procedure: BRONCHIAL BIOPSIES;  Surgeon: Garner Nash, DO;  Location: [facility];  Service: Pulmonary;;   BRONCHIAL BRUSHINGS  06/27/2021   Procedure: BRONCHIAL BRUSHINGS;  Surgeon: Garner Nash, DO;  Location: [facility];  Service: Pulmonary;;   BRONCHIAL WASHINGS  06/27/2021   Procedure: BRONCHIAL WASHINGS;  Surgeon: Garner Nash, DO;  Location: [facility];  Service: Pulmonary;;   CATARACT EXTRACTION, BILATERAL     COLONOSCOPY     many   COMBINED MEDIASTINOSCOPY AND BRONCHOSCOPY  2002   biopsy   DG ARTHRO THUMB*R*  2011   ablasion rt thumb nailbed   FIDUCIAL MARKER PLACEMENT  06/27/2021   Procedure: FIDUCIAL MARKER PLACEMENT;  Surgeon: Garner Nash, DO;  Location: [facility];  Service: Pulmonary;;   FORAMINOTOMY 1 LEVEL Bilateral 05/09/2018   Procedure: Foraminotomy L4-L5 Root;  Surgeon: [name], MD;  Location: [facility];  Service: Orthopedics;  Laterality: Bilateral;  I & D EXTREMITY Right 07/23/2013   Procedure: IRRIGATION AND DEBRIDEMENT DIP JOINT RIGHT INDEX FINGER, EXCISE MUCOID CYST RIGHT INDEX FINGER  ;  Surgeon: Cammie Sickle., MD;  Location: [facility];  Service: Orthopedics;  Laterality: Right;   LIPOMA EXCISION     LUMBAR LAMINECTOMY/DECOMPRESSION MICRODISCECTOMY N/A 05/09/2018   Procedure: Complete decompressive lumbar laminectomy L4-L5;  Surgeon: [name], MD;  Location: [facility];  Service: Orthopedics;  Laterality: N/A;  167mn   MASS EXCISION Right 09/28/2014   Procedure: EXCISION MASS RIGHT MIDDLE FINGER;  Surgeon: GDaryll Brod MD;  [place] [facility]  Service:  Orthopedics;  Laterality: Right;  ANESTHESIA:  GENERAL, IV REGIONAL FAB   MASS EXCISION Bilateral 12/24/2017   Procedure: EXCISION MASS RIGHT SMALL, LEFT INDEX, excision left nail horn middle finger;  Surgeon: KDaryll Brod MD;  [facility]  Service: Orthopedics;  Laterality: Bilateral;  FAB   NAILBED REPAIR Left 08/28/2016   Procedure: Ablation nail matrix left index finger andl left middle finger;  Surgeon: GDaryll Brod MD;  [facility]  Service: Orthopedics;  Laterality: Left;   PARTIAL COLECTOMY  1998   cancer-rad/chemo   SKIN FULL THICKNESS GRAFT Right 07/23/2013   Procedure: NAIL PLATE ABLATION WITH FULL THICKNESS SKIN GRAFT FROM RIGHT   MEDIAL ELBOW TO RIGHT LONG FINGER NAIL BED;  Surgeon: RCammie Sickle, MD;  [place] [facility]  Service: Orthopedics;  Laterality: Right;   SKIN FULL THICKNESS GRAFT Left 08/28/2016   Procedure: SKIN GRAFT FULL THICKNESS upper left arm;  Surgeon: GDaryll Brod MD;  [facility] [facility]  Service: Orthopedics;  Laterality: Left;   VIDEO BRONCHOSCOPY WITH ENDOBRONCHIAL NAVIGATION Bilateral 06/27/2021   Procedure: VIDEO BRONCHOSCOPY WITH ENDOBRONCHIAL NAVIGATION;  Surgeon: IGarner Nash DO;  [place] [facility]  Service: Pulmonary;  Laterality: Bilateral;  ION, possible fiducial placement   VIDEO BRONCHOSCOPY WITH RADIAL ENDOBRONCHIAL ULTRASOUND  06/27/2021   Procedure: RADIAL ENDOBRONCHIAL ULTRASOUND;  Surgeon: IGarner Nash DO;  Location: [facility];  Service: Pulmonary;;    Social History   Socioeconomic History   Marital status: Married    Spouse name: Not on file   Number of children: Not on file   Years of education: Not on file   Highest education level: Not on file  Occupational History   Not on file  Tobacco Use   Smoking status: Former    Packs/day: 0.50    Types: Cigarettes    Quit date: 07/22/1971    Years since quitting: 50.0   Smokeless  tobacco: Never  Vaping Use   Vaping Use: Never used  Substance and Sexual Activity   Alcohol use: No    Alcohol/week: 0.0 standard drinks   Drug use: No   Sexual activity: Yes    Birth control/protection: Surgical    Comment: Hysterectomy  Other Topics Concern   Not on file  Social History Narrative   Married   3 children   Social Determinants of Health   Financial Resource Strain: Not on file  Food Insecurity: Not on file  Transportation Needs: Not on file  Physical Activity: Not on file  Stress: Not on file  Social Connections: Not on file  Intimate Partner Violence: Not on file     No Known Allergies   Outpatient Medications Prior to Visit  Medication Sig Dispense Refill   acetaminophen (TYLENOL) 325 MG tablet Take 325-650 mg by mouth 2 (two) times  daily as needed for moderate pain.      Blood Glucose Monitoring Suppl (ONETOUCH VERIO) w/Device KIT Use as instructed to check blood sugar 4 times daily 1 kit 0   DROPLET PEN NEEDLES 32G X 5 MM MISC USE TO INJECT INSULIN ONE TIME DAILY AS DIRECTED (NEED APPOINTMENT FOR FURTHER REFILLS) 100 each 2   glipiZIDE (GLUCOTROL XL) 10 MG 24 hr tablet TAKE 2 TABLETs in am and 1 tablet before dinner (Patient taking differently: Take by mouth daily with breakfast. 1 in am) 270 tablet 3   glucose blood (ONETOUCH VERIO) test strip Use as instructed to check blood sugar 4 times daily 400 each 3   insulin degludec (TRESIBA FLEXTOUCH) 100 UNIT/ML FlexTouch Pen INJECT 15 UNITS SUBCUTANEOUSLY DAILY (Patient taking differently: at bedtime. INJECT 15 UNITS SUBCUTANEOUSLY DAILY) 15 mL 3   Lancets (ONETOUCH ULTRASOFT) lancets Use as instructed to check blood sugar 4 times daily 400 each 3   lisinopril (ZESTRIL) 10 MG tablet Take 10 mg by mouth daily.     loratadine (CLARITIN) 10 MG tablet Take 10 mg by mouth daily as needed for allergies.     lovastatin (MEVACOR) 10 MG tablet Take 10 mg by mouth every evening.      metFORMIN (GLUCOPHAGE) 1000 MG  tablet TAKE 1 TABLET TWICE DAILY WITH MEALS 180 tablet 3   Multiple Vitamin (MULTIVITAMIN) tablet Take 1 tablet by mouth daily.     ramipril (ALTACE) 10 MG capsule Take 10 mg by mouth daily.     No facility-administered medications prior to visit.    Review of Systems  Constitutional:  Negative for chills, fever, malaise/fatigue and weight loss.  HENT:  Negative for hearing loss, sore throat and tinnitus.   Eyes:  Negative for blurred vision and double vision.  Respiratory:  Negative for cough, hemoptysis, sputum production, shortness of breath, wheezing and stridor.   Cardiovascular:  Negative for chest pain, palpitations, orthopnea, leg swelling and PND.  Gastrointestinal:  Negative for abdominal pain, constipation, diarrhea, heartburn, nausea and vomiting.  Genitourinary:  Negative for dysuria, hematuria and urgency.  Musculoskeletal:  Negative for joint pain and myalgias.  Skin:  Negative for itching and rash.  Neurological:  Negative for dizziness, tingling, weakness and headaches.  Endo/Heme/Allergies:  Negative for environmental allergies. Does not bruise/bleed easily.  Psychiatric/Behavioral:  Negative for depression. The patient is not nervous/anxious and does not have insomnia.   All other systems reviewed and are negative.   Objective:  Physical Exam Vitals reviewed.  Constitutional:      General: She is not in acute distress.    Appearance: She is well-developed.  HENT:     Head: Normocephalic and atraumatic.  Eyes:     General: No scleral icterus.    Conjunctiva/sclera: Conjunctivae normal.     Pupils: Pupils are equal, round, and reactive to light.  Neck:     Vascular: No JVD.     Trachea: No tracheal deviation.  Cardiovascular:     Rate and Rhythm: Normal rate and regular rhythm.     Heart sounds: Normal heart sounds. No murmur heard. Pulmonary:     Effort: Pulmonary effort is normal. No tachypnea, accessory muscle usage or respiratory distress.     Breath  sounds: No stridor. No wheezing, rhonchi or rales.  Abdominal:     General: Bowel sounds are normal. There is no distension.     Palpations: Abdomen is soft.     Tenderness: There is no abdominal tenderness.  Musculoskeletal:  General: No tenderness.     Cervical back: Neck supple.  Lymphadenopathy:     Cervical: No cervical adenopathy.  Skin:    General: Skin is warm and dry.     Capillary Refill: Capillary refill takes less than 2 seconds.     Findings: No rash.  Neurological:     Mental Status: She is alert and oriented to person, place, and time.  Psychiatric:        Behavior: Behavior normal.     Vitals:   07/17/21 0907  BP: 110/72  Pulse: (!) 101  Temp: 97.9 F (36.6 C)  TempSrc: Oral  SpO2: 100%  Weight: 148 lb 3.2 oz (67.2 kg)  Height: _0  (1.575 m)   100% on RA BMI Readings from Last 3 Encounters:  07/17/21 27.11 kg/m  07/05/21 29.29 kg/m  06/27/21 28.35 kg/m   Wt Readings from Last 3 Encounters:  07/17/21 148 lb 3.2 oz (67.2 kg)  07/05/21 155 lb (70.3 kg)  06/27/21 155 lb (70.3 kg)     CBC    Component Value Date/Time   WBC 11.4 (H) 07/05/2021 0645   RBC 3.80 (L) 07/05/2021 0645   HGB 11.5 (L) 07/05/2021 0645   HCT 34.9 (L) 07/05/2021 0645   PLT 281 07/05/2021 0645   MCV 91.8 07/05/2021 0645   MCH 30.3 07/05/2021 0645   MCHC 33.0 07/05/2021 0645   RDW 13.2 07/05/2021 0645   LYMPHSABS 1.8 07/05/2021 0645   MONOABS 0.4 07/05/2021 0645   EOSABS 0.1 07/05/2021 0645   BASOSABS 0.1 07/05/2021 0645     Chest Imaging: 05/31/2021: Symmetric upper lobe thick-walled hypermetabolic cavitary lesions possibly infectious versus synchronous primary. Small adenopathy within the mediastinum. The patient's images have been independently reviewed by me.    Pulmonary Functions Testing Results: No flowsheet data found.  FeNO: no  Pathology:   06/27/2021 bronchoscopy: Atypical cells, favor reactive bronchial epithelial cells, no  malignancy.  Echocardiogram: no  Heart Catheterization: no    Assessment & Plan:     ICD-10-CM   1. Lung nodule  R91.1 CT Chest Wo Contrast    2. Cavitary lesion of lung  J98.4     3. Former smoker  Z87.891     4. History of colon cancer  Z85.038     5. History of sarcoidosis  Z86.2    - Seen on CT imaging, no previous biopsies    6. Multiple lung nodules on CT  R91.8        Discussion:  This is a 80 year old female, former smoker, history of colon cancer, history of sarcoidosis, no biopsies had questionable eye involvement and adenopathy on CT imaging in 2002.  I had looked at the imaging reports from 2002 which documented pretracheal and subcarinal adenopathy but there was no documentation of upper lobe involvement of the lungs.  She was incidentally found to have bilateral upper lobe cavitary lesions on the repeat CT imaging that was of recent.  That showed low-level hypermetabolic uptake on pet imaging decision was made for bronchoscopy and tissue sampling.  So far the biopsy results have been relatively unrevealing, no evidence of malignancy and cultures with no growth to date  Plan: Today we discussed all of the various options. I suspect we are dealing with inflammatory and old cavities within the upper lobes. I discussed this with patient's primary pulmonologist Dr. Melvyn Novas this past week. We agreed that ongoing his CT image surveillance would be most prudent. We will plan for repeat in January  2023. Patient can follow-up with [place] after this. Will let them know if there is any changes in her culture results over the next couple of weeks.  They usually hold these for approximately 6 weeks before discard.   Current Outpatient Medications:    acetaminophen (TYLENOL) 325 MG tablet, Take 325-650 mg by mouth 2 (two) times daily as needed for moderate pain. , Disp: , Rfl:    Blood Glucose Monitoring Suppl (ONETOUCH VERIO) w/Device KIT, Use as instructed to check blood sugar 4  times daily, Disp: 1 kit, Rfl: 0   DROPLET PEN NEEDLES 32G X 5 MM MISC, USE TO INJECT INSULIN ONE TIME DAILY AS DIRECTED (NEED APPOINTMENT FOR FURTHER REFILLS), Disp: 100 each, Rfl: 2   glipiZIDE (GLUCOTROL XL) 10 MG 24 hr tablet, TAKE 2 TABLETs in am and 1 tablet before dinner (Patient taking differently: Take by mouth daily with breakfast. 1 in am), Disp: 270 tablet, Rfl: 3   glucose blood (ONETOUCH VERIO) test strip, Use as instructed to check blood sugar 4 times daily, Disp: 400 each, Rfl: 3   insulin degludec (TRESIBA FLEXTOUCH) 100 UNIT/ML FlexTouch Pen, INJECT 15 UNITS SUBCUTANEOUSLY DAILY (Patient taking differently: at bedtime. INJECT 15 UNITS SUBCUTANEOUSLY DAILY), Disp: 15 mL, Rfl: 3   Lancets (ONETOUCH ULTRASOFT) lancets, Use as instructed to check blood sugar 4 times daily, Disp: 400 each, Rfl: 3   lisinopril (ZESTRIL) 10 MG tablet, Take 10 mg by mouth daily., Disp: , Rfl:    loratadine (CLARITIN) 10 MG tablet, Take 10 mg by mouth daily as needed for allergies., Disp: , Rfl:    lovastatin (MEVACOR) 10 MG tablet, Take 10 mg by mouth every evening. , Disp: , Rfl:    metFORMIN (GLUCOPHAGE) 1000 MG tablet, TAKE 1 TABLET TWICE DAILY WITH MEALS, Disp: 180 tablet, Rfl: 3   Multiple Vitamin (MULTIVITAMIN) tablet, Take 1 tablet by mouth daily., Disp: , Rfl:    ramipril (ALTACE) 10 MG capsule, Take 10 mg by mouth daily., Disp: , Rfl:    Garner Nash, DO [facility] 07/17/2021 9:22 AM

## 2021-07-21 ENCOUNTER — Emergency Department (HOSPITAL_COMMUNITY): Payer: Medicare Other

## 2021-07-21 ENCOUNTER — Encounter (HOSPITAL_COMMUNITY): Payer: Self-pay

## 2021-07-21 ENCOUNTER — Inpatient Hospital Stay (HOSPITAL_COMMUNITY)
Admission: EM | Admit: 2021-07-21 | Discharge: 2021-07-26 | DRG: 682 | Disposition: A | Discharge status: Hospice | Payer: Medicare Other | Attending: Pulmonary Disease | Admitting: Pulmonary Disease

## 2021-07-21 ENCOUNTER — Other Ambulatory Visit: Payer: Self-pay

## 2021-07-21 ENCOUNTER — Inpatient Hospital Stay (HOSPITAL_COMMUNITY): Payer: Medicare Other

## 2021-07-21 DIAGNOSIS — W19XXXA Unspecified fall, initial encounter: Secondary | ICD-10-CM | POA: Diagnosis present

## 2021-07-21 DIAGNOSIS — Z66 Do not resuscitate: Secondary | ICD-10-CM | POA: Diagnosis not present

## 2021-07-21 DIAGNOSIS — I959 Hypotension, unspecified: Secondary | ICD-10-CM | POA: Diagnosis not present

## 2021-07-21 DIAGNOSIS — R531 Weakness: Secondary | ICD-10-CM | POA: Diagnosis not present

## 2021-07-21 DIAGNOSIS — D860 Sarcoidosis of lung: Secondary | ICD-10-CM | POA: Diagnosis present

## 2021-07-21 DIAGNOSIS — F02B0 Dementia in other diseases classified elsewhere, moderate, without behavioral disturbance, psychotic disturbance, mood disturbance, and anxiety: Secondary | ICD-10-CM | POA: Diagnosis not present

## 2021-07-21 DIAGNOSIS — E872 Acidosis, unspecified: Secondary | ICD-10-CM | POA: Diagnosis present

## 2021-07-21 DIAGNOSIS — G9341 Metabolic encephalopathy: Secondary | ICD-10-CM | POA: Diagnosis present

## 2021-07-21 DIAGNOSIS — E46 Unspecified protein-calorie malnutrition: Secondary | ICD-10-CM | POA: Diagnosis present

## 2021-07-21 DIAGNOSIS — E875 Hyperkalemia: Secondary | ICD-10-CM | POA: Diagnosis present

## 2021-07-21 DIAGNOSIS — R0902 Hypoxemia: Secondary | ICD-10-CM | POA: Diagnosis not present

## 2021-07-21 DIAGNOSIS — E162 Hypoglycemia, unspecified: Secondary | ICD-10-CM | POA: Diagnosis not present

## 2021-07-21 DIAGNOSIS — Z9049 Acquired absence of other specified parts of digestive tract: Secondary | ICD-10-CM

## 2021-07-21 DIAGNOSIS — N1831 Chronic kidney disease, stage 3a: Secondary | ICD-10-CM | POA: Diagnosis not present

## 2021-07-21 DIAGNOSIS — Z781 Physical restraint status: Secondary | ICD-10-CM

## 2021-07-21 DIAGNOSIS — Z515 Encounter for palliative care: Secondary | ICD-10-CM

## 2021-07-21 DIAGNOSIS — N170 Acute kidney failure with tubular necrosis: Principal | ICD-10-CM | POA: Diagnosis present

## 2021-07-21 DIAGNOSIS — Z794 Long term (current) use of insulin: Secondary | ICD-10-CM

## 2021-07-21 DIAGNOSIS — G8929 Other chronic pain: Secondary | ICD-10-CM | POA: Diagnosis present

## 2021-07-21 DIAGNOSIS — Z87440 Personal history of urinary (tract) infections: Secondary | ICD-10-CM | POA: Diagnosis present

## 2021-07-21 DIAGNOSIS — Z7984 Long term (current) use of oral hypoglycemic drugs: Secondary | ICD-10-CM

## 2021-07-21 DIAGNOSIS — M549 Dorsalgia, unspecified: Secondary | ICD-10-CM | POA: Diagnosis present

## 2021-07-21 DIAGNOSIS — B370 Candidal stomatitis: Secondary | ICD-10-CM | POA: Diagnosis present

## 2021-07-21 DIAGNOSIS — E785 Hyperlipidemia, unspecified: Secondary | ICD-10-CM | POA: Diagnosis present

## 2021-07-21 DIAGNOSIS — E161 Other hypoglycemia: Secondary | ICD-10-CM | POA: Diagnosis not present

## 2021-07-21 DIAGNOSIS — D631 Anemia in chronic kidney disease: Secondary | ICD-10-CM | POA: Diagnosis present

## 2021-07-21 DIAGNOSIS — I129 Hypertensive chronic kidney disease with stage 1 through stage 4 chronic kidney disease, or unspecified chronic kidney disease: Secondary | ICD-10-CM | POA: Diagnosis present

## 2021-07-21 DIAGNOSIS — F0284 Dementia in other diseases classified elsewhere, unspecified severity, with anxiety: Secondary | ICD-10-CM | POA: Diagnosis present

## 2021-07-21 DIAGNOSIS — Z87891 Personal history of nicotine dependence: Secondary | ICD-10-CM

## 2021-07-21 DIAGNOSIS — R4182 Altered mental status, unspecified: Secondary | ICD-10-CM | POA: Diagnosis not present

## 2021-07-21 DIAGNOSIS — Z85038 Personal history of other malignant neoplasm of large intestine: Secondary | ICD-10-CM

## 2021-07-21 DIAGNOSIS — R6510 Systemic inflammatory response syndrome (SIRS) of non-infectious origin without acute organ dysfunction: Secondary | ICD-10-CM | POA: Diagnosis present

## 2021-07-21 DIAGNOSIS — M2578 Osteophyte, vertebrae: Secondary | ICD-10-CM | POA: Diagnosis not present

## 2021-07-21 DIAGNOSIS — Z6827 Body mass index (BMI) 27.0-27.9, adult: Secondary | ICD-10-CM

## 2021-07-21 DIAGNOSIS — R34 Anuria and oliguria: Secondary | ICD-10-CM | POA: Diagnosis not present

## 2021-07-21 DIAGNOSIS — Z452 Encounter for adjustment and management of vascular access device: Secondary | ICD-10-CM | POA: Diagnosis not present

## 2021-07-21 DIAGNOSIS — Z90710 Acquired absence of both cervix and uterus: Secondary | ICD-10-CM

## 2021-07-21 DIAGNOSIS — E871 Hypo-osmolality and hyponatremia: Secondary | ICD-10-CM | POA: Diagnosis present

## 2021-07-21 DIAGNOSIS — N2889 Other specified disorders of kidney and ureter: Secondary | ICD-10-CM | POA: Diagnosis not present

## 2021-07-21 DIAGNOSIS — F0280 Dementia in other diseases classified elsewhere without behavioral disturbance: Secondary | ICD-10-CM | POA: Diagnosis not present

## 2021-07-21 DIAGNOSIS — E11649 Type 2 diabetes mellitus with hypoglycemia without coma: Secondary | ICD-10-CM | POA: Diagnosis present

## 2021-07-21 DIAGNOSIS — N183 Chronic kidney disease, stage 3 unspecified: Secondary | ICD-10-CM | POA: Diagnosis not present

## 2021-07-21 DIAGNOSIS — Z79899 Other long term (current) drug therapy: Secondary | ICD-10-CM

## 2021-07-21 DIAGNOSIS — N1832 Chronic kidney disease, stage 3b: Secondary | ICD-10-CM | POA: Diagnosis present

## 2021-07-21 DIAGNOSIS — E43 Unspecified severe protein-calorie malnutrition: Secondary | ICD-10-CM | POA: Diagnosis not present

## 2021-07-21 DIAGNOSIS — R404 Transient alteration of awareness: Secondary | ICD-10-CM | POA: Diagnosis not present

## 2021-07-21 DIAGNOSIS — Z20822 Contact with and (suspected) exposure to covid-19: Secondary | ICD-10-CM | POA: Diagnosis present

## 2021-07-21 DIAGNOSIS — Z800 Family history of malignant neoplasm of digestive organs: Secondary | ICD-10-CM

## 2021-07-21 DIAGNOSIS — Z8249 Family history of ischemic heart disease and other diseases of the circulatory system: Secondary | ICD-10-CM

## 2021-07-21 DIAGNOSIS — R296 Repeated falls: Secondary | ICD-10-CM | POA: Diagnosis present

## 2021-07-21 DIAGNOSIS — D696 Thrombocytopenia, unspecified: Secondary | ICD-10-CM | POA: Diagnosis present

## 2021-07-21 DIAGNOSIS — G309 Alzheimer's disease, unspecified: Secondary | ICD-10-CM | POA: Diagnosis present

## 2021-07-21 DIAGNOSIS — F419 Anxiety disorder, unspecified: Secondary | ICD-10-CM | POA: Diagnosis present

## 2021-07-21 DIAGNOSIS — E1122 Type 2 diabetes mellitus with diabetic chronic kidney disease: Secondary | ICD-10-CM | POA: Diagnosis present

## 2021-07-21 DIAGNOSIS — G301 Alzheimer's disease with late onset: Secondary | ICD-10-CM | POA: Diagnosis not present

## 2021-07-21 DIAGNOSIS — I10 Essential (primary) hypertension: Secondary | ICD-10-CM | POA: Diagnosis not present

## 2021-07-21 DIAGNOSIS — J690 Pneumonitis due to inhalation of food and vomit: Secondary | ICD-10-CM | POA: Diagnosis present

## 2021-07-21 DIAGNOSIS — K828 Other specified diseases of gallbladder: Secondary | ICD-10-CM | POA: Diagnosis not present

## 2021-07-21 DIAGNOSIS — N179 Acute kidney failure, unspecified: Secondary | ICD-10-CM | POA: Diagnosis present

## 2021-07-21 DIAGNOSIS — N281 Cyst of kidney, acquired: Secondary | ICD-10-CM | POA: Diagnosis not present

## 2021-07-21 DIAGNOSIS — R0603 Acute respiratory distress: Secondary | ICD-10-CM | POA: Diagnosis not present

## 2021-07-21 DIAGNOSIS — Z823 Family history of stroke: Secondary | ICD-10-CM

## 2021-07-21 DIAGNOSIS — G319 Degenerative disease of nervous system, unspecified: Secondary | ICD-10-CM | POA: Diagnosis present

## 2021-07-21 DIAGNOSIS — R918 Other nonspecific abnormal finding of lung field: Secondary | ICD-10-CM | POA: Diagnosis not present

## 2021-07-21 DIAGNOSIS — Z7189 Other specified counseling: Secondary | ICD-10-CM | POA: Diagnosis not present

## 2021-07-21 DIAGNOSIS — I700 Atherosclerosis of aorta: Secondary | ICD-10-CM | POA: Diagnosis not present

## 2021-07-21 DIAGNOSIS — K219 Gastro-esophageal reflux disease without esophagitis: Secondary | ICD-10-CM | POA: Diagnosis present

## 2021-07-21 DIAGNOSIS — N189 Chronic kidney disease, unspecified: Secondary | ICD-10-CM | POA: Diagnosis present

## 2021-07-21 DIAGNOSIS — N19 Unspecified kidney failure: Secondary | ICD-10-CM | POA: Diagnosis not present

## 2021-07-21 DIAGNOSIS — Z923 Personal history of irradiation: Secondary | ICD-10-CM

## 2021-07-21 DIAGNOSIS — D649 Anemia, unspecified: Secondary | ICD-10-CM | POA: Diagnosis present

## 2021-07-21 DIAGNOSIS — R627 Adult failure to thrive: Secondary | ICD-10-CM | POA: Diagnosis not present

## 2021-07-21 DIAGNOSIS — N390 Urinary tract infection, site not specified: Secondary | ICD-10-CM | POA: Diagnosis not present

## 2021-07-21 DIAGNOSIS — Z743 Need for continuous supervision: Secondary | ICD-10-CM | POA: Diagnosis not present

## 2021-07-21 DIAGNOSIS — Z9221 Personal history of antineoplastic chemotherapy: Secondary | ICD-10-CM

## 2021-07-21 DIAGNOSIS — R1310 Dysphagia, unspecified: Secondary | ICD-10-CM | POA: Diagnosis not present

## 2021-07-21 DIAGNOSIS — M159 Polyosteoarthritis, unspecified: Secondary | ICD-10-CM | POA: Diagnosis present

## 2021-07-21 DIAGNOSIS — E1110 Type 2 diabetes mellitus with ketoacidosis without coma: Secondary | ICD-10-CM | POA: Diagnosis not present

## 2021-07-21 LAB — COMPREHENSIVE METABOLIC PANEL
ALT: 19 U/L (ref 0–44)
AST: 27 U/L (ref 15–41)
Albumin: 3.3 g/dL — ABNORMAL LOW (ref 3.5–5.0)
Alkaline Phosphatase: 88 U/L (ref 38–126)
Anion gap: 18 — ABNORMAL HIGH (ref 5–15)
BUN: 54 mg/dL — ABNORMAL HIGH (ref 8–23)
CO2: 12 mmol/L — ABNORMAL LOW (ref 22–32)
Calcium: 8.8 mg/dL — ABNORMAL LOW (ref 8.9–10.3)
Chloride: 94 mmol/L — ABNORMAL LOW (ref 98–111)
Creatinine, Ser: 5.5 mg/dL — ABNORMAL HIGH (ref 0.44–1.00)
GFR, Estimated: 7 mL/min — ABNORMAL LOW (ref >60)
Glucose, Bld: 156 mg/dL — ABNORMAL HIGH (ref 70–99)
Potassium: 5.8 mmol/L — ABNORMAL HIGH (ref 3.5–5.1)
Sodium: 124 mmol/L — ABNORMAL LOW (ref 135–145)
Total Bilirubin: 0.8 mg/dL (ref 0.3–1.2)
Total Protein: 6.5 g/dL (ref 6.5–8.1)

## 2021-07-21 LAB — CBC WITH DIFFERENTIAL/PLATELET
Abs Immature Granulocytes: 0.07 10*3/uL (ref 0.00–0.07)
Basophils Absolute: 0.1 10*3/uL (ref 0.0–0.1)
Basophils Relative: 1 %
Eosinophils Absolute: 0.1 10*3/uL (ref 0.0–0.5)
Eosinophils Relative: 1 %
HCT: 32 % — ABNORMAL LOW (ref 36.0–46.0)
Hemoglobin: 10.6 g/dL — ABNORMAL LOW (ref 12.0–15.0)
Immature Granulocytes: 1 %
Lymphocytes Relative: 17 %
Lymphs Abs: 2.2 10*3/uL (ref 0.7–4.0)
MCH: 30.5 pg (ref 26.0–34.0)
MCHC: 33.1 g/dL (ref 30.0–36.0)
MCV: 92.2 fL (ref 80.0–100.0)
Monocytes Absolute: 0.5 10*3/uL (ref 0.1–1.0)
Monocytes Relative: 4 %
Neutro Abs: 10.1 10*3/uL — ABNORMAL HIGH (ref 1.7–7.7)
Neutrophils Relative %: 76 %
Platelets: 287 10*3/uL (ref 150–400)
RBC: 3.47 MIL/uL — ABNORMAL LOW (ref 3.87–5.11)
RDW: 13.2 % (ref 11.5–15.5)
WBC: 13.1 10*3/uL — ABNORMAL HIGH (ref 4.0–10.5)
nRBC: 0 % (ref 0.0–0.2)

## 2021-07-21 LAB — LACTIC ACID, PLASMA
Lactic Acid, Venous: 5.7 mmol/L (ref 0.5–1.9)
Lactic Acid, Venous: 5.8 mmol/L (ref 0.5–1.9)

## 2021-07-21 LAB — PROTIME-INR
INR: 1 (ref 0.8–1.2)
Prothrombin Time: 13.1 seconds (ref 11.4–15.2)

## 2021-07-21 LAB — TSH: TSH: 2.983 u[IU]/mL (ref 0.350–4.500)

## 2021-07-21 LAB — CBG MONITORING, ED: Glucose-Capillary: 136 mg/dL — ABNORMAL HIGH (ref 70–99)

## 2021-07-21 LAB — RESP PANEL BY RT-PCR (FLU A&B, COVID) ARPGX2
Influenza A by PCR: NEGATIVE
Influenza B by PCR: NEGATIVE
SARS Coronavirus 2 by RT PCR: NEGATIVE

## 2021-07-21 LAB — HEMOGLOBIN A1C
Hgb A1c MFr Bld: 6.1 % — ABNORMAL HIGH (ref 4.8–5.6)
Mean Plasma Glucose: 128.37 mg/dL

## 2021-07-21 LAB — LIPASE, BLOOD: Lipase: 25 U/L (ref 11–51)

## 2021-07-21 LAB — CK: Total CK: 82 U/L (ref 38–234)

## 2021-07-21 LAB — AMMONIA: Ammonia: 12 umol/L (ref 9–35)

## 2021-07-21 MED ORDER — SODIUM CHLORIDE 0.9 % IV SOLN: INTRAVENOUS | Status: DC

## 2021-07-21 MED ORDER — FAMOTIDINE 20 MG PO TABS
20.0000 mg | ORAL_TABLET | Freq: Every day | ORAL | Status: DC
  Administered 2021-07-24: 20 mg via ORAL
  Filled 2021-07-21: qty 1

## 2021-07-21 MED ORDER — LORAZEPAM 2 MG/ML IJ SOLN
0.5000 mg | Freq: Four times a day (QID) | INTRAMUSCULAR | Status: DC | PRN
  Administered 2021-07-21 – 2021-07-22 (×2): 0.5 mg via INTRAVENOUS
  Filled 2021-07-21 (×2): qty 1

## 2021-07-21 MED ORDER — SODIUM CHLORIDE 0.9 % IV BOLUS
1000.0000 mL | Freq: Once | INTRAVENOUS | Status: AC
  Administered 2021-07-21: 1000 mL via INTRAVENOUS

## 2021-07-21 MED ORDER — SODIUM CHLORIDE 0.9% FLUSH
3.0000 mL | Freq: Two times a day (BID) | INTRAVENOUS | Status: DC
  Administered 2021-07-21 – 2021-07-25 (×5): 3 mL via INTRAVENOUS

## 2021-07-21 MED ORDER — FAMOTIDINE IN NACL 20-0.9 MG/50ML-% IV SOLN: 20.0000 mg | Freq: Two times a day (BID) | INTRAVENOUS | Status: DC

## 2021-07-21 MED ORDER — ONDANSETRON HCL 4 MG PO TABS: 4.0000 mg | ORAL_TABLET | Freq: Four times a day (QID) | ORAL | Status: DC | PRN

## 2021-07-21 MED ORDER — AMLODIPINE BESYLATE 5 MG PO TABS
5.0000 mg | ORAL_TABLET | Freq: Every day | ORAL | Status: DC
  Administered 2021-07-25 – 2021-07-26 (×2): 5 mg via ORAL
  Filled 2021-07-21 (×3): qty 1

## 2021-07-21 MED ORDER — SODIUM CHLORIDE 0.9 % IV BOLUS: 1000.0000 mL | Freq: Once | INTRAVENOUS | Status: DC

## 2021-07-21 MED ORDER — ONDANSETRON HCL 4 MG/2ML IJ SOLN
4.0000 mg | Freq: Four times a day (QID) | INTRAMUSCULAR | Status: DC | PRN
  Administered 2021-07-23: 4 mg via INTRAVENOUS
  Filled 2021-07-21: qty 2

## 2021-07-21 MED ORDER — ALBUTEROL SULFATE (2.5 MG/3ML) 0.083% IN NEBU: 2.5000 mg | INHALATION_SOLUTION | Freq: Four times a day (QID) | RESPIRATORY_TRACT | Status: DC | PRN

## 2021-07-21 MED ORDER — SODIUM CHLORIDE 0.9 % IV SOLN
1.5000 g | INTRAVENOUS | Status: DC
  Administered 2021-07-21 – 2021-07-22 (×2): 1.5 g via INTRAVENOUS
  Filled 2021-07-21 (×5): qty 4

## 2021-07-21 MED ORDER — SODIUM CHLORIDE 0.9 % IV SOLN
1.0000 g | Freq: Once | INTRAVENOUS | Status: DC
  Filled 2021-07-21: qty 10

## 2021-07-21 MED ORDER — STERILE WATER FOR INJECTION IV SOLN
INTRAVENOUS | Status: DC
  Filled 2021-07-21: qty 1000

## 2021-07-21 MED ORDER — FAMOTIDINE IN NACL 20-0.9 MG/50ML-% IV SOLN: 20.0000 mg | Freq: Once | INTRAVENOUS | Status: DC

## 2021-07-21 MED ORDER — NYSTATIN 100000 UNIT/ML MT SUSP
5.0000 mL | Freq: Four times a day (QID) | OROMUCOSAL | Status: DC
  Administered 2021-07-23 – 2021-07-25 (×4): 500000 [IU] via ORAL
  Filled 2021-07-21 (×4): qty 5

## 2021-07-21 MED ORDER — HYDRALAZINE HCL 20 MG/ML IJ SOLN
10.0000 mg | INTRAMUSCULAR | Status: DC | PRN
  Administered 2021-07-22 – 2021-07-24 (×2): 10 mg via INTRAVENOUS
  Filled 2021-07-21 (×4): qty 1

## 2021-07-21 MED ORDER — SODIUM ZIRCONIUM CYCLOSILICATE 10 G PO PACK: 10.0000 g | PACK | Freq: Once | ORAL | Status: DC

## 2021-07-21 MED ORDER — INSULIN ASPART 100 UNIT/ML IJ SOLN
0.0000 [IU] | Freq: Three times a day (TID) | INTRAMUSCULAR | Status: DC
  Administered 2021-07-22: 3 [IU] via SUBCUTANEOUS
  Administered 2021-07-22: 5 [IU] via SUBCUTANEOUS
  Administered 2021-07-24: 1 [IU] via SUBCUTANEOUS

## 2021-07-21 MED ORDER — ACETAMINOPHEN 650 MG RE SUPP: 650.0000 mg | Freq: Four times a day (QID) | RECTAL | Status: DC | PRN

## 2021-07-21 MED ORDER — ACETAMINOPHEN 325 MG PO TABS: 650.0000 mg | ORAL_TABLET | Freq: Four times a day (QID) | ORAL | Status: DC | PRN

## 2021-07-21 NOTE — ED Notes (Signed)
Dr. Sherry Ruffing notified of elevated lactic

## 2021-07-21 NOTE — ED Provider Notes (Signed)
[facility] EMERGENCY DEPARTMENT Provider Note   CSN: [id number] Arrival date & time: 07/21/21  1248     History Chief Complaint  Patient presents with   Altered Mental Status    Beth [name] is a 80 y.o. female.  The history is provided by the patient and medical records. The history is limited by the condition of the patient. No language interpreter was used.  Altered Mental Status Presenting symptoms: confusion, memory loss and unresponsiveness   Severity:  Severe Most recent episode:  Today Episode history:  Single Duration:  1 day Timing:  Constant Progression:  Improving Chronicity:  New Context: recent infection (recent uti)   Context: not recent illness   Associated symptoms: no abdominal pain, no agitation, no fever, no headaches, no light-headedness, no nausea, no palpitations, no rash and no vomiting       Past Medical History:  Diagnosis Date   Arthritis    neck and all over   Cancer ([place]) 1998   hx colon ca-chemo radiation; lung; denies lung involvement    Chronic back pain    Diabetes mellitus without complication ([place])    type 2   History of Raynaud's syndrome    Hyperlipemia    Hypertension    Psoriasis    afflicts bed of fingernails    Sarcoidosis    of the eye; 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 ; reports hasnt had any affliction in years    Wears glasses    Wears partial dentures    top and bottom partials    Patient Active Problem List   Diagnosis Date Noted   Cavitary lesion of lung 06/15/2021   Multiple lung nodules on CT 05/16/2021   Colon cancer ([place]) 05/16/2021   Spinal stenosis, 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   Overweight (BMI 25.0-29.9) 04/15/2018   Essential hypertension 04/15/2018   Cervical spine pain 03/22/2018   Lumbar pain 03/22/2018   Mass 12/23/2017   Hyperlipidemia 06/14/2017   Nail deformity 07/30/2016   Type 2 diabetes mellitus with hyperglycemia, without long-term current use  of insulin ([place]) 04/13/2016    Past Surgical History:  Procedure Laterality Date   ABDOMINAL HYSTERECTOMY     APPENDECTOMY     BRONCHIAL BIOPSY  06/27/2021   Procedure: BRONCHIAL BIOPSIES;  Surgeon: Garner Nash, DO;  Location: [place] [facility];  Service: Pulmonary;;   BRONCHIAL BRUSHINGS  06/27/2021   Procedure: BRONCHIAL BRUSHINGS;  Surgeon: Garner Nash, DO;  Location: [place] [facility];  Service: Pulmonary;;   BRONCHIAL WASHINGS  06/27/2021   Procedure: BRONCHIAL WASHINGS;  Surgeon: Garner Nash, DO;  Location: [place] [facility];  Service: Pulmonary;;   CATARACT EXTRACTION, BILATERAL     COLONOSCOPY     many   COMBINED MEDIASTINOSCOPY AND BRONCHOSCOPY  2002   biopsy   DG ARTHRO THUMB*R*  2011   ablasion rt thumb nailbed   FIDUCIAL MARKER PLACEMENT  06/27/2021   Procedure: FIDUCIAL MARKER PLACEMENT;  Surgeon: Garner Nash, DO;  Location: [facility];  Service: Pulmonary;;   FORAMINOTOMY 1 LEVEL Bilateral 05/09/2018   Procedure: Foraminotomy L4-L5 Root;  Surgeon: [name], MD;  Location: [facility];  Service: Orthopedics;  Laterality: Bilateral;   I & D EXTREMITY Right 07/23/2013   Procedure: IRRIGATION AND DEBRIDEMENT DIP JOINT RIGHT INDEX FINGER, EXCISE MUCOID CYST RIGHT INDEX FINGER  ;  Surgeon: Cammie Sickle., MD;  Location: [place];  Service: Orthopedics;  Laterality: Right;   LIPOMA EXCISION  LUMBAR LAMINECTOMY/DECOMPRESSION MICRODISCECTOMY N/A 05/09/2018   Procedure: Complete decompressive lumbar laminectomy L4-L5;  Surgeon: [name], MD;  Location: [facility];  Service: Orthopedics;  Laterality: N/A;  126mn   MASS EXCISION Right 09/28/2014   Procedure: EXCISION MASS RIGHT MIDDLE FINGER;  Surgeon: GDaryll Brod MD;  [place]  Service: Orthopedics;  Laterality: Right;  ANESTHESIA:  GENERAL, IV REGIONAL FAB   MASS EXCISION Bilateral 12/24/2017   Procedure: EXCISION MASS RIGHT SMALL, LEFT INDEX, excision left nail horn middle  finger;  Surgeon: KDaryll Brod MD;  [place] [facility]  Service: Orthopedics;  Laterality: Bilateral;  FAB   NAILBED REPAIR Left 08/28/2016   Procedure: Ablation nail matrix left index finger andl left middle finger;  Surgeon: GDaryll Brod MD;  [place]  Service: Orthopedics;  Laterality: Left;   PARTIAL COLECTOMY  1998   cancer-rad/chemo   SKIN FULL THICKNESS GRAFT Right 07/23/2013   Procedure: NAIL PLATE ABLATION WITH FULL THICKNESS SKIN GRAFT FROM RIGHT   MEDIAL ELBOW TO RIGHT LONG FINGER NAIL BED;  Surgeon: RCammie Sickle, MD;  [place] [facility]  Service: Orthopedics;  Laterality: Right;   SKIN FULL THICKNESS GRAFT Left 08/28/2016   Procedure: SKIN GRAFT FULL THICKNESS upper left arm;  Surgeon: GDaryll Brod MD;  [place]  Service: Orthopedics;  Laterality: Left;   VIDEO BRONCHOSCOPY WITH ENDOBRONCHIAL NAVIGATION Bilateral 06/27/2021   Procedure: VIDEO BRONCHOSCOPY WITH ENDOBRONCHIAL NAVIGATION;  Surgeon: IGarner Nash DO;  [place]  Service: Pulmonary;  Laterality: Bilateral;  ION, possible fiducial placement   VIDEO BRONCHOSCOPY WITH RADIAL ENDOBRONCHIAL ULTRASOUND  06/27/2021   Procedure: RADIAL ENDOBRONCHIAL ULTRASOUND;  Surgeon: IGarner Nash DO;  [facility];  Service: Pulmonary;;     OB History   No obstetric history on file.     Family History  Problem Relation Age of Onset   Cancer Mother        colon    Hypertension Father    CVA Father    Obesity Brother     Social History   Tobacco Use   Smoking status: Former    Packs/day: 0.50    Types: Cigarettes    Quit date: 07/22/1971    Years since quitting: 50.0   Smokeless tobacco: Never  Vaping Use   Vaping Use: Never used  Substance Use Topics   Alcohol use: No    Alcohol/week: 0.0 standard drinks   Drug use: No    Home Medications Prior to Admission medications   Medication Sig Start  Date End Date Taking? Authorizing Provider  acetaminophen (TYLENOL) 325 MG tablet Take 325-650 mg by mouth 2 (two) times daily as needed for moderate pain.     [provider]  Blood Glucose Monitoring Suppl (ONETOUCH VERIO) w/Device KIT Use as instructed to check blood sugar 4 times daily 05/25/21   GPhilemon Kingdom MD  DROPLET PEN NEEDLES 32G X 5 MM MISC USE TO INJECT INSULIN ONE TIME DAILY AS DIRECTED (NEED APPOINTMENT FOR FURTHER REFILLS) 04/10/21   GPhilemon Kingdom MD  glipiZIDE (GLUCOTROL XL) 10 MG 24 hr tablet TAKE 2 TABLETs in am and 1 tablet before dinner Patient taking differently: Take by mouth daily with breakfast. 1 in am 01/31/21   GPhilemon Kingdom MD  glucose blood (ONETOUCH VERIO) test strip Use as instructed to check blood sugar 4 times daily 05/25/21   GPhilemon Kingdom MD  insulin degludec (TRESIBA FLEXTOUCH) 100 UNIT/ML  FlexTouch Pen INJECT 15 UNITS SUBCUTANEOUSLY DAILY Patient taking differently: at bedtime. INJECT 15 UNITS SUBCUTANEOUSLY DAILY 01/31/21   Philemon Kingdom, MD  Lancets [facility] ULTRASOFT) lancets Use as instructed to check blood sugar 4 times daily 05/25/21   Philemon Kingdom, MD  lisinopril (ZESTRIL) 10 MG tablet Take 10 mg by mouth daily.    Noemi Chapel, MD  loratadine (CLARITIN) 10 MG tablet Take 10 mg by mouth daily as needed for allergies.    [provider]  lovastatin (MEVACOR) 10 MG tablet Take 10 mg by mouth every evening.     [provider]  metFORMIN (GLUCOPHAGE) 1000 MG tablet TAKE 1 TABLET TWICE DAILY WITH MEALS 01/31/21   Philemon Kingdom, MD  Multiple Vitamin (MULTIVITAMIN) tablet Take 1 tablet by mouth daily.    [provider]  ramipril (ALTACE) 10 MG capsule Take 10 mg by mouth daily.    [provider]    Allergies    Patient has no known allergies.  Review of Systems   Review of Systems  Constitutional:  Positive for fatigue. Negative for chills, diaphoresis and fever.  HENT:  Negative  for congestion.   Eyes:  Negative for visual disturbance.  Respiratory:  Negative for cough, chest tightness, shortness of breath and wheezing.   Cardiovascular:  Negative for chest pain and palpitations.  Gastrointestinal:  Negative for abdominal pain, constipation, diarrhea, nausea and vomiting.  Genitourinary:  Negative for dysuria, flank pain and frequency.  Musculoskeletal:  Negative for back pain, neck pain and neck stiffness.  Skin:  Negative for rash and wound.  Neurological:  Negative for light-headedness and headaches.  Psychiatric/Behavioral:  Positive for confusion and memory loss. Negative for agitation.   All other systems reviewed and are negative.  Physical Exam Updated Vital Signs BP (!) 176/64 (BP Location: Right Arm)   Pulse 86   Temp 98.2 F (36.8 C) (Oral)   Resp 19   Ht 5' 2"  (1.575 m)   Wt 67.2 kg   SpO2 95%   BMI 27.10 kg/m   Physical Exam Vitals and nursing note reviewed.  Constitutional:      General: She is not in acute distress.    Appearance: She is well-developed. She is not ill-appearing, toxic-appearing or diaphoretic.  HENT:     Head: Normocephalic and atraumatic.     Mouth/Throat:     Mouth: Mucous membranes are dry.     Pharynx: No oropharyngeal exudate or posterior oropharyngeal erythema.  Eyes:     Conjunctiva/sclera: Conjunctivae normal.     Pupils: Pupils are equal, round, and reactive to light.  Neck:     Vascular: No carotid bruit.  Cardiovascular:     Rate and Rhythm: Normal rate and regular rhythm.     Heart sounds: Murmur heard.  Pulmonary:     Effort: Pulmonary effort is normal. No respiratory distress.     Breath sounds: Normal breath sounds. No wheezing, rhonchi or rales.  Chest:     Chest wall: No tenderness.  Abdominal:     Palpations: Abdomen is soft.     Tenderness: There is no abdominal tenderness. There is no right CVA tenderness, left CVA tenderness, guarding or rebound.  Musculoskeletal:        General: No  tenderness.     Cervical back: Neck supple. No tenderness.  Skin:    General: Skin is warm and dry.     Capillary Refill: Capillary refill takes less than 2 seconds.  Neurological:  Mental Status: She is alert. Mental status is at baseline.     Sensory: No sensory deficit.     Motor: No weakness.    ED Results / Procedures / Treatments   Labs (all labs ordered are listed, but only abnormal results are displayed) Labs Reviewed  CBC WITH DIFFERENTIAL/PLATELET - Abnormal; Notable for the following components:      Result Value   WBC 13.1 (*)    RBC 3.47 (*)    Hemoglobin 10.6 (*)    HCT 32.0 (*)    Neutro Abs 10.1 (*)    All other components within normal limits  COMPREHENSIVE METABOLIC PANEL - Abnormal; Notable for the following components:   Sodium 124 (*)    Potassium 5.8 (*)    Chloride 94 (*)    CO2 12 (*)    Glucose, Bld 156 (*)    BUN 54 (*)    Creatinine, Ser 5.50 (*)    Calcium 8.8 (*)    Albumin 3.3 (*)    GFR, Estimated 7 (*)    Anion gap 18 (*)    All other components within normal limits  LACTIC ACID, PLASMA - Abnormal; Notable for the following components:   Lactic Acid, Venous 5.7 (*)    All other components within normal limits  CBG MONITORING, ED - Abnormal; Notable for the following components:   Glucose-Capillary 136 (*)    All other components within normal limits  RESP PANEL BY RT-PCR (FLU A&B, COVID) [id number]  URINE CULTURE  CULTURE, BLOOD (ROUTINE X 2)  CULTURE, BLOOD (ROUTINE X 2)  LIPASE, BLOOD  TSH  PROTIME-INR  AMMONIA  URINALYSIS, ROUTINE W REFLEX MICROSCOPIC  LACTIC ACID, PLASMA    EKG EKG Interpretation  Date/Time:  Friday July 21 2021 12:59:23 EDT Ventricular Rate:  85 PR Interval:  123 QRS Duration: 105 QT Interval:  371 QTC Calculation: 442 R Axis:   23 Text Interpretation: Sinus rhythm Low voltage, extremity and precordial leads when compared to prior, similar appearance. No STEMI Confirmed by Antony Blackbird [phone]) on  07/21/2021 1:20:06 PM  Radiology CT HEAD WO CONTRAST (5MM)  Result Date: 07/21/2021 CLINICAL DATA:  Altered mental status, multiple falls EXAM: CT HEAD WITHOUT CONTRAST TECHNIQUE: Contiguous axial images were obtained from the base of the skull through the vertex without intravenous contrast. COMPARISON:  Brain MRI 05/28/2021, CT head 07/05/2021 FINDINGS: Brain: There is no evidence of acute intracranial hemorrhage, extra-axial fluid collection, or acute infarct. There is unchanged mild parenchymal volume loss with commensurate enlargement of the ventricular system. Hypodensity in the subcortical and periventricular white matter is unchanged, likely reflecting sequela of chronic white matter microangiopathy. There is no mass lesion.  There is no midline shift. Vascular: There is calcification of the bilateral cavernous ICAs. Skull: Normal. Negative for fracture or focal lesion. Sinuses/Orbits: Soap bubble appearing secretions are again seen in the right sphenoid sinus. Bilateral lens implants are in place. The globes and orbits are otherwise unremarkable. Other: None. IMPRESSION: 1. No acute intracranial pathology. 2. Unchanged parenchymal volume loss and chronic white matter microangiopathy. Cerebral Atrophy (ICD10-G31.9). Electronically Signed   By: Valetta Mole M.D.   On: 07/21/2021 15:03   DG Chest Portable 1 View  Result Date: 07/21/2021 CLINICAL DATA:  Hypoglycemia, altered mental status EXAM: PORTABLE CHEST 1 VIEW COMPARISON:  07/05/2021 chest radiograph. FINDINGS: Surgical clips overlie the left upper quadrant of the abdomen. Fiducial markers overlie the bilateral lung apices. Stable cardiomediastinal silhouette with normal heart size. No pneumothorax. No  pleural effusion. 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 No pulmonary edema. No acute consolidative airspace disease. Chronic mild curvilinear bibasilar scarring. IMPRESSION: 1. No acute cardiopulmonary  disease. 2. 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 3. Chronic mild curvilinear bibasilar scarring. Electronically Signed   By: Ilona Sorrel M.D.   On: 07/21/2021 13:57    Procedures Procedures   CRITICAL CARE Performed by: Gwenyth Allegra Tanveer Brammer Total critical care time: 35 minutes Critical care time was exclusive of separately billable procedures and treating other patients. Critical care was necessary to treat or prevent imminent or life-threatening deterioration. Critical care was time spent personally by me on the following activities: development of treatment plan with patient and/or surrogate as well as nursing, discussions with consultants, evaluation of patient's response to treatment, examination of patient, obtaining history from patient or surrogate, ordering and performing treatments and interventions, ordering and review of laboratory studies, ordering and review of radiographic studies, pulse oximetry and re-evaluation of patient's condition.   Medications Ordered in ED Medications  cefTRIAXone (ROCEPHIN) 1 g in sodium chloride 0.9 % 100 mL IVPB (has no administration in time range)  sodium chloride 0.9 % bolus 1,000 mL (has no administration in time range)    ED Course  I have reviewed the triage vital signs and the nursing notes.  Pertinent labs & imaging results that were available during my care of the patient were reviewed by me and considered in my medical decision making (see chart for details).    MDM Rules/Calculators/A&P                           Beth [name] is a 80 y.o. female with a past medical history significant for hypertension, hyperlipidemia, diabetes, previous colon cancer, previous cavitary lung lesions that were reportedly recently biopsied and told were benign, and recurrent UTIs who presents for unresponsiveness and hypoglycemia.  According to EMS report and daughter report he was at the bedside, patient would not wake up  this morning and EMS was called.  She was unresponsive and EMS found her glucose to be 50.  Patient was given D10 after she was unable to orally take any glucose then and mental status began to improve.  Daughter reports that her glucose was 80 before going to bed last night so they did not give her any of her nighttime insulin.  Otherwise family reports that she has been struggling with waxing and waning altered mental status ever since having a UTI several months ago.  They are still concerned about this.  Therefore patient has had several falls over the last week most recently on Tuesday however she is not on any blood thinners.  Family reports she is still acting somewhat confused.  Patient is not complaining of any hematuria, dysuria, constipation, or diarrhea.  She is not complaining of any chest pain or abdominal pain.  No reported fevers, chills, congestion, cough.   On exam, lungs were clear.  Chest was nontender and abdomen was nontender.  Patient moving all extremities.  No lower extremity edema appreciated.  Patient did appear pale but had intact grip strength and sensation.  Mucous membranes appear dry.  No lacerations or ecchymoses seen on the head.  Clinically I am concerned about the patient's episode of unresponsiveness and hypoglycemia today as she did not take her insulin last night.  With her recent infection, we will check for occult infection  that could have led to this hypoglycemic episode.  Given her recent falls and persistent confusion per family, we will get a CT head.   We will monitor her glucose to see if she needs any further treatment.  Anticipate reassessment after work-up to determine disposition.    2:53 PM Work-up continues to return.  Patient is found to have elevated lactic acid of 5.7 and she also has a leukocytosis.  Also elevated was her kidney function which is up to 5.5 from 2.07 several weeks ago.  Given the family's high suspicion for UTI, the leukocytosis,  the hypoglycemia/altered mental status, the lactic acidosis, and now the AKI, will get blood cultures, give fluids, and give antibiotics and will admit patient for further management.  EKG did not show peaked T waves as potassium is slightly elevated at 5.8.  Patient has not been able to urinate for [place] yet.  We will order in and out catheterization if she will allow it and will call for admission.   Final Clinical Impression(s) / ED Diagnoses Final diagnoses:  AKI (acute kidney injury) ([place])     Clinical Impression: 1. AKI (acute kidney injury) ([place])     Disposition: Admit  This note was prepared with assistance of Dragon voice recognition software. Occasional wrong-word or sound-a-like substitutions may have occurred due to the inherent limitations of voice recognition software.     Willy Pinkerton, Gwenyth Allegra, MD 07/21/21 1531

## 2021-07-21 NOTE — Consult Note (Signed)
Reason for Consult: AKI/CKD stage IIIa Referring Physician: Tamala Julian, MD  Beth [name] is an 80 y.o. female with a PMH significant for HTN, DM type 2, HLD, Raynaud's syndrome, DJD, h/o colon cancer (s/p chemo and radiation, Psoriasis, remote history of Sarcoidosis, recent bilateral cavitary, upper lobe lesions (hypermetabolic on PET scan but negative bronch/biopsy), recurrent UTI's, and CKD stage IIIa who was brought to [facility] ED via EMS from home after being found unresponsive.  CBG was 50 on arrival and given 25 grams of D10.  Her daughter is the main provider of this HPI as her mother has had a significant cognitive decline over the past 4-5 months.  She reports that her mother has been having issues with recurrent UTI's associated with AMS since July requiring multiple rounds of antibiotics with most recent ED visit on 07/05/21.  She was given IVF's due to AKI with Scr of 2.07 and IV rocephin then po keflex to take at home, however urine culture was + for Enterococcus Faecalis and changed to amoxicillin for 5 days, then extended 5 more.    Her daughter reported that her cognitive function started to improve with antibiotics up until 2 days ago where she again had a decline in her cognitive status.  She also had poor po intake and a couple episodes of N/V before presented to the ED.  In the ED, she was noted to be hypertensive at 176/64, SpO2 95%, afebrile, and somnolent.  CXR without acute cardiopulmonary process but did see chronic bilateral cavitary lung lesions.  CT scan of head without acute intracranial pathology.  Labs were notable for WBC 13.1, Hgb 10.6, Na 124, K 5.8, Cl 94, Co2 12, BUN 54, Cr 5.5, Ca 8.8, alb 3.3, and lactate 5.7.  She was given IVF's and started on rocephin and admitted for further evaluation.  We were consulted to further evaluate her AKI/CKD Stage IIIa, as well as her other electrolyte abnormalities.  The trend in Scr is seen below.  Of note, she had been taking a PPI, ramipril,  and metformin prior to admission, as well as 10 days of amoxicillin.  Her daughter denies any fevers or rash, lower extremity edema, or previous knowledge of her CKD stage IIIa.    Trend in Creatinine:  Creatinine, Ser  Date/Time Value Ref Range Status  07/21/2021 01:30 PM 5.50 (H) 0.44 - 1.00 mg/dL Final  07/05/2021 06:45 AM 2.07 (H) 0.44 - 1.00 mg/dL Final  06/13/2021 11:55 AM 1.32 (H) 0.44 - 1.00 mg/dL Final  05/10/2021 12:48 PM 1.25 (H) 0.44 - 1.00 mg/dL Final  04/08/2021 12:01 PM 1.41 (H) 0.44 - 1.00 mg/dL Final  05/02/2018 08:56 AM 1.26 (H) 0.44 - 1.00 mg/dL Final  12/24/2017 09:43 AM 1.10 (H) 0.44 - 1.00 mg/dL Final  09/22/2016 02:34 PM 0.91 0.44 - 1.00 mg/dL Final  08/24/2016 03:43 PM 0.89 0.44 - 1.00 mg/dL Final  07/18/2015 01:50 PM 0.87 0.40 - 1.20 mg/dL Final  09/27/2014 02:30 PM 0.92 0.50 - 1.10 mg/dL Final  07/21/2013 02:35 PM 0.85 0.50 - 1.10 mg/dL Final  07/24/2010 04:00 PM 0.79 0.4 - 1.2 mg/dL Final    PMH:   Past Medical History:  Diagnosis Date   Arthritis    neck and all over   Cancer ([place]) 1998   hx colon ca-chemo radiation; lung; denies lung involvement    Chronic back pain    Diabetes mellitus without complication ([place])    type 2   History of Raynaud's syndrome    Hyperlipemia  Hypertension    Psoriasis    afflicts bed of fingernails    Sarcoidosis    of the eye; 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 ; reports hasnt had any affliction in years    Wears glasses    Wears partial dentures    top and bottom partials    PSH:   Past Surgical History:  Procedure Laterality Date   ABDOMINAL HYSTERECTOMY     APPENDECTOMY     BRONCHIAL BIOPSY  06/27/2021   Procedure: BRONCHIAL BIOPSIES;  Surgeon: Garner Nash, DO;  Location: [facility];  Service: Pulmonary;;   BRONCHIAL BRUSHINGS  06/27/2021   Procedure: BRONCHIAL BRUSHINGS;  Surgeon: Garner Nash, DO;  Location: [facility];  Service: Pulmonary;;   BRONCHIAL WASHINGS  06/27/2021   Procedure: BRONCHIAL  WASHINGS;  Surgeon: Garner Nash, DO;  Location: [facility];  Service: Pulmonary;;   CATARACT EXTRACTION, BILATERAL     COLONOSCOPY     many   COMBINED MEDIASTINOSCOPY AND BRONCHOSCOPY  2002   biopsy   DG ARTHRO THUMB*R*  2011   ablasion rt thumb nailbed   FIDUCIAL MARKER PLACEMENT  06/27/2021   Procedure: FIDUCIAL MARKER PLACEMENT;  Surgeon: Garner Nash, DO;  Location: [facility];  Service: Pulmonary;;   FORAMINOTOMY 1 LEVEL Bilateral 05/09/2018   Procedure: Foraminotomy L4-L5 Root;  Surgeon: [name], MD;  Location: [facility];  Service: Orthopedics;  Laterality: Bilateral;   I & D EXTREMITY Right 07/23/2013   Procedure: IRRIGATION AND DEBRIDEMENT DIP JOINT RIGHT INDEX FINGER, EXCISE MUCOID CYST RIGHT INDEX FINGER  ;  Surgeon: Cammie Sickle., MD;  Location: [facility];  Service: Orthopedics;  Laterality: Right;   LIPOMA EXCISION     LUMBAR LAMINECTOMY/DECOMPRESSION MICRODISCECTOMY N/A 05/09/2018   Procedure: Complete decompressive lumbar laminectomy L4-L5;  Surgeon: [name], MD;  Location: [facility];  Service: Orthopedics;  Laterality: N/A;  114mn   MASS EXCISION Right 09/28/2014   Procedure: EXCISION MASS RIGHT MIDDLE FINGER;  Surgeon: GDaryll Brod MD;  [place] [facility]  Service: Orthopedics;  Laterality: Right;  ANESTHESIA:  GENERAL, IV REGIONAL FAB   MASS EXCISION Bilateral 12/24/2017   Procedure: EXCISION MASS RIGHT SMALL, LEFT INDEX, excision left nail horn middle finger;  Surgeon: KDaryll Brod MD;  [facility] [facility]  Service: Orthopedics;  Laterality: Bilateral;  FAB   NAILBED REPAIR Left 08/28/2016   Procedure: Ablation nail matrix left index finger andl left middle finger;  Surgeon: GDaryll Brod MD;  [place] [facility]  Service: Orthopedics;  Laterality: Left;   PARTIAL COLECTOMY  1998   cancer-rad/chemo   SKIN FULL THICKNESS GRAFT Right 07/23/2013   Procedure: NAIL PLATE ABLATION WITH FULL  THICKNESS SKIN GRAFT FROM RIGHT   MEDIAL ELBOW TO RIGHT LONG FINGER NAIL BED;  Surgeon: RCammie Sickle, MD;  [place] [facility]  Service: Orthopedics;  Laterality: Right;   SKIN FULL THICKNESS GRAFT Left 08/28/2016   Procedure: SKIN GRAFT FULL THICKNESS upper left arm;  Surgeon: GDaryll Brod MD;  [place] [facility]  Service: Orthopedics;  Laterality: Left;   VIDEO BRONCHOSCOPY WITH ENDOBRONCHIAL NAVIGATION Bilateral 06/27/2021   Procedure: VIDEO BRONCHOSCOPY WITH ENDOBRONCHIAL NAVIGATION;  Surgeon: IGarner Nash DO;  [place] [facility]  Service: Pulmonary;  Laterality: Bilateral;  ION, possible fiducial placement   VIDEO BRONCHOSCOPY WITH RADIAL ENDOBRONCHIAL ULTRASOUND  06/27/2021   Procedure: RADIAL ENDOBRONCHIAL ULTRASOUND;  Surgeon: IGarner Nash DO;  [facility];  Service: Pulmonary;;    Allergies: No Known Allergies  Medications:   Prior to Admission medications   Medication Sig Start Date End Date Taking? Authorizing Provider  acetaminophen (TYLENOL) 325 MG tablet Take 325-650 mg by mouth 2 (two) times daily as needed for moderate pain.    Yes [provider]  amLODipine (NORVASC) 5 MG tablet Take 5 mg by mouth daily.   Yes [provider]  AZO-CRANBERRY PO Take 1 tablet by mouth daily.   Yes [provider]  D-MANNOSE PO Take 1,300 mg by mouth daily.   Yes [provider]  famotidine (PEPCID) 20 MG tablet Take 20 mg by mouth 2 (two) times daily.   Yes [provider]  Flavoring Agent (MARSHMALLOW ARTIFICIAL FLAVOR) CONC Take 1 capsule by mouth daily.   Yes [provider]  glipiZIDE (GLUCOTROL XL) 10 MG 24 hr tablet TAKE 2 TABLETs in am and 1 tablet before dinner Patient taking differently: Take 10-20 mg by mouth in the morning and at bedtime. Take 42m in the morning and 172mat night. 01/31/21  Yes GhPhilemon KingdomMD  insulin degludec (TRESIBA FLEXTOUCH) 100 UNIT/ML  FlexTouch Pen INJECT 15 UNITS SUBCUTANEOUSLY DAILY Patient taking differently: Inject 15 Units into the skin at bedtime as needed (high blood sugar levels). 01/31/21  Yes GhPhilemon KingdomMD  metFORMIN (GLUCOPHAGE) 1000 MG tablet TAKE 1 TABLET TWICE DAILY WITH MEALS Patient taking differently: Take 1,000 mg by mouth 2 (two) times daily with a meal. 01/31/21  Yes GhPhilemon KingdomMD  Multiple Vitamin (MULTIVITAMIN) tablet Take 1 tablet by mouth daily.   Yes [provider]  ramipril (ALTACE) 10 MG capsule Take 10 mg by mouth daily.   Yes [provider]  Blood Glucose Monitoring Suppl (ONETOUCH VERIO) w/Device KIT Use as instructed to check blood sugar 4 times daily 05/25/21   GhPhilemon KingdomMD  DROPLET PEN NEEDLES 32G X 5 MM MISC USE TO INJECT INSULIN ONE TIME DAILY AS DIRECTED (NEED APPOINTMENT FOR FURTHER REFILLS) 04/10/21   GhPhilemon KingdomMD  glucose blood (ONETOUCH VERIO) test strip Use as instructed to check blood sugar 4 times daily 05/25/21   GhPhilemon KingdomMD  Lancets (OMerit Health MadisonLTRASOFT) lancets Use as instructed to check blood sugar 4 times daily 05/25/21   GhPhilemon KingdomMD    Inpatient medications:  amLODipine  5 mg Oral Daily   [START ON 07/22/2021] famotidine  20 mg Oral QHS   [START ON 07/22/2021] insulin aspart  0-6 Units Subcutaneous TID WC   nystatin  5 mL Oral QID   sodium chloride flush  3 mL Intravenous Q12H   sodium zirconium cyclosilicate  10 g Oral Once    Discontinued Meds:   Medications Discontinued During This Encounter  Medication Reason   lisinopril (ZESTRIL) 10 MG tablet Patient Preference   lovastatin (MEVACOR) 10 MG tablet Patient Preference   loratadine (CLARITIN) 10 MG tablet Patient Preference   cefTRIAXone (ROCEPHIN) 1 g in sodium chloride 0.9 % 100 mL IVPB    famotidine (PEPCID) IVPB 20 mg premix     Social History:  reports that she quit smoking about 50 years ago. Her smoking use included cigarettes. She smoked an  average of .5 packs per day. She has never used smokeless tobacco. She reports that she does not drink alcohol and does not use drugs.  Family History:   Family History  Problem Relation Age of Onset   Cancer Mother  colon    Hypertension Father    CVA Father    Obesity Brother     Review of systems not obtained due to patient factors. Weight change:   Intake/Output Summary (Last 24 hours) at 07/21/2021 2007 Last data filed at 07/21/2021 1800 Gross per 24 hour  Intake 1000 ml  Output --  Net 1000 ml   BP (!) 169/57   Pulse 81   Temp 98.2 F (36.8 C) (Oral)   Resp (!) 21   Ht _0  (1.575 m)   Wt 67.2 kg   SpO2 100%   BMI 27.10 kg/m  Vitals:   07/21/21 1257 07/21/21 1258 07/21/21 1345  BP: (!) 176/64  (!) 169/57  Pulse: 86  81  Resp: 19  (!) 21  Temp: 98.2 F (36.8 C)    TempSrc: Oral    SpO2: 95%  100%  Weight:  67.2 kg   Height:  _1  (1.575 m)      General appearance: delirious, distracted, no distress, and slowed mentation Head: Normocephalic, without obvious abnormality, atraumatic Resp: clear to auscultation bilaterally Cardio: regular rate and rhythm, S1, S2 normal, no murmur, click, rub or gallop GI: soft, non-tender; bowel sounds normal; no masses,  no organomegaly Extremities: extremities normal, atraumatic, no cyanosis or edema  Labs: Basic Metabolic Panel: Recent Labs  Lab 07/21/21 1330  NA 124*  K 5.8*  CL 94*  CO2 12*  GLUCOSE 156*  BUN 54*  CREATININE 5.50*  ALBUMIN 3.3*  CALCIUM 8.8*   Liver Function Tests: Recent Labs  Lab 07/21/21 1330  AST 27  ALT 19  ALKPHOS 88  BILITOT 0.8  PROT 6.5  ALBUMIN 3.3*   Recent Labs  Lab 07/21/21 1330  LIPASE 25   Recent Labs  Lab 07/21/21 1330  AMMONIA 12   CBC: Recent Labs  Lab 07/21/21 1330  WBC 13.1*  NEUTROABS 10.1*  HGB 10.6*  HCT 32.0*  MCV 92.2  PLT 287   PT/INR: _2 (inr:5) Cardiac Enzymes: ) Recent Labs  Lab 07/21/21 1857  CKTOTAL 82    CBG: Recent Labs  Lab 07/21/21 1348  GLUCAP 136*    Iron Studies: No results for input(s): IRON, TIBC, TRANSFERRIN, FERRITIN in the last 168 hours.  Xrays/Other Studies: CT HEAD WO CONTRAST (5MM)  Result Date: 07/21/2021 CLINICAL DATA:  Altered mental status, multiple falls EXAM: CT HEAD WITHOUT CONTRAST TECHNIQUE: Contiguous axial images were obtained from the base of the skull through the vertex without intravenous contrast. COMPARISON:  Brain MRI 05/28/2021, CT head 07/05/2021 FINDINGS: Brain: There is no evidence of acute intracranial hemorrhage, extra-axial fluid collection, or acute infarct. There is unchanged mild parenchymal volume loss with commensurate enlargement of the ventricular system. Hypodensity in the subcortical and periventricular white matter is unchanged, likely reflecting sequela of chronic white matter microangiopathy. There is no mass lesion.  There is no midline shift. Vascular: There is calcification of the bilateral cavernous ICAs. Skull: Normal. Negative for fracture or focal lesion. Sinuses/Orbits: Soap bubble appearing secretions are again seen in the right sphenoid sinus. Bilateral lens implants are in place. The globes and orbits are otherwise unremarkable. Other: None. IMPRESSION: 1. No acute intracranial pathology. 2. Unchanged parenchymal volume loss and chronic white matter microangiopathy. Cerebral Atrophy (ICD10-G31.9). Electronically Signed   By: Valetta Mole M.D.   On: 07/21/2021 15:03   DG Chest Portable 1 View  Result Date: 07/21/2021 CLINICAL DATA:  Hypoglycemia, altered mental status EXAM: PORTABLE CHEST 1 VIEW COMPARISON:  07/05/2021 chest  radiograph. FINDINGS: Surgical clips overlie the left upper quadrant of the abdomen. Fiducial markers overlie the bilateral lung apices. Stable cardiomediastinal silhouette with normal heart size. No pneumothorax. No pleural effusion. 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  No pulmonary edema. No acute consolidative airspace disease. Chronic mild curvilinear bibasilar scarring. IMPRESSION: 1. No acute cardiopulmonary disease. 2. 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 3. Chronic mild curvilinear bibasilar scarring. Electronically Signed   By: Ilona Sorrel M.D.   On: 07/21/2021 13:57     Assessment/Plan:  AKI/CKD stage IIIa - in an elderly woman who has had multiple episodes of recurrent UTI with abx, AMS, poor po intake, N/V, and concomitant ACE inhibition.  Likely some ischemic ATN as underlying issue as her Scr was elevated 2 weeks ago with similar presentation, however also on DDx would be pyelonephritis, AIN, possible pulmonary renal syndrome, or acute GN.  Will order renal US to r/o obstruction and FeNa ordered.  Her daughter denies any NSAID or COX-II I use but is on several OTC supplements.  Stop ramipril and metformin Continue with IVF's for now and follow UOP and daily Scr Renal US to r/o obstruction and evaluate for possible pyelonephritis (may need CT scan) Serologies and complements ordered to r/o acute GN or vasculitis. No acute indication for dialysis at this time but will continue to follow closely. Anion gap metabolic acidosis - pt had been taking metformin with elevated lactate level as well as AKI.  Daughter denies any other ingestion as a source.  Stop metformin and agree with starting isotonic bicarb drip.  Daughter also concerned about her mother eating expired [employer] peanut butter which had been recalled for concern of salmonella, however she has not had any diarrhea prior to admission. Hyperkalemia - due to #1 and #2.  Give lokelma and follow after IV bicarb. 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  Worsening AMS likely multifactorial delirium due to hyponatremia, hypoglycemia, AKI, and possible recurrent UTI.  Will continue to follow.  Recurrent UTI's with recent course of amoxicillin.  Repeat urine culture and may need to perform CT scan of abdomen and pelvis to r/o stones or abscess. DM type 2 - with hypoglycemia.  Improved after amp of D10.  Continue to hold glipizide, metformin, and tresiba for now and follow.  Likely due to poor po intake. HTN - bp elevated.  Hold ramipril and continue with amlodipine 5 mg daily. Cavitary pulmonary lesions - negative workup thus far.  Will send anti-GBM and ANCA.   Governor Rooks Von Quintanar 07/21/2021, 8:07 PM

## 2021-07-21 NOTE — ED Triage Notes (Signed)
Pt BIB ems from home; called put for unresponsiveness; cbg 50 on ems arrival, 25 grams D10 given, last check 250; Hx DM; daughter endorses AMS x 1 week; recently treated for UTI; daughter endorses recent falls, bruising to back of head form fall on Tuesday; pt not on thinners; alert to self only at present

## 2021-07-21 NOTE — H&P (Addendum)
History and Physical    Beth [name]:[id number] DOB: 1941-03-14 DOA: 07/21/2021  Referring MD/NP/PA:Christopher Tegeler, MD PCP: [facility] [place]  Patient coming from: home(lives with husband) via EMS  Chief Complaint: Unresponsiveness  I have personally briefly reviewed patient's old medical records in [facility]   HPI: Beth [name] is a 80 y.o. female with medical history significant of hypertension, hyperlipidemia, diabetes mellitus type 2, sarcoidosis with prior history of and possibly lung involved, history of Raynaud's, colon cancer s/p chemoradiation who presents after being found unresponsive this morning by her husband.  He checked her blood sugar and at that time her glucose was 51.  He was unable to get her leg up and therefore called EMS wake up and therefore called EMS.  Daughter notes that last night her blood sugar was in the 80s and they did not give her Tyler Aas, but she did take her nighttime glipizide.  Review of records note that the patient had episode of hypoglycemia back in August as well.  Her daughter notes they have never been told to stop glipizide.  Her daughter notes that the patient previously had been cognitively intact, but ever since the urinary tract infection in July she has had a progressive decline.  Daughter notes that she has had recurrent urinary tract infections for which she has been treated with cephalexin more than once.  Most recently had just completed a 10-day course of amoxicillin after cultures came back for Enterococcus from urine culture from 10/12.  Despite this patient has still remained confused and during her prior course of antibiotics even had hallucinated for some time.  Patient has been being followed by pulmonology for these cavitary lesions and underwent biopsy which showed no signs of malignancy.  Lesions were not thought to be associated with her prior history of sarcoidosis.  Due to her neurological  decline she had been evaluated by neurology and had MRI which noted mild to moderate generalized cortical atrophy with chronic microvascular ischemic change, some scattered chronic microhemorrhages which this could be consistent with Altheimer's disease or normal cerebral amyloid angiopathy, but no acute findings.  Her daughter states that the patient has no appetite and will not eat or drink.  She has had several falls due to weakness and hit the back of her head when she fell last 3 days ago.  She is concerned for her getting worse.  Other associated symptoms included episodes of nausea, vomiting, hiccups, and swelling of her extremities.  Patient asked for her father prior to me coming in the room daughter notes who has been deceased for quite some time.  Incidentally patient daughter also brings up that the patient and her husband had still been eating expired Jif peanut butter which was recalled due to concern for Salmonella.  ED Course: Upon admission into the emergency department patient was seen to be afebrile with respirations 19-21, blood pressure 169/57-176/64, and O2 saturation maintained on room air.  Labs significant for WBC 13.1, hemoglobin 10.6, sodium 124, potassium 5.8, CO2 12, BUN 54, creatinine 5.5, glucose 156, and lactic acid 5.7.  Chest x-ray showed no acute pulmonary disease and known chronic cavitary lesions at both lung apices.  CT scan of the head showed no acute abnormality.  Patient had been ordered 1 L normal saline IV fluids and empiric antibiotics of Rocephin.  Review of Systems  Unable to perform ROS: Mental status change  Constitutional:  Negative for fever.  Eyes:  Negative for  photophobia and pain.  Cardiovascular:  Negative for chest pain.  Gastrointestinal:  Positive for nausea and vomiting.   Past Medical History:  Diagnosis Date   Arthritis    neck and all over   Cancer ([place]) 1998   hx colon ca-chemo radiation; lung; denies lung involvement    Chronic back  pain    Diabetes mellitus without complication ([place])    type 2   History of Raynaud's syndrome    Hyperlipemia    Hypertension    Psoriasis    afflicts bed of fingernails    Sarcoidosis    of the eye; 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 ; reports hasnt had any affliction in years    Wears glasses    Wears partial dentures    top and bottom partials    Past Surgical History:  Procedure Laterality Date   ABDOMINAL HYSTERECTOMY     APPENDECTOMY     BRONCHIAL BIOPSY  06/27/2021   Procedure: BRONCHIAL BIOPSIES;  Surgeon: Garner Nash, DO;  Location: [facility];  Service: Pulmonary;;   BRONCHIAL BRUSHINGS  06/27/2021   Procedure: BRONCHIAL BRUSHINGS;  Surgeon: Garner Nash, DO;  Location: [facility];  Service: Pulmonary;;   BRONCHIAL WASHINGS  06/27/2021   Procedure: BRONCHIAL WASHINGS;  Surgeon: Garner Nash, DO;  Location: [facility];  Service: Pulmonary;;   CATARACT EXTRACTION, BILATERAL     COLONOSCOPY     many   COMBINED MEDIASTINOSCOPY AND BRONCHOSCOPY  2002   biopsy   DG ARTHRO THUMB*R*  2011   ablasion rt thumb nailbed   FIDUCIAL MARKER PLACEMENT  06/27/2021   Procedure: FIDUCIAL MARKER PLACEMENT;  Surgeon: Garner Nash, DO;  Location: [facility];  Service: Pulmonary;;   FORAMINOTOMY 1 LEVEL Bilateral 05/09/2018   Procedure: Foraminotomy L4-L5 Root;  Surgeon: [name], MD;  Location: [facility];  Service: Orthopedics;  Laterality: Bilateral;   I & D EXTREMITY Right 07/23/2013   Procedure: IRRIGATION AND DEBRIDEMENT DIP JOINT RIGHT INDEX FINGER, EXCISE MUCOID CYST RIGHT INDEX FINGER  ;  Surgeon: Cammie Sickle., MD;  Location: [facility];  Service: Orthopedics;  Laterality: Right;   LIPOMA EXCISION     LUMBAR LAMINECTOMY/DECOMPRESSION MICRODISCECTOMY N/A 05/09/2018   Procedure: Complete decompressive lumbar laminectomy L4-L5;  Surgeon: [name], MD;  Location: [facility];  Service: Orthopedics;  Laterality: N/A;  134mn   MASS EXCISION Right  09/28/2014   Procedure: EXCISION MASS RIGHT MIDDLE FINGER;  Surgeon: GDaryll Brod MD;  [place] [facility]  Service: Orthopedics;  Laterality: Right;  ANESTHESIA:  GENERAL, IV REGIONAL FAB   MASS EXCISION Bilateral 12/24/2017   Procedure: EXCISION MASS RIGHT SMALL, LEFT INDEX, excision left nail horn middle finger;  Surgeon: KDaryll Brod MD;  [facility]  Service: Orthopedics;  Laterality: Bilateral;  FAB   NAILBED REPAIR Left 08/28/2016   Procedure: Ablation nail matrix left index finger andl left middle finger;  Surgeon: GDaryll Brod MD;  [place] [facility]  Service: Orthopedics;  Laterality: Left;   PARTIAL COLECTOMY  1998   cancer-rad/chemo   SKIN FULL THICKNESS GRAFT Right 07/23/2013   Procedure: NAIL PLATE ABLATION WITH FULL THICKNESS SKIN GRAFT FROM RIGHT   MEDIAL ELBOW TO RIGHT LONG FINGER NAIL BED;  Surgeon: RCammie Sickle, MD;  [place] [facility]  Service: Orthopedics;  Laterality: Right;   SKIN FULL THICKNESS GRAFT Left 08/28/2016   Procedure: SKIN GRAFT FULL THICKNESS upper left arm;  Surgeon: Daryll Brod, MD;  Location: [facility];  Service: Orthopedics;  Laterality: Left;   VIDEO BRONCHOSCOPY WITH ENDOBRONCHIAL NAVIGATION Bilateral 06/27/2021   Procedure: VIDEO BRONCHOSCOPY WITH ENDOBRONCHIAL NAVIGATION;  Surgeon: Garner Nash, DO;  Location: [facility];  Service: Pulmonary;  Laterality: Bilateral;  ION, possible fiducial placement   VIDEO BRONCHOSCOPY WITH RADIAL ENDOBRONCHIAL ULTRASOUND  06/27/2021   Procedure: RADIAL ENDOBRONCHIAL ULTRASOUND;  Surgeon: Garner Nash, DO;  Location: [facility];  Service: Pulmonary;;     reports that she quit smoking about 50 years ago. Her smoking use included cigarettes. She smoked an average of .5 packs per day. She has never used smokeless tobacco. She reports that she does not drink alcohol and does not use drugs.  No Known Allergies  Family  History  Problem Relation Age of Onset   Cancer Mother        colon    Hypertension Father    CVA Father    Obesity Brother     Prior to Admission medications   Medication Sig Start Date End Date Taking? Authorizing Provider  acetaminophen (TYLENOL) 325 MG tablet Take 325-650 mg by mouth 2 (two) times daily as needed for moderate pain.    Yes [provider]  amLODipine (NORVASC) 5 MG tablet Take 5 mg by mouth daily.   Yes [provider]  AZO-CRANBERRY PO Take 1 tablet by mouth daily.   Yes [provider]  D-MANNOSE PO Take 1,300 mg by mouth daily.   Yes [provider]  famotidine (PEPCID) 20 MG tablet Take 20 mg by mouth 2 (two) times daily.   Yes [provider]  Flavoring Agent (MARSHMALLOW ARTIFICIAL FLAVOR) CONC Take 1 capsule by mouth daily.   Yes [provider]  glipiZIDE (GLUCOTROL XL) 10 MG 24 hr tablet TAKE 2 TABLETs in am and 1 tablet before dinner Patient taking differently: Take 10-20 mg by mouth in the morning and at bedtime. Take 29m in the morning and 167mat night. 01/31/21  Yes GhPhilemon KingdomMD  insulin degludec (TRESIBA FLEXTOUCH) 100 UNIT/ML FlexTouch Pen INJECT 15 UNITS SUBCUTANEOUSLY DAILY Patient taking differently: Inject 15 Units into the skin at bedtime as needed (high blood sugar levels). 01/31/21  Yes GhPhilemon KingdomMD  metFORMIN (GLUCOPHAGE) 1000 MG tablet TAKE 1 TABLET TWICE DAILY WITH MEALS Patient taking differently: Take 1,000 mg by mouth 2 (two) times daily with a meal. 01/31/21  Yes GhPhilemon KingdomMD  Multiple Vitamin (MULTIVITAMIN) tablet Take 1 tablet by mouth daily.   Yes [provider]  ramipril (ALTACE) 10 MG capsule Take 10 mg by mouth daily.   Yes [provider]  Blood Glucose Monitoring Suppl (ONETOUCH VERIO) w/Device KIT Use as instructed to check blood sugar 4 times daily 05/25/21   GhPhilemon KingdomMD  DROPLET PEN NEEDLES 32G X 5 MM MISC USE TO INJECT  INSULIN ONE TIME DAILY AS DIRECTED (NEED APPOINTMENT FOR FURTHER REFILLS) 04/10/21   GhPhilemon KingdomMD  glucose blood (ONETOUCH VERIO) test strip Use as instructed to check blood sugar 4 times daily 05/25/21   GhPhilemon KingdomMD  Lancets ([facility]TR[facility]) lancets Use as instructed to check blood sugar 4 times daily 05/25/21   GhPhilemon KingdomMD    Physical Exam:  Constitutional: Elderly female who appears to have a little trouble with following certain commands Vitals:   07/21/21 1257 07/21/21 1258 07/21/21 1345  BP: (!) 176/64  (!) 169/57  Pulse: 86  81  Resp:  19  (!) 21  Temp: 98.2 F (36.8 C)    TempSrc: Oral    SpO2: 95%  100%  Weight:  67.2 kg   Height:  _0  (1.575 m)    Eyes: PERRL, lids and conjunctivae normal ENMT: Mucous membranes are dry.  Some scattered white plaques noted of the upper palate.  Implants noted of the lower palate Neck: normal, supple, no masses, no thyromegaly Respiratory: clear to auscultation bilaterally, no wheezing, no crackles. Normal respiratory effort. No accessory muscle use.  Cardiovascular: Regular rate and rhythm, no murmurs / rubs / gallops. No extremity edema. 2+ pedal pulses. No carotid bruits.  Abdomen: no tenderness, no masses palpated. No hepatosplenomegaly. Bowel sounds positive.  Musculoskeletal: no clubbing / cyanosis. No joint deformity upper and lower extremities. Good ROM, no contractures. Normal muscle tone.  Skin: no rashes, lesions, ulcers. No induration.  Poor skin turgor. Neurologic: CN 2-12 grossly intact.  Able to move all extremities. Psychiatric:  Alert and oriented x person and place.  Anxious mood     Labs on Admission: I have personally reviewed following labs and imaging studies  CBC: Recent Labs  Lab 07/21/21 1330  WBC 13.1*  NEUTROABS 10.1*  HGB 10.6*  HCT 32.0*  MCV 92.2  PLT 967   Basic Metabolic Panel: Recent Labs  Lab 07/21/21 1330  NA 124*  K 5.8*  CL 94*  CO2 12*  GLUCOSE 156*  BUN  54*  CREATININE 5.50*  CALCIUM 8.8*   GFR: Estimated Creatinine Clearance: 7.5 mL/min (A) (by C-G formula based on SCr of 5.5 mg/dL (H)). Liver Function Tests: Recent Labs  Lab 07/21/21 1330  AST 27  ALT 19  ALKPHOS 88  BILITOT 0.8  PROT 6.5  ALBUMIN 3.3*   Recent Labs  Lab 07/21/21 1330  LIPASE 25   Recent Labs  Lab 07/21/21 1330  AMMONIA 12   Coagulation Profile: Recent Labs  Lab 07/21/21 1330  INR 1.0   Cardiac Enzymes: No results for input(s): CKTOTAL, CKMB, CKMBINDEX, TROPONINI in the last 168 hours. BNP (last 3 results) No results for input(s): PROBNP in the last 8760 hours. HbA1C: No results for input(s): HGBA1C in the last 72 hours. CBG: Recent Labs  Lab 07/21/21 1348  GLUCAP 136*   Lipid Profile: No results for input(s): CHOL, HDL, LDLCALC, TRIG, CHOLHDL, LDLDIRECT in the last 72 hours. Thyroid Function Tests: Recent Labs    07/21/21 1330  TSH 2.983   Anemia Panel: No results for input(s): VITAMINB12, FOLATE, FERRITIN, TIBC, IRON, RETICCTPCT in the last 72 hours. Urine analysis:    Component Value Date/Time   COLORURINE YELLOW 07/05/2021 [place] 07/05/2021 0927   LABSPEC 1.015 07/05/2021 0927   PHURINE 6.0 07/05/2021 0927   GLUCOSEU 100 (A) 07/05/2021 0927   HGBUR NEGATIVE 07/05/2021 0927   BILIRUBINUR NEGATIVE 07/05/2021 0927   KETONESUR 15 (A) 07/05/2021 0927   PROTEINUR 30 (A) 07/05/2021 0927   NITRITE NEGATIVE 07/05/2021 0927   LEUKOCYTESUR TRACE (A) 07/05/2021 0927   Sepsis Labs: Recent Results (from the past 240 hour(s))  Resp Panel by RT-PCR (Flu A&B, Covid) Nasopharyngeal Swab     Status: None   Collection Time: 07/21/21  2:02 PM   Specimen: Nasopharyngeal Swab; Nasopharyngeal(NP) swabs in vial transport medium  Result Value Ref Range Status   SARS Coronavirus 2 by RT PCR NEGATIVE NEGATIVE Final    Comment: (NOTE) SARS-CoV-2 target nucleic acids are NOT DETECTED.  The SARS-CoV-2 RNA is generally  detectable  in upper respiratory specimens during the acute phase of infection. The lowest concentration of SARS-CoV-2 viral copies this assay can detect is 138 copies/mL. A negative result does not preclude SARS-Cov-2 infection and should not be used as the sole basis for treatment or other patient management decisions. A negative result may occur with  improper specimen collection/handling, submission of specimen other than nasopharyngeal swab, presence of viral mutation(s) within the areas targeted by this assay, and inadequate number of viral copies(<138 copies/mL). A negative result must be combined with clinical observations, patient history, and epidemiological information. The expected result is Negative.  Fact Sheet for Patients:  EntrepreneurPulse.com.au  Fact Sheet for Healthcare Providers:  IncredibleEmployment.be  This test is no t yet approved or cleared by the [place] FDA and  has been authorized for detection and/or diagnosis of SARS-CoV-2 by FDA under an Emergency Use Authorization (EUA). This EUA will remain  in effect (meaning this test can be used) for the duration of the COVID-19 declaration under Section 564(b)(1) of the Act, 21 U.S.C.section 360bbb-3(b)(1), unless the authorization is terminated  or revoked sooner.       Influenza A by PCR NEGATIVE NEGATIVE Final   Influenza B by PCR NEGATIVE NEGATIVE Final    Comment: (NOTE) The Xpert Xpress SARS-CoV-2/FLU/RSV plus assay is intended as an aid in the diagnosis of influenza from Nasopharyngeal swab specimens and should not be used as a sole basis for treatment. Nasal washings and aspirates are unacceptable for Xpert Xpress SARS-CoV-2/FLU/RSV testing.  Fact Sheet for Patients: EntrepreneurPulse.com.au  Fact Sheet for Healthcare Providers: IncredibleEmployment.be  This test is not yet approved or cleared by the [place] FDA  and has been authorized for detection and/or diagnosis of SARS-CoV-2 by FDA under an Emergency Use Authorization (EUA). This EUA will remain in effect (meaning this test can be used) for the duration of the COVID-19 declaration under Section 564(b)(1) of the Act, 21 U.S.C. section 360bbb-3(b)(1), unless the authorization is terminated or revoked.  Performed at [facility], [place] [address], [place], [place] [address]      Radiological Exams on Admission: CT HEAD WO CONTRAST (5MM)  Result Date: 07/21/2021 CLINICAL DATA:  Altered mental status, multiple falls EXAM: CT HEAD WITHOUT CONTRAST TECHNIQUE: Contiguous axial images were obtained from the base of the skull through the vertex without intravenous contrast. COMPARISON:  Brain MRI 05/28/2021, CT head 07/05/2021 FINDINGS: Brain: There is no evidence of acute intracranial hemorrhage, extra-axial fluid collection, or acute infarct. There is unchanged mild parenchymal volume loss with commensurate enlargement of the ventricular system. Hypodensity in the subcortical and periventricular white matter is unchanged, likely reflecting sequela of chronic white matter microangiopathy. There is no mass lesion.  There is no midline shift. Vascular: There is calcification of the bilateral cavernous ICAs. Skull: Normal. Negative for fracture or focal lesion. Sinuses/Orbits: Soap bubble appearing secretions are again seen in the right sphenoid sinus. Bilateral lens implants are in place. The globes and orbits are otherwise unremarkable. Other: None. IMPRESSION: 1. No acute intracranial pathology. 2. Unchanged parenchymal volume loss and chronic white matter microangiopathy. Cerebral Atrophy (ICD10-G31.9). Electronically Signed   By: Valetta Mole M.D.   On: 07/21/2021 15:03   DG Chest Portable 1 View  Result Date: 07/21/2021 CLINICAL DATA:  Hypoglycemia, altered mental status EXAM: PORTABLE CHEST 1 VIEW COMPARISON:  07/05/2021 chest radiograph. FINDINGS:  Surgical clips overlie the left upper quadrant of the abdomen. Fiducial markers overlie the bilateral lung apices. Stable cardiomediastinal silhouette with normal  heart size. No pneumothorax. No pleural effusion. 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 No pulmonary edema. No acute consolidative airspace disease. Chronic mild curvilinear bibasilar scarring. IMPRESSION: 1. No acute cardiopulmonary disease. 2. 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 3. Chronic mild curvilinear bibasilar scarring. Electronically Signed   By: Ilona Sorrel M.D.   On: 07/21/2021 13:57    EKG: Independently reviewed.  Sinus rhythm 85 bpm  Assessment/Plan Acute renal failure superimposed on chronic kidney disease stage IIIb metabolic acidosis with elevated anion gap: Patient resents with creatinine elevated up to 5.5 with BUN 54, CO2 12, and anion gap 18.  Creatinine previously had been 2.07 on 10/12 and 1.32 on 9/20.  Patient has been bolused 1 L of IV fluids.  Metabolic acidosis is likely secondary to patient's renal failure. -Admit to a progressive bed -Strict I&Os -Hold nephrotoxic agents -Add on CK given reports of falls -Bolus 1 L normal saline IV fluids and then placed on a rate of 100 mL/h -Sodium bicarb 150 mEq in sterile water at 50 mL/h -Daily monitoring of kidney function  History of UTI: Prior to admission.  Patient had recently been being treated for UTI.  Urine cultures from 10/12 significant for Enterococcus faecalis greater than equal to 100,000 colonies/ml.  She had completed a 10-day course of amoxicillin which should have been adequate coverage.  Patient has been initially ordered Rocephin. -Follow-up urinalysis once able to be obtained -Discontinued Rocephin and switched to Unasyn as it has adequate coverage against Enterococcus  SIRS lactic acidosis leukocytosis: On admission patient was noted to have a mild tachycardia with WBC 13.1  and initial lactic acid 5.7.  Daughter notes that patient was still eating expired Jif peanut butter which had been recalled for concern for Salmonella. -Follow-up blood and urine culture -Empiric antibiotics of Unasyn which has adequate coverage against Enterococcus and Salmonella -Adjust antibiotics as needed  Acute metabolic encephalopathy: Patient was noted to be unresponsive this morning, but suspect this is secondary to patient's hypoglycemia.  Over the last few months patient has not been her normal self.  Daughter notes increased confusion when patient had previously been alert and oriented x3.  Has been evaluated by neurology and had MRI in September which noted brain atrophy with chronic microvascular changes without any acute signs of a stroke. -Neuro check  Diabetes mellitus type 2 with hypoglycemia: Acute.  Patient was noted to have blood sugars low as 50 on EMS arrival given amp of 25 g of D10.  Last hemoglobin A1c was 7.2 on 01/2021.  Medication regimen includes glipizide 20 mg in the morning/10 mg nightly, Tresiba 15 units nightly as needed for elevated blood sugars, and metformin 1000 mg twice daily with meals.  Suspect glipizide as a likely cause of patient's hypoglycemia in the setting of her not eating and drinking as she was not given the [place] last night. -Hypoglycemic protocols -Check hemoglobin A1c in a.m. -Hold metformin, glipizide, and Tresiba. -CBGs before every meal with very sensitive SSI  Hyperkalemia: Acute.  Potassium elevated at 5.8 on admission.  No significant ST wave changes noted. -Lokelma 10  g p.o. x1 dose -Continue IV fluids -Continue to monitor potassium level  Possible thrush: Patient was noted to have some white plaques of the upper palate concerning for thrush.  I warrant further investigation of the cause -Nystatin swish and swallow  Normocytic anemia: Hemoglobin 10.6 g/dL which appears to be lower than previous baseline which has  been around 11.   Patient did not report any signs of bleeding. -Continue to monitor H&H  Essential hypertension: Home blood pressure medications include amlodipine 5 mg daily and ramipril 10 mg daily. -Continue amlodipine -Hold ramipril due to acute renal failure  Hyponatremia: Acute.  On admission sodium 124.  Suspect secondary to hypovolemic hyponatremia given patient's poor p.o. intake and acute kidney injury -IV fluids as seen above  Generalized weakness frequent falls -Will likely need PT/OT to eval and treat prior to discharge  Anxiety: Patient was noted to be anxious and picking at her EKG leads. -Ativan IV as needed for anxiety  Cavitary lesions of the lung: Patient being followed in the outpatient setting by pulmonology. -Continue outpatient follow-up with pulmonology sounds too much like her  GERD -Continue Pepcid  DVT prophylaxis: SCDs Code Status: Full Family Communication: Family updated at bedside Disposition Plan: To be determined Consults called: none Admission status: Patient require more than 2 midnight stay  Norval Morton MD [facility]   If 7PM-7AM, please contact night-coverage   07/21/2021, 3:17 PM

## 2021-07-21 NOTE — ED Notes (Signed)
ED TO INPATIENT HANDOFF REPORT  ED Nurse Name and Phone #:   S Name/Age/Gender Beth [name] 80 y.o. female Room/Bed: 032C/032C  Code Status   Code Status: Full Code  Home/SNF/Other Home Patient oriented to: self Is this baseline? No   Triage Complete: Triage complete  Chief Complaint Sepsis [facility]) [A41.9]  Triage Note Pt BIB ems from home; called put for unresponsiveness; cbg 50 on ems arrival, 25 grams D10 given, last check 250; Hx DM; daughter endorses AMS x 1 week; recently treated for UTI; daughter endorses recent falls, bruising to back of head form fall on Tuesday; pt not on thinners; alert to self only at present   Allergies No Known Allergies  Level of Care/Admitting Diagnosis ED Disposition     ED Disposition  [place]: [place] [[id number]]  Level of Care: Progressive [102]  Admit to Progressive based on following criteria: CARDIOVASCULAR & THORACIC of moderate stability with acute coronary syndrome symptoms/low risk myocardial infarction/hypertensive urgency/arrhythmias/heart failure potentially compromising stability and stable post cardiovascular intervention patients.  Admit to Progressive based on following criteria: MULTISYSTEM THREATS such as stable sepsis, metabolic/electrolyte imbalance with or without encephalopathy that is responding to early treatment.  May admit patient to [facility] or [facility] if equivalent level of care is available:: No  Covid Evaluation: Confirmed COVID Negative  Diagnosis: Sepsis [facility]) [[id number]]  Admitting Physician: Norval Morton [[id number]]  Attending Physician: Norval Morton [[id number]]  Estimated length of stay: past midnight tomorrow  Certification:: I certify this patient will need inpatient services for at least 2 midnights          B Medical/Surgery History Past Medical History:  Diagnosis Date   Arthritis    neck and all over   Cancer ([place])  1998   hx colon ca-chemo radiation; lung; denies lung involvement    Chronic back pain    Diabetes mellitus without complication ([place])    type 2   History of Raynaud's syndrome    Hyperlipemia    Hypertension    Psoriasis    afflicts bed of fingernails    Sarcoidosis    of the eye; 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 ; reports hasnt had any affliction in years    Wears glasses    Wears partial dentures    top and bottom partials   Past Surgical History:  Procedure Laterality Date   ABDOMINAL HYSTERECTOMY     APPENDECTOMY     BRONCHIAL BIOPSY  06/27/2021   Procedure: BRONCHIAL BIOPSIES;  Surgeon: Garner Nash, DO;  Location: [place] [facility];  Service: Pulmonary;;   BRONCHIAL BRUSHINGS  06/27/2021   Procedure: BRONCHIAL BRUSHINGS;  Surgeon: Garner Nash, DO;  Location: [place] [facility];  Service: Pulmonary;;   BRONCHIAL WASHINGS  06/27/2021   Procedure: BRONCHIAL WASHINGS;  Surgeon: Garner Nash, DO;  Location: [place];  Service: Pulmonary;;   CATARACT EXTRACTION, BILATERAL     COLONOSCOPY     many   COMBINED MEDIASTINOSCOPY AND BRONCHOSCOPY  2002   biopsy   DG ARTHRO THUMB*R*  2011   ablasion rt thumb nailbed   FIDUCIAL MARKER PLACEMENT  06/27/2021   Procedure: FIDUCIAL MARKER PLACEMENT;  Surgeon: Garner Nash, DO;  Location: [place];  Service: Pulmonary;;   FORAMINOTOMY 1 LEVEL Bilateral 05/09/2018   Procedure: Foraminotomy L4-L5 Root;  Surgeon: [name], MD;  Location[facility];  Service: Orthopedics;  Laterality: Bilateral;  I & D EXTREMITY Right 07/23/2013   Procedure: IRRIGATION AND DEBRIDEMENT DIP JOINT RIGHT INDEX FINGER, EXCISE MUCOID CYST RIGHT INDEX FINGER  ;  Surgeon: Cammie Sickle., MD;  Location: [place];  Service: Orthopedics;  Laterality: Right;   LIPOMA EXCISION     LUMBAR LAMINECTOMY/DECOMPRESSION MICRODISCECTOMY N/A 05/09/2018   Procedure: Complete decompressive lumbar laminectomy L4-L5;  Surgeon: [name], MD;   Location: [facility];  Service: Orthopedics;  Laterality: N/A;  156min   MASS EXCISION Right 09/28/2014   Procedure: EXCISION MASS RIGHT MIDDLE FINGER;  Surgeon: Daryll Brod, MD;  Location: [place];  Service: Orthopedics;  Laterality: Right;  ANESTHESIA:  GENERAL, IV REGIONAL FAB   MASS EXCISION Bilateral 12/24/2017   Procedure: EXCISION MASS RIGHT SMALL, LEFT INDEX, excision left nail horn middle finger;  Surgeon: Daryll Brod, MD;  Location: [place];  Service: Orthopedics;  Laterality: Bilateral;  FAB   NAILBED REPAIR Left 08/28/2016   Procedure: Ablation nail matrix left index finger andl left middle finger;  Surgeon: Daryll Brod, MD;  Location: [place];  Service: Orthopedics;  Laterality: Left;   PARTIAL COLECTOMY  1998   cancer-rad/chemo   SKIN FULL THICKNESS GRAFT Right 07/23/2013   Procedure: NAIL PLATE ABLATION WITH FULL THICKNESS SKIN GRAFT FROM RIGHT   MEDIAL ELBOW TO RIGHT LONG FINGER NAIL BED;  Surgeon: Cammie Sickle., MD;  Location: [place];  Service: Orthopedics;  Laterality: Right;   SKIN FULL THICKNESS GRAFT Left 08/28/2016   Procedure: SKIN GRAFT FULL THICKNESS upper left arm;  Surgeon: Daryll Brod, MD;  Location: [place];  Service: Orthopedics;  Laterality: Left;   VIDEO BRONCHOSCOPY WITH ENDOBRONCHIAL NAVIGATION Bilateral 06/27/2021   Procedure: VIDEO BRONCHOSCOPY WITH ENDOBRONCHIAL NAVIGATION;  Surgeon: Garner Nash, DO;  Location: [place];  Service: Pulmonary;  Laterality: Bilateral;  ION, possible fiducial placement   VIDEO BRONCHOSCOPY WITH RADIAL ENDOBRONCHIAL ULTRASOUND  06/27/2021   Procedure: RADIAL ENDOBRONCHIAL ULTRASOUND;  Surgeon: Garner Nash, DO;  Location: [place] [facility];  Service: Pulmonary;;     A IV Location/Drains/Wounds Patient Lines/Drains/Airways Status     Active Line/Drains/Airways     Name Placement date Placement time Site Days   Peripheral IV 07/05/21 20 G  Left Antecubital 07/05/21  0647  Antecubital  16   Peripheral IV 07/21/21 22 G Anterior;Right Hand 07/21/21  1253  Hand  less than 1            Intake/Output Last 24 hours No intake or output data in the 24 hours ending 07/21/21 1616  Labs/Imaging Results for orders placed or performed during the hospital encounter of 07/21/21 (from the past 48 hour(s))  CBC with Differential     Status: Abnormal   Collection Time: 07/21/21  1:30 PM  Result Value Ref Range   WBC 13.1 (H) 4.0 - 10.5 K/uL   RBC 3.47 (L) 3.87 - 5.11 MIL/uL   Hemoglobin 10.6 (L) 12.0 - 15.0 g/dL   HCT 32.0 (L) 36.0 - 46.0 %   MCV 92.2 80.0 - 100.0 fL   MCH 30.5 26.0 - 34.0 pg   MCHC 33.1 30.0 - 36.0 g/dL   RDW 13.2 11.5 - 15.5 %   Platelets 287 150 - 400 K/uL   nRBC 0.0 0.0 - 0.2 %   Neutrophils Relative % 76 %   Neutro Abs 10.1 (H) 1.7 - 7.7 K/uL   Lymphocytes Relative 17 %   Lymphs Abs  2.2 0.7 - 4.0 K/uL   Monocytes Relative 4 %   Monocytes Absolute 0.5 0.1 - 1.0 K/uL   Eosinophils Relative 1 %   Eosinophils Absolute 0.1 0.0 - 0.5 K/uL   Basophils Relative 1 %   Basophils Absolute 0.1 0.0 - 0.1 K/uL   Immature Granulocytes 1 %   Abs Immature Granulocytes 0.07 0.00 - 0.07 K/uL    Comment: Performed at [facility] [address] [place], [place] [address]  Comprehensive metabolic panel     Status: Abnormal   Collection Time: 07/21/21  1:30 PM  Result Value Ref Range   Sodium 124 (L) 135 - 145 mmol/L   Potassium 5.8 (H) 3.5 - 5.1 mmol/L   Chloride 94 (L) 98 - 111 mmol/L   CO2 12 (L) 22 - 32 mmol/L   Glucose, Bld 156 (H) 70 - 99 mg/dL    Comment: Glucose reference range applies only to samples taken after fasting for at least 8 hours.   BUN 54 (H) 8 - 23 mg/dL   Creatinine, Ser 5.50 (H) 0.44 - 1.00 mg/dL   Calcium 8.8 (L) 8.9 - 10.3 mg/dL   Total Protein 6.5 6.5 - 8.1 g/dL   Albumin 3.3 (L) 3.5 - 5.0 g/dL   AST 27 15 - 41 U/L   ALT 19 0 - 44 U/L   Alkaline Phosphatase 88 38 - 126 U/L    Total Bilirubin 0.8 0.3 - 1.2 mg/dL   GFR, Estimated 7 (L) >60 mL/min    Comment: (NOTE) Calculated using the CKD-EPI Creatinine Equation (2021)    Anion gap 18 (H) 5 - 15    Comment: Performed at [facility], [place] [address], [place], [place] [address]  Lipase, blood     Status: None   Collection Time: 07/21/21  1:30 PM  Result Value Ref Range   Lipase 25 11 - 51 U/L    Comment: Performed at [facility] [address], [place], [place] [address]  Lactic acid, plasma     Status: Abnormal   Collection Time: 07/21/21  1:30 PM  Result Value Ref Range   Lactic Acid, Venous 5.7 (HH) 0.5 - 1.9 mmol/L    Comment: CRITICAL RESULT CALLED TO, READ BACK BY AND VERIFIED WITHRoselle Locus RN [place]er BY R VERAAR Performed at [facility], [address] [place], [place] [address]   TSH     Status: None   Collection Time: 07/21/21  1:30 PM  Result Value Ref Range   TSH 2.983 0.350 - 4.500 uIU/mL    Comment: Performed by a 3rd Generation assay with a functional sensitivity of <=0.01 uIU/mL. Performed at [facility], [place] [address], [place], [place] [address]   Protime-INR     Status: None   Collection Time: 07/21/21  1:30 PM  Result Value Ref Range   Prothrombin Time 13.1 11.4 - 15.2 seconds   INR 1.0 0.8 - 1.2    Comment: (NOTE) INR goal varies based on device and disease states. Performed at [facility], [place] [address] [place], [place] [address]   Ammonia     Status: None   Collection Time: 07/21/21  1:30 PM  Result Value Ref Range   Ammonia 12 9 - 35 umol/L    Comment: Performed at [facility], [place] [address], [place],  [address]  POC CBG, ED     Status: Abnormal   Collection  Time: 07/21/21  1:48 PM  Result Value Ref Range   Glucose-Capillary 136 (H) 70 - 99 mg/dL    Comment: Glucose reference range applies only to samples taken after fasting for at least 8 hours.  Resp Panel by RT-PCR (Flu A&B, Covid) Nasopharyngeal Swab      Status: None   Collection Time: 07/21/21  2:02 PM   Specimen: Nasopharyngeal Swab; Nasopharyngeal(NP) swabs in vial transport medium  Result Value Ref Range   SARS Coronavirus 2 by RT PCR NEGATIVE NEGATIVE    Comment: (NOTE) SARS-CoV-2 target nucleic acids are NOT DETECTED.  The SARS-CoV-2 RNA is generally detectable in upper respiratory specimens during the acute phase of infection. The lowest concentration of SARS-CoV-2 viral copies this assay can detect is 138 copies/mL. A negative result does not preclude SARS-Cov-2 infection and should not be used as the sole basis for treatment or other patient management decisions. A negative result may occur with  improper specimen collection/handling, submission of specimen other than nasopharyngeal swab, presence of viral mutation(s) within the areas targeted by this assay, and inadequate number of viral copies(<138 copies/mL). A negative result must be combined with clinical observations, patient history, and epidemiological information. The expected result is Negative.  Fact Sheet for Patients:  EntrepreneurPulse.com.au  Fact Sheet for Healthcare Providers:  IncredibleEmployment.be  This test is no t yet approved or cleared by the [place] FDA and  has been authorized for detection and/or diagnosis of SARS-CoV-2 by FDA under an Emergency Use Authorization (EUA). This EUA will remain  in effect (meaning this test can be used) for the duration of the COVID-19 declaration under Section 564(b)(1) of the Act, 21 U.S.C.section 360bbb-3(b)(1), unless the authorization is terminated  or revoked sooner.       Influenza A by PCR NEGATIVE NEGATIVE   Influenza B by PCR NEGATIVE NEGATIVE    Comment: (NOTE) The Xpert Xpress SARS-CoV-2/FLU/RSV plus assay is intended as an aid in the diagnosis of influenza from Nasopharyngeal swab specimens and should not be used as a sole basis for treatment. Nasal  washings and aspirates are unacceptable for Xpert Xpress SARS-CoV-2/FLU/RSV testing.  Fact Sheet for Patients: EntrepreneurPulse.com.au  Fact Sheet for Healthcare Providers: IncredibleEmployment.be  This test is not yet approved or cleared by the [place] FDA and has been authorized for detection and/or diagnosis of SARS-CoV-2 by FDA under an Emergency Use Authorization (EUA). This EUA will remain in effect (meaning this test can be used) for the duration of the COVID-19 declaration under Section 564(b)(1) of the Act, 21 U.S.C. section 360bbb-3(b)(1), unless the authorization is terminated or revoked.  Performed at [facility], [place] [address], [place], [place] [address]    CT HEAD WO CONTRAST (5MM)  Result Date: 07/21/2021 CLINICAL DATA:  Altered mental status, multiple falls EXAM: CT HEAD WITHOUT CONTRAST TECHNIQUE: Contiguous axial images were obtained from the base of the skull through the vertex without intravenous contrast. COMPARISON:  Brain MRI 05/28/2021, CT head 07/05/2021 FINDINGS: Brain: There is no evidence of acute intracranial hemorrhage, extra-axial fluid collection, or acute infarct. There is unchanged mild parenchymal volume loss with commensurate enlargement of the ventricular system. Hypodensity in the subcortical and periventricular white matter is unchanged, likely reflecting sequela of chronic white matter microangiopathy. There is no mass lesion.  There is no midline shift. Vascular: There is calcification of the bilateral cavernous ICAs. Skull: Normal. Negative for fracture or focal lesion. Sinuses/Orbits: Soap bubble appearing secretions are again seen in the right sphenoid sinus.  Bilateral lens implants are in place. The globes and orbits are otherwise unremarkable. Other: None. IMPRESSION: 1. No acute intracranial pathology. 2. Unchanged parenchymal volume loss and chronic white matter microangiopathy. Cerebral Atrophy  (ICD10-G31.9). Electronically Signed   By: Valetta Mole M.D.   On: 07/21/2021 15:03   DG Chest Portable 1 View  Result Date: 07/21/2021 CLINICAL DATA:  Hypoglycemia, altered mental status EXAM: PORTABLE CHEST 1 VIEW COMPARISON:  07/05/2021 chest radiograph. FINDINGS: Surgical clips overlie the left upper quadrant of the abdomen. Fiducial markers overlie the bilateral lung apices. Stable cardiomediastinal silhouette with normal heart size. No pneumothorax. No pleural effusion. 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 No pulmonary edema. No acute consolidative airspace disease. Chronic mild curvilinear bibasilar scarring. IMPRESSION: 1. No acute cardiopulmonary disease. 2. 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 3. Chronic mild curvilinear bibasilar scarring. Electronically Signed   By: Ilona Sorrel M.D.   On: 07/21/2021 13:57    Pending Labs Unresulted Labs (From admission, onward)     Start     Ordered   07/22/21 0500  Comprehensive metabolic panel  Tomorrow morning,   R        07/21/21 1600   07/22/21 0500  CBC  Tomorrow morning,   R        07/21/21 1600   07/21/21 1601  Urinalysis, Routine w reflex microscopic  ONCE - STAT,   STAT        07/21/21 1600   07/21/21 1453  Blood culture (routine x 2)  BLOOD CULTURE X 2,   STAT      07/21/21 1453   07/21/21 1317  Urinalysis, Routine w reflex microscopic  Once,   STAT        07/21/21 1318   07/21/21 1317  Urine Culture  Once,   STAT       Question:  Indication  Answer:  Altered mental status (if no other cause identified)   07/21/21 1318   07/21/21 1317  Lactic acid, plasma  Now then every 2 hours,   STAT      07/21/21 1318            Vitals/Pain Today's Vitals   07/21/21 1257 07/21/21 1258 07/21/21 1302 07/21/21 1345  BP: (!) 176/64   (!) 169/57  Pulse: 86   81  Resp: 19   (!) 21  Temp: 98.2 F (36.8 C)     TempSrc: Oral     SpO2: 95%   100%  Weight:  67.2 kg     Height:  5\' 2"  (1.575 m)    PainSc:   0-No pain     Isolation Precautions No active isolations  Medications Medications  cefTRIAXone (ROCEPHIN) 1 g in sodium chloride 0.9 % 100 mL IVPB (has no administration in time range)  sodium chloride 0.9 % bolus 1,000 mL (has no administration in time range)  amLODipine (NORVASC) tablet 5 mg (has no administration in time range)  sodium zirconium cyclosilicate (LOKELMA) packet 10 g (has no administration in time range)  sodium chloride flush (NS) 0.9 % injection 3 mL (has no administration in time range)  0.9 %  sodium chloride infusion (has no administration in time range)  acetaminophen (TYLENOL) tablet 650 mg (has no administration in time range)    Or  acetaminophen (TYLENOL) suppository 650 mg (has no administration in time range)  albuterol (PROVENTIL) (2.5 MG/3ML) 0.083% nebulizer solution 2.5 mg (has no administration in time range)  ondansetron (ZOFRAN) tablet 4 mg (has no administration in time range)    Or  ondansetron (ZOFRAN) injection 4 mg (has no administration in time range)  sodium chloride 0.9 % bolus 1,000 mL (has no administration in time range)    Mobility walks with device High fall risk   Focused Assessments    R Recommendations: See Admitting Provider Note  Report given to:   Additional Notes:

## 2021-07-21 NOTE — Progress Notes (Signed)
Pharmacy Antibiotic Note  Beth [name] is a 80 y.o. female admitted on 07/21/2021 with UTI 2/2 E Faecalis (was on Augmentin PO x 5d PTA + Salmonella coverage from eating recalled JIF).  Pharmacy has been consulted for Unasyn dosing.  Plan: Unasyn 3g IV q24h -Monitor renal function, clinical status, and antibiotic plan  Height: 5\' 2"  (157.5 cm) Weight: 67.2 kg (148 lb 2.4 oz) IBW/kg (Calculated) : 50.1  Temp (24hrs), Avg:98.2 F (36.8 C), Min:98.2 F (36.8 C), Max:98.2 F (36.8 C)  Recent Labs  Lab 07/21/21 1330  WBC 13.1*  CREATININE 5.50*  LATICACIDVEN 5.7*    Estimated Creatinine Clearance: 7.5 mL/min (A) (by C-G formula based on SCr of 5.5 mg/dL (H)).    No Known Allergies  Antimicrobials this admission: Unasyn 10/2/8 >>   Thank you for allowing pharmacy to be a part of this patient's care.  Joetta Manners, PharmD, [facility] Emergency Medicine Clinical Pharmacist ED RPh Phone: [place]: [phone]

## 2021-07-22 ENCOUNTER — Inpatient Hospital Stay (HOSPITAL_COMMUNITY): Payer: Medicare Other

## 2021-07-22 DIAGNOSIS — N179 Acute kidney failure, unspecified: Secondary | ICD-10-CM

## 2021-07-22 DIAGNOSIS — Z794 Long term (current) use of insulin: Secondary | ICD-10-CM

## 2021-07-22 DIAGNOSIS — E872 Acidosis, unspecified: Secondary | ICD-10-CM | POA: Diagnosis not present

## 2021-07-22 DIAGNOSIS — G9341 Metabolic encephalopathy: Secondary | ICD-10-CM | POA: Diagnosis not present

## 2021-07-22 DIAGNOSIS — N1831 Chronic kidney disease, stage 3a: Secondary | ICD-10-CM | POA: Diagnosis not present

## 2021-07-22 DIAGNOSIS — D649 Anemia, unspecified: Secondary | ICD-10-CM

## 2021-07-22 DIAGNOSIS — W19XXXA Unspecified fall, initial encounter: Secondary | ICD-10-CM

## 2021-07-22 DIAGNOSIS — R6510 Systemic inflammatory response syndrome (SIRS) of non-infectious origin without acute organ dysfunction: Secondary | ICD-10-CM

## 2021-07-22 DIAGNOSIS — E11649 Type 2 diabetes mellitus with hypoglycemia without coma: Secondary | ICD-10-CM

## 2021-07-22 DIAGNOSIS — N19 Unspecified kidney failure: Secondary | ICD-10-CM

## 2021-07-22 LAB — POTASSIUM
Potassium: 6.5 mmol/L (ref 3.5–5.1)
Potassium: 6.4 mmol/L (ref 3.5–5.1)
Potassium: 4.2 mmol/L (ref 3.5–5.1)

## 2021-07-22 LAB — CBC
HCT: 30.5 % — ABNORMAL LOW (ref 36.0–46.0)
Hemoglobin: 10.2 g/dL — ABNORMAL LOW (ref 12.0–15.0)
MCH: 30.7 pg (ref 26.0–34.0)
MCHC: 33.4 g/dL (ref 30.0–36.0)
MCV: 91.9 fL (ref 80.0–100.0)
Platelets: 287 10*3/uL (ref 150–400)
RBC: 3.32 MIL/uL — ABNORMAL LOW (ref 3.87–5.11)
RDW: 13.5 % (ref 11.5–15.5)
WBC: 13.4 10*3/uL — ABNORMAL HIGH (ref 4.0–10.5)
nRBC: 0 % (ref 0.0–0.2)

## 2021-07-22 LAB — RENAL FUNCTION PANEL
Albumin: 2.4 g/dL — ABNORMAL LOW (ref 3.5–5.0)
Anion gap: 23 — ABNORMAL HIGH (ref 5–15)
BUN: 51 mg/dL — ABNORMAL HIGH (ref 8–23)
CO2: 14 mmol/L — ABNORMAL LOW (ref 22–32)
Calcium: 7 mg/dL — ABNORMAL LOW (ref 8.9–10.3)
Chloride: 96 mmol/L — ABNORMAL LOW (ref 98–111)
Creatinine, Ser: 5.28 mg/dL — ABNORMAL HIGH (ref 0.44–1.00)
GFR, Estimated: 8 mL/min — ABNORMAL LOW (ref >60)
Glucose, Bld: 314 mg/dL — ABNORMAL HIGH (ref 70–99)
Phosphorus: 5.9 mg/dL — ABNORMAL HIGH (ref 2.5–4.6)
Potassium: 4.9 mmol/L (ref 3.5–5.1)
Sodium: 133 mmol/L — ABNORMAL LOW (ref 135–145)

## 2021-07-22 LAB — COMPREHENSIVE METABOLIC PANEL
ALT: 18 U/L (ref 0–44)
AST: 25 U/L (ref 15–41)
Albumin: 3 g/dL — ABNORMAL LOW (ref 3.5–5.0)
Alkaline Phosphatase: 85 U/L (ref 38–126)
Anion gap: 18 — ABNORMAL HIGH (ref 5–15)
BUN: 59 mg/dL — ABNORMAL HIGH (ref 8–23)
CO2: 11 mmol/L — ABNORMAL LOW (ref 22–32)
Calcium: 8.4 mg/dL — ABNORMAL LOW (ref 8.9–10.3)
Chloride: 102 mmol/L (ref 98–111)
Creatinine, Ser: 5.96 mg/dL — ABNORMAL HIGH (ref 0.44–1.00)
GFR, Estimated: 7 mL/min — ABNORMAL LOW (ref >60)
Glucose, Bld: 43 mg/dL — CL (ref 70–99)
Potassium: 6.4 mmol/L (ref 3.5–5.1)
Sodium: 131 mmol/L — ABNORMAL LOW (ref 135–145)
Total Bilirubin: 0.6 mg/dL (ref 0.3–1.2)
Total Protein: 5.9 g/dL — ABNORMAL LOW (ref 6.5–8.1)

## 2021-07-22 LAB — GLUCOSE, CAPILLARY
Glucose-Capillary: 167 mg/dL — ABNORMAL HIGH (ref 70–99)
Glucose-Capillary: 21 mg/dL — CL (ref 70–99)
Glucose-Capillary: 142 mg/dL — ABNORMAL HIGH (ref 70–99)
Glucose-Capillary: 323 mg/dL — ABNORMAL HIGH (ref 70–99)
Glucose-Capillary: 224 mg/dL — ABNORMAL HIGH (ref 70–99)
Glucose-Capillary: 380 mg/dL — ABNORMAL HIGH (ref 70–99)
Glucose-Capillary: 274 mg/dL — ABNORMAL HIGH (ref 70–99)
Glucose-Capillary: 120 mg/dL — ABNORMAL HIGH (ref 70–99)

## 2021-07-22 LAB — PROCALCITONIN: Procalcitonin: 0.38 ng/mL

## 2021-07-22 LAB — LACTIC ACID, PLASMA
Lactic Acid, Venous: 5.2 mmol/L (ref 0.5–1.9)
Lactic Acid, Venous: >9 mmol/L (ref 0.5–1.9)
Lactic Acid, Venous: >9 mmol/L (ref 0.5–1.9)
Lactic Acid, Venous: >9 mmol/L (ref 0.5–1.9)

## 2021-07-22 LAB — BASIC METABOLIC PANEL
Anion gap: 24 — ABNORMAL HIGH (ref 5–15)
BUN: 60 mg/dL — ABNORMAL HIGH (ref 8–23)
CO2: 7 mmol/L — ABNORMAL LOW (ref 22–32)
Calcium: 8.1 mg/dL — ABNORMAL LOW (ref 8.9–10.3)
Chloride: 98 mmol/L (ref 98–111)
Creatinine, Ser: 6.47 mg/dL — ABNORMAL HIGH (ref 0.44–1.00)
GFR, Estimated: 6 mL/min — ABNORMAL LOW (ref >60)
Glucose, Bld: 204 mg/dL — ABNORMAL HIGH (ref 70–99)
Potassium: 5.8 mmol/L — ABNORMAL HIGH (ref 3.5–5.1)
Sodium: 129 mmol/L — ABNORMAL LOW (ref 135–145)

## 2021-07-22 LAB — MRSA NEXT GEN BY PCR, NASAL: MRSA by PCR Next Gen: NOT DETECTED

## 2021-07-22 LAB — CREATININE, URINE, RANDOM: Creatinine, Urine: 61.04 mg/dL

## 2021-07-22 LAB — SODIUM, URINE, RANDOM: Sodium, Ur: 48 mmol/L

## 2021-07-22 MED ORDER — CHLORHEXIDINE GLUCONATE CLOTH 2 % EX PADS
6.0000 | MEDICATED_PAD | Freq: Every day | CUTANEOUS | Status: DC
  Administered 2021-07-22 – 2021-07-25 (×3): 6 via TOPICAL

## 2021-07-22 MED ORDER — HEPARIN (PORCINE) 2000 UNITS/L FOR CRRT: INTRAVENOUS_CENTRAL | Status: DC | PRN

## 2021-07-22 MED ORDER — DEXTROSE 50 % IV SOLN
INTRAVENOUS | Status: AC
  Administered 2021-07-22: 50 mL via INTRAVENOUS
  Filled 2021-07-22: qty 50

## 2021-07-22 MED ORDER — SODIUM BICARBONATE 8.4 % IV SOLN
INTRAVENOUS | Status: DC
  Filled 2021-07-22: qty 1000
  Filled 2021-07-22: qty 150
  Filled 2021-07-22 (×3): qty 1000

## 2021-07-22 MED ORDER — PRISMASOL BGK 0/2.5 32-2.5 MEQ/L EC SOLN
Status: DC
  Filled 2021-07-22 (×5): qty 5000

## 2021-07-22 MED ORDER — PRISMASOL BGK 0/2.5 32-2.5 MEQ/L EC SOLN
Status: DC
  Filled 2021-07-22 (×12): qty 5000

## 2021-07-22 MED ORDER — NOREPINEPHRINE 4 MG/250ML-% IV SOLN
INTRAVENOUS | Status: AC
  Administered 2021-07-22: 2 ug/min via INTRAVENOUS
  Filled 2021-07-22: qty 250

## 2021-07-22 MED ORDER — SODIUM CHLORIDE 0.9 % IV SOLN
350.0000 [IU]/h | INTRAVENOUS | Status: DC
  Administered 2021-07-22 – 2021-07-23 (×2): 350 [IU]/h via INTRAVENOUS_CENTRAL
  Filled 2021-07-22 (×2): qty 2

## 2021-07-22 MED ORDER — NOREPINEPHRINE 4 MG/250ML-% IV SOLN
0.0000 ug/min | INTRAVENOUS | Status: DC
  Filled 2021-07-22: qty 250

## 2021-07-22 MED ORDER — HEPARIN SODIUM (PORCINE) 1000 UNIT/ML DIALYSIS
1000.0000 [IU] | INTRAMUSCULAR | Status: DC | PRN
  Administered 2021-07-22: 2400 [IU] via INTRAVENOUS_CENTRAL
  Filled 2021-07-22 (×3): qty 6

## 2021-07-22 MED ORDER — ALBUTEROL SULFATE (2.5 MG/3ML) 0.083% IN NEBU
10.0000 mg | INHALATION_SOLUTION | Freq: Once | RESPIRATORY_TRACT | Status: AC
  Administered 2021-07-22: 10 mg via RESPIRATORY_TRACT
  Filled 2021-07-22: qty 12

## 2021-07-22 MED ORDER — STERILE WATER FOR INJECTION IV SOLN
INTRAVENOUS | Status: DC
  Filled 2021-07-22 (×7): qty 150

## 2021-07-22 NOTE — Progress Notes (Addendum)
BP dropped after beginning CRRT. Goal to keep even. RN unable to keep patient even d/t BP. Paged NP, awaiting return call.  Made MD aware of inability to run pt even and low BP. Order received for levophed.    RN informed MD that mental status remained unchanged. MD stated he would re-evaluate patient later this afternoon.

## 2021-07-22 NOTE — Progress Notes (Signed)
Notified MD with critical values BS-45/K+-6.4. Orders given and carried out. Nephrology notified as well with orders given and carried out. Will continue to monitor closely. See PCR for vitals and interventions

## 2021-07-22 NOTE — Progress Notes (Signed)
Pt transferred to [place], VSS, family at bedside.   Chrisandra Carota, RN 07/22/2021

## 2021-07-22 NOTE — Progress Notes (Signed)
Patient seen examined this am  Discussion with family--she had been doing fair till this last week, but has had an acute decline Her acidosis is not explained by just AKI  D/w Dr. Blima Ledger need [facility].  Given her worsening resp status, may need input from CCM and ICU transfer

## 2021-07-22 NOTE — Procedures (Signed)
Central Venous Catheter Insertion Procedure Note  Beth [name]  [id number]  07/09/1941  Date:07/22/21  Time:12:11 PM   Provider Performing:Tameshia Bonneville D. Harris   Procedure: Insertion of Non-tunneled Central Venous Catheter(36556)with US guidance (36644)      Indication(s) Hemodialysis  Consent Risks of the procedure as well as the alternatives and risks of each were explained to the patient and/or caregiver.  Consent for the procedure was obtained and is signed in the bedside chart  Anesthesia Topical only with 1% lidocaine   Timeout Verified patient identification, verified procedure, site/side was marked, verified correct patient position, special equipment/implants available, medications/allergies/relevant history reviewed, required imaging and test results available.  Sterile Technique Maximal sterile technique including full sterile barrier drape, hand hygiene, sterile gown, sterile gloves, mask, hair covering, sterile ultrasound probe cover (if used).  Procedure Description Area of catheter insertion was cleaned with chlorhexidine and draped in sterile fashion.   With real-time ultrasound guidance a HD catheter was placed into the right internal jugular vein.  Nonpulsatile blood flow and easy flushing noted in all ports.  The catheter was sutured in place and sterile dressing applied.  Complications/Tolerance None; patient tolerated the procedure well. Chest X-ray is ordered to verify placement for internal jugular or subclavian cannulation.  Chest x-ray is not ordered for femoral cannulation.  EBL Minimal  Specimen(s) None  Beth [name] D. Kenton Kingfisher, NP-C [facility] Personal contact information can be found on Amion  07/22/2021, 12:12 PM

## 2021-07-22 NOTE — Progress Notes (Signed)
Patient ID: Beth [name], female   DOB: 10-Jun-1941, 80 y.o.   MRN: [id number] S: Beth [name] developed worsening hypoglycemia, hyperkalemia, and acidosis overnight.  Her RR has increased into the 30's and she is more confused and nonverbal.  Family is at bedside. O:BP (!) 170/84 (BP Location: Right Arm)   Pulse (!) 101   Temp 97.8 F (36.6 C) (Axillary)   Resp 17   Ht 5\' 2"  (1.575 m)   Wt 67.2 kg   SpO2 98%   BMI 27.10 kg/m   Intake/Output Summary (Last 24 hours) at 07/22/2021 0949 Last data filed at 07/22/2021 2878 Gross per 24 hour  Intake 2248.25 ml  Output --  Net 2248.25 ml   Intake/Output: I/O last 3 completed shifts: In: 2248.3 [I.V.:1148.3; IV Piggyback:1100] Out: -   Intake/Output this shift:  No intake/output data recorded. Weight change:  MVE:[id number] respiratory distress with RR 35, somnolent, nonverbal GGE:ZMOQH Resp: CTA Abd: hypoactive bowel sounds, soft Ext: no edema  Recent Labs  Lab 07/21/21 1330 07/21/21 2347 07/22/21 0534  NA 124* 131* 129*  K 5.8* 6.4* 5.8*  CL 94* 102 98  CO2 12* 11* 7*  GLUCOSE 156* 43* 204*  BUN 54* 59* 60*  CREATININE 5.50* 5.96* 6.47*  ALBUMIN 3.3* 3.0*  --   CALCIUM 8.8* 8.4* 8.1*  AST 27 25  --   ALT 19 18  --    Liver Function Tests: Recent Labs  Lab 07/21/21 1330 07/21/21 2347  AST 27 25  ALT 19 18  ALKPHOS 88 85  BILITOT 0.8 0.6  PROT 6.5 5.9*  ALBUMIN 3.3* 3.0*   Recent Labs  Lab 07/21/21 1330  LIPASE 25   Recent Labs  Lab 07/21/21 1330  AMMONIA 12   CBC: Recent Labs  Lab 07/21/21 1330 07/21/21 2347  WBC 13.1* 13.4*  NEUTROABS 10.1*  --   HGB 10.6* 10.2*  HCT 32.0* 30.5*  MCV 92.2 91.9  PLT 287 287   Cardiac Enzymes: Recent Labs  Lab 07/21/21 1857  CKTOTAL 82   CBG: Recent Labs  Lab 07/21/21 1348 07/22/21 0133 07/22/21 0153 07/22/21 0422 07/22/21 0946  GLUCAP 136* 21* 167* 142* 323*    Iron Studies: No results for input(s): IRON, TIBC, TRANSFERRIN, FERRITIN in the  last 72 hours. Studies/Results: CT HEAD WO CONTRAST (5MM)  Result Date: 07/21/2021 CLINICAL DATA:  Altered mental status, multiple falls EXAM: CT HEAD WITHOUT CONTRAST TECHNIQUE: Contiguous axial images were obtained from the base of the skull through the vertex without intravenous contrast. COMPARISON:  Brain MRI 05/28/2021, CT head 07/05/2021 FINDINGS: Brain: There is no evidence of acute intracranial hemorrhage, extra-axial fluid collection, or acute infarct. There is unchanged mild parenchymal volume loss with commensurate enlargement of the ventricular system. Hypodensity in the subcortical and periventricular white matter is unchanged, likely reflecting sequela of chronic white matter microangiopathy. There is no mass lesion.  There is no midline shift. Vascular: There is calcification of the bilateral cavernous ICAs. Skull: Normal. Negative for fracture or focal lesion. Sinuses/Orbits: Soap bubble appearing secretions are again seen in the right sphenoid sinus. Bilateral lens implants are in place. The globes and orbits are otherwise unremarkable. Other: None. IMPRESSION: 1. No acute intracranial pathology. 2. Unchanged parenchymal volume loss and chronic white matter microangiopathy. Cerebral Atrophy (ICD10-G31.9). Electronically Signed   By: Valetta Mole M.D.   On: 07/21/2021 15:03   US RENAL  Result Date: 07/21/2021 CLINICAL DATA:  Acute renal insufficiency EXAM: RENAL / URINARY TRACT ULTRASOUND COMPLETE  COMPARISON:  None. FINDINGS: Right Kidney: Renal measurements: 10.3 x 4.5 x 5.5 cm = volume: 132.1 mL. Increased renal cortical echotexture. No hydronephrosis or renal mass. Left Kidney: Renal measurements: 8.0 x 4.5 x 3.9 cm = volume: 73.2 mL. Increased renal cortical echotexture. Simple appearing 1 cm cortical cyst. No hydronephrosis. Bladder: Decompressed with a Foley catheter. Other: None. IMPRESSION: 1. Bilateral increased renal cortical echotexture consistent with medical renal disease. 2.  Simple left renal cyst. Electronically Signed   By: Randa Ngo M.D.   On: 07/21/2021 21:28   DG Chest Portable 1 View  Result Date: 07/21/2021 CLINICAL DATA:  Hypoglycemia, altered mental status EXAM: PORTABLE CHEST 1 VIEW COMPARISON:  07/05/2021 chest radiograph. FINDINGS: Surgical clips overlie the left upper quadrant of the abdomen. Fiducial markers overlie the bilateral lung apices. Stable cardiomediastinal silhouette with normal heart size. No pneumothorax. No pleural effusion. 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 No pulmonary edema. No acute consolidative airspace disease. Chronic mild curvilinear bibasilar scarring. IMPRESSION: 1. No acute cardiopulmonary disease. 2. 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 3. Chronic mild curvilinear bibasilar scarring. Electronically Signed   By: Ilona Sorrel M.D.   On: 07/21/2021 13:57    amLODipine  5 mg Oral Daily   famotidine  20 mg Oral QHS   insulin aspart  0-6 Units Subcutaneous TID WC   nystatin  5 mL Oral QID   sodium chloride flush  3 mL Intravenous Q12H   sodium zirconium cyclosilicate  10 g Oral Once    BMET    Component Value Date/Time   NA 129 (L) 07/22/2021 0534   NA 134 (A) 01/28/2015 0000   K 5.8 (H) 07/22/2021 0534   CL 98 07/22/2021 0534   CO2 7 (L) 07/22/2021 0534   GLUCOSE 204 (H) 07/22/2021 0534   BUN 60 (H) 07/22/2021 0534   BUN 38 (A) 01/28/2015 0000   CREATININE 6.47 (H) 07/22/2021 0534   CALCIUM 8.1 (L) 07/22/2021 0534   GFRNONAA 6 (L) 07/22/2021 0534   GFRAA 47 (L) 05/02/2018 0856   CBC    Component Value Date/Time   WBC 13.4 (H) 07/21/2021 2347   RBC 3.32 (L) 07/21/2021 2347   HGB 10.2 (L) 07/21/2021 2347   HCT 30.5 (L) 07/21/2021 2347   PLT 287 07/21/2021 2347   MCV 91.9 07/21/2021 2347   MCH 30.7 07/21/2021 2347   MCHC 33.4 07/21/2021 2347   RDW 13.5 07/21/2021 2347   LYMPHSABS 2.2 07/21/2021 1330   MONOABS 0.5 07/21/2021 1330    EOSABS 0.1 07/21/2021 1330   BASOSABS 0.1 07/21/2021 1330    Assessment/Plan:  AKI/CKD stage IIIa - in an elderly woman who has had multiple episodes of recurrent UTI with abx, AMS, poor po intake, N/V, and concomitant ACE inhibition.  Likely some ischemic ATN as underlying issue as her Scr was elevated 2 weeks ago with similar presentation, however also on DDx would be pyelonephritis, AIN, possible pulmonary renal syndrome, or acute GN.  Will order renal US to r/o obstruction and FeNa ordered.  Her daughter denies any NSAID or COX-II I use but is on several OTC supplements.  Continue to hold ramipril and metformin Continue with IVF's for now and follow UOP and daily Scr Renal US negative for  obstruction Serologies and complements ordered to r/o acute GN or vasculitis. Given persistent hyperkalemia, worsening metabolic/lactic acidosis, and tachypnea I recommend transfer to ICU and place HD catheter to initiate CRRT (family  is in agreement) Anion gap metabolic acidosis - pt had been taking metformin with elevated lactate level as well as AKI.  Daughter denies any other ingestion as a source.  Stop metformin and agree with starting isotonic bicarb drip.  Daughter also concerned about her mother eating expired [employer] peanut butter which had been recalled for concern of salmonella, however she has not had any diarrhea prior to admission. Worsening lactic acidosis/metabolic acidosis despite IV bicarb. Ordered CT scan of abd and pelvis to r/o perforation or ischemic bowel Transfer to ICU as above Hyperkalemia - due to #1 and #2.  Give lokelma and follow after IV bicarb. 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  Worsening AMS  likely multifactorial delirium due to hyponatremia, hypoglycemia, AKI, and possible recurrent UTI.  Will continue to follow.  Recurrent UTI's with recent course of amoxicillin.  Repeat urine culture and may need to perform CT scan of abdomen and pelvis to r/o stones or abscess. DM type 2 - with hypoglycemia.  Improved after amp of D10 but dropped again and started on D5W with isotonic bicarb at 125 ml/hr with improved readings.  Continue to hold glipizide, metformin, and tresiba for now and follow.  Likely due to poor po intake. HTN - bp elevated.  Hold ramipril and continue with amlodipine 5 mg daily. Cavitary pulmonary lesions - negative workup thus far.  Will send anti-GBM and ANCA. Disposition - pt has had a significant decline in clinical condition and will need transfer to ICU and likely initiation of CVVHD.  Need CT scan of abd given worsening acidosis, hyperkalemia, and renal failure.  Family wishes to be aggressive at this time and are in agreement.   Donetta Potts, MD [employer] [phone]

## 2021-07-22 NOTE — Consult Note (Signed)
NAME:  Beth [name], MRN:  [id number], DOB:  Jun 21, 1941, LOS: 1 ADMISSION DATE:  07/21/2021, CONSULTATION DATE: 10/29 REFERRING MD:  Dr. 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  Upon EMS arrival patient was seen significantly hypoglycemic with a blood sugar of 51.  Per family patient has had persistent medical and cognitive decline since July of this year after being diagnosed with urinary tract infection.  She reports multiple primary care visits and specialty visits to determine cause of decline.  Reports she has been diagnosed with approximately 4 urinary tract infections all treated with antibiotic therapy since July.  Most recently patient completed 10-day course of amoxicillin after cultures resulted with Enterococcus 10/12.  Daughter additionally reports poor oral intake for several weeks with multiple falls and worsening deconditioning.  Upon ED arrival patient was seen afebrile mildly hypertensive lab work significant for multiple metabolic derangements including severe AKI, positive lactic acidosis, hyponatremia, anion gap, and leukocytosis.  Midmorning of 10/29 patient seen with further decline with likely need of emergent HD due to progressive renal failure.  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, cavitary lung lesion status post bronchoscopy with biopsy negative for malignancy, type 2 diabetes, hypertension,  hyperlipidemia, arthritis, and recent diagnosis of Alzheimer's disease  Significant Hospital Events: Including procedures, antibiotic start and stop dates in addition to other pertinent events   10/28 admitted after being found unresponsive by spouse, hypoglycemic with severe AKI on admission 10/29 nephrology consulted admitted to ICU hemodialysis  Interim History / Subjective:  Encephalopathic Family updated at bedside  Objective   Blood pressure (!) 143/42, pulse 90, temperature (!) 97.5 F (36.4 C), temperature source Axillary, resp. rate (!) 26, height $RemoveBe'5\' 2"'tKabnbXLp$  (1.575 m), weight 67.2 kg, SpO2 98 %.        Intake/Output Summary (Last 24 hours) at 07/22/2021 1027 Last data filed at 07/22/2021 4098 Gross per 24 hour  Intake 2248.25 ml  Output --  Net 2248.25 ml   Filed Weights   07/21/21 1258  Weight: 67.2 kg    Examination: General: Acute on chronically ill appearing elderly female lying in bed in NAD HEENT: Bray/AT, MM pink/moist, PERRL,  Neuro: Encephalopathic CV: s1s2 regular rate and rhythm, no murmur, rubs, or gallops,  PULM: Clear to auscultation bilaterally, tachypnea, mild increased work of GI: soft, bowel sounds active in all 4 quadrants, non-tender, non-distended Extremities: warm/dry, non-pitting lower edema  Skin: no rashes or lesions  Resolved Hospital Problem list     Assessment & Plan:  At risk for acute hypoxic respiratory failure  -Due to severe metabolic derangements with lactic acidosis patient is tachypneic in and attempt to blow off CO2, respiratory fatigue is possible  P: Transfer to ICU for close monitoring  Monitor for signs of respirator fatigue  Head of bed elevated 30 degrees Follow intermittent chest x-ray and ABG Ensure adequate pulmonary hygiene  Follow cultures  Empiric antibiotics as below   Acute Kidney Injury superimposed on CKD stage IIIa -Likely multifactorial including poor oral intake, medication side effect, and  possible  pyelonephritis  Hyperkalemia  -Peaked at 6.4 P: Nephrology following, appreciate assistance  Continue IV hydration  Obtain CT ABD/pelvis and renal US Will need HD cath placed and CRRT  Follow renal function  Monitor urine output Trend Bmet Avoid nephrotoxins Ensure adequate renal perfusion  Continue to hold home Metformin and Ramipril   Sever sepsis -Current sepsis criteria include; RR 34, leucocytosis with WBC 13.4, lactic acid > 9, and AMS with concern for underlying infection  -Criteria:  Temperature less than 96.8 or greater than 101, respiratory rate greater than 20/min, heart rate greater than 90 bpm, acutely altered mental status, WBC greater than 12 or less than 4 + suspected infection = sepsis -source: P: Transfer to  ICU Supplemental oxygen if needed to maintain sats greater then 90 Follow cultures  Continue empiric Unasyn  IV hydration  Trend lactic acid Procalcitonin Monitor urine output  Acute metabolic encephalopathy  -Multifactorial including acute infection, hypoglycemia, and possible underlying Alzheimer  P: Continue to treat underlying cause  Closely monitor glucose  Maintain neuro protective measures; goal for eurothermia, euglycemia, eunatermia, normoxia, and PCO2 goal of 35-40 Nutrition and bowel regiment  Seizure precautions  Aspirations precautions  Frequent neuro checks NPO  Type 2 diabetes  -Home medications include Glipizide, and Metformin  P: Hold home medications  SSI CBG checks q4 may need to move to q2  Hypertension -Home medications include Amlodipine and Ramipril  P: Hold home oral agents given encephalopathy  Utilize PRN antihypertensives  Continuous telemetry  Generalized weakness  Failure to thrive  -Family reports progressive functional decline over the last 6-8 months. Patient has been falling at home secondary to weakness and poor ambulation At risk for malnutrition   P: May need to consider [place] for nutrition If  mentation doesn't improve  PT/OT when able  Supportive care   Hyponatremia P: Trend bmet  Continue IV hydration   Hx of cavitary lung lesion -Tissue biopsies were negative for malignancy there was atypical cells noted on one slide however favored to be reactive bronchial epithelial cells P: Follow up with outpatient pulmonary   Best Practice (right click and "Reselect all SmartList Selections" daily)   Diet/type: NPO DVT prophylaxis: prophylactic heparin  GI prophylaxis: PPI Lines: Dialysis Catheter Foley:  Yes, and it is still needed Code Status:  limited Last date of multidisciplinary goals of care discussion: Family wishes to treat all treatable causes. They do not want to cause any undo suffering though and if medical interventions will no lead to quality of life they would like to consider comfort care. Are willing to provide aggressive interventions including intubation and CRRT for short time.    Labs   CBC: Recent Labs  Lab 07/21/21 1330 07/21/21 2347  WBC 13.1* 13.4*  NEUTROABS 10.1*  --   HGB 10.6* 10.2*  HCT 32.0* 30.5*  MCV 92.2 91.9  PLT 287 426    Basic Metabolic Panel: Recent Labs  Lab 07/21/21 1330 07/21/21 2347 07/22/21 0534  NA 124* 131* 129*  K 5.8* 6.4* 5.8*  CL 94* 102 98  CO2 12* 11* 7*  GLUCOSE 156* 43* 204*  BUN 54* 59* 60*  CREATININE 5.50* 5.96* 6.47*  CALCIUM 8.8* 8.4* 8.1*   GFR: Estimated Creatinine Clearance: 6.3 mL/min (A) (by C-G formula based on SCr of 6.47 mg/dL (H)). Recent Labs  Lab 07/21/21 1330 07/21/21 1624 07/21/21 2347 07/22/21 0534  WBC 13.1*  --  13.4*  --   LATICACIDVEN 5.7* 5.8* 5.2* >9.0*  Liver Function Tests: Recent Labs  Lab 07/21/21 1330 07/21/21 2347  AST 27 25  ALT 19 18  ALKPHOS 88 85  BILITOT 0.8 0.6  PROT 6.5 5.9*  ALBUMIN 3.3* 3.0*   Recent Labs  Lab 07/21/21 1330  LIPASE 25   Recent Labs  Lab 07/21/21 1330  AMMONIA 12    ABG    Component Value Date/Time   TCO2 27  12/24/2017 0943     Coagulation Profile: Recent Labs  Lab 07/21/21 1330  INR 1.0    Cardiac Enzymes: Recent Labs  Lab 07/21/21 1857  CKTOTAL 82    HbA1C: Hemoglobin A1C  Date/Time Value Ref Range Status  01/31/2021 04:14 PM 7.2 (A) 4.0 - 5.6 % Final  12/29/2019 10:58 AM 6.5 (A) 4.0 - 5.6 % Final   Hgb A1c MFr Bld  Date/Time Value Ref Range Status  07/21/2021 06:57 PM 6.1 (H) 4.8 - 5.6 % Final    Comment:    (NOTE) Pre diabetes:          5.7%-6.4%  Diabetes:              >6.4%  Glycemic control for   <7.0% adults with diabetes   07/18/2015 01:50 PM 8.4 (H) 4.6 - 6.5 % Final    Comment:    Glycemic Control Guidelines for People with Diabetes:Non Diabetic:  <6%Goal of Therapy: <7%Additional Action Suggested:  >8%     CBG: Recent Labs  Lab 07/21/21 1348 07/22/21 0133 07/22/21 0153 07/22/21 0422 07/22/21 0946  GLUCAP 136* 21* 167* 142* 323*    Review of Systems:   Unable to assess   Past Medical History:  She,  has a past medical history of Arthritis, Cancer ([place]) (1998), Chronic back pain, Diabetes mellitus without complication ([place]), History of Raynaud's syndrome, Hyperlipemia, Hypertension, Psoriasis, Sarcoidosis, Wears glasses, and Wears partial dentures.   Surgical History:   Past Surgical History:  Procedure Laterality Date   ABDOMINAL HYSTERECTOMY     APPENDECTOMY     BRONCHIAL BIOPSY  06/27/2021   Procedure: BRONCHIAL BIOPSIES;  Surgeon: Garner Nash, DO;  Location: [facility];  Service: Pulmonary;;   BRONCHIAL BRUSHINGS  06/27/2021   Procedure: BRONCHIAL BRUSHINGS;  Surgeon: Garner Nash, DO;  Location: [facility];  Service: Pulmonary;;   BRONCHIAL WASHINGS  06/27/2021   Procedure: BRONCHIAL WASHINGS;  Surgeon: Garner Nash, DO;  Location: [facility];  Service: Pulmonary;;   CATARACT EXTRACTION, BILATERAL     COLONOSCOPY     many   COMBINED MEDIASTINOSCOPY AND BRONCHOSCOPY  2002   biopsy   DG ARTHRO THUMB*R*  2011    ablasion rt thumb nailbed   FIDUCIAL MARKER PLACEMENT  06/27/2021   Procedure: FIDUCIAL MARKER PLACEMENT;  Surgeon: Garner Nash, DO;  Location: [facility];  Service: Pulmonary;;   FORAMINOTOMY 1 LEVEL Bilateral 05/09/2018   Procedure: Foraminotomy L4-L5 Root;  Surgeon: [name], MD;  Location: [facility];  Service: Orthopedics;  Laterality: Bilateral;   I & D EXTREMITY Right 07/23/2013   Procedure: IRRIGATION AND DEBRIDEMENT DIP JOINT RIGHT INDEX FINGER, EXCISE MUCOID CYST RIGHT INDEX FINGER  ;  Surgeon: Cammie Sickle., MD;  Location: [facility];  Service: Orthopedics;  Laterality: Right;   LIPOMA EXCISION     LUMBAR LAMINECTOMY/DECOMPRESSION MICRODISCECTOMY N/A 05/09/2018   Procedure: Complete decompressive lumbar laminectomy L4-L5;  Surgeon: [name], MD;  Location: [facility];  Service: Orthopedics;  Laterality: N/A;  163mn   MASS EXCISION Right 09/28/2014  Procedure: EXCISION MASS RIGHT MIDDLE FINGER;  Surgeon: Daryll Brod, MD;  Location: [facility];  Service: Orthopedics;  Laterality: Right;  ANESTHESIA:  GENERAL, IV REGIONAL FAB   MASS EXCISION Bilateral 12/24/2017   Procedure: EXCISION MASS RIGHT SMALL, LEFT INDEX, excision left nail horn middle finger;  Surgeon: Daryll Brod, MD;  Location: [facility];  Service: Orthopedics;  Laterality: Bilateral;  FAB   NAILBED REPAIR Left 08/28/2016   Procedure: Ablation nail matrix left index finger andl left middle finger;  Surgeon: Daryll Brod, MD;  Location: [facility];  Service: Orthopedics;  Laterality: Left;   PARTIAL COLECTOMY  1998   cancer-rad/chemo   SKIN FULL THICKNESS GRAFT Right 07/23/2013   Procedure: NAIL PLATE ABLATION WITH FULL THICKNESS SKIN GRAFT FROM RIGHT   MEDIAL ELBOW TO RIGHT LONG FINGER NAIL BED;  Surgeon: Cammie Sickle., MD;  Location: [facility];  Service: Orthopedics;  Laterality: Right;   SKIN FULL THICKNESS GRAFT Left 08/28/2016    Procedure: SKIN GRAFT FULL THICKNESS upper left arm;  Surgeon: Daryll Brod, MD;  Location: [facility];  Service: Orthopedics;  Laterality: Left;   VIDEO BRONCHOSCOPY WITH ENDOBRONCHIAL NAVIGATION Bilateral 06/27/2021   Procedure: VIDEO BRONCHOSCOPY WITH ENDOBRONCHIAL NAVIGATION;  Surgeon: Garner Nash, DO;  Location: [facility];  Service: Pulmonary;  Laterality: Bilateral;  ION, possible fiducial placement   VIDEO BRONCHOSCOPY WITH RADIAL ENDOBRONCHIAL ULTRASOUND  06/27/2021   Procedure: RADIAL ENDOBRONCHIAL ULTRASOUND;  Surgeon: Garner Nash, DO;  Location: [facility];  Service: Pulmonary;;     Social History:   reports that she quit smoking about 50 years ago. Her smoking use included cigarettes. She smoked an average of .5 packs per day. She has never used smokeless tobacco. She reports that she does not drink alcohol and does not use drugs.   Family History:  Her family history includes CVA in her father; Cancer in her mother; Hypertension in her father; Obesity in her brother.   Allergies No Known Allergies   Home Medications  Prior to Admission medications   Medication Sig Start Date End Date Taking? Authorizing Provider  acetaminophen (TYLENOL) 325 MG tablet Take 325-650 mg by mouth 2 (two) times daily as needed for moderate pain.    Yes [provider]  amLODipine (NORVASC) 5 MG tablet Take 5 mg by mouth daily.   Yes [provider]  AZO-CRANBERRY PO Take 1 tablet by mouth daily.   Yes [provider]  D-MANNOSE PO Take 1,300 mg by mouth daily.   Yes [provider]  famotidine (PEPCID) 20 MG tablet Take 20 mg by mouth 2 (two) times daily.   Yes [provider]  Flavoring Agent (MARSHMALLOW ARTIFICIAL FLAVOR) CONC Take 1 capsule by mouth daily.   Yes [provider]  glipiZIDE (GLUCOTROL XL) 10 MG 24 hr tablet TAKE 2 TABLETs in am and 1 tablet before dinner Patient taking differently: Take 10-20 mg by  mouth in the morning and at bedtime. Take 68m in the morning and 132mat night. 01/31/21  Yes GhPhilemon KingdomMD  insulin degludec (TRESIBA FLEXTOUCH) 100 UNIT/ML FlexTouch Pen INJECT 15 UNITS SUBCUTANEOUSLY DAILY Patient taking differently: Inject 15 Units into the skin at bedtime as needed (high blood sugar levels). 01/31/21  Yes GhPhilemon KingdomMD  metFORMIN (GLUCOPHAGE) 1000 MG tablet TAKE 1 TABLET TWICE DAILY WITH MEALS Patient taking differently: Take 1,000 mg by mouth 2 (two) times daily with a meal. 01/31/21  Yes Philemon Kingdom, MD  Multiple Vitamin (MULTIVITAMIN) tablet Take 1 tablet by mouth daily.   Yes [provider]  ramipril (ALTACE) 10 MG capsule Take 10 mg by mouth daily.   Yes [provider]  Blood Glucose Monitoring Suppl (ONETOUCH VERIO) w/Device KIT Use as instructed to check blood sugar 4 times daily 05/25/21   Philemon Kingdom, MD  DROPLET PEN NEEDLES 32G X 5 MM MISC USE TO INJECT INSULIN ONE TIME DAILY AS DIRECTED (NEED APPOINTMENT FOR FURTHER REFILLS) 04/10/21   Philemon Kingdom, MD  glucose blood (ONETOUCH VERIO) test strip Use as instructed to check blood sugar 4 times daily 05/25/21   Philemon Kingdom, MD  Lancets [facility] ULTRASOFT) lancets Use as instructed to check blood sugar 4 times daily 05/25/21   Philemon Kingdom, MD     Critical care time:   CRITICAL CARE Performed by: Nykeria Mealing D. Harris  Total critical care time: 50 minutes  Critical care time was exclusive of separately billable procedures and treating other patients.  Critical care was necessary to treat or prevent imminent or life-threatening deterioration.  Critical care was time spent personally by me on the following activities: development of treatment plan with patient and/or surrogate as well as nursing, discussions with consultants, evaluation of patient's response to treatment, examination of patient, obtaining history from patient or surrogate, ordering and performing  treatments and interventions, ordering and review of laboratory studies, ordering and review of radiographic studies, pulse oximetry and re-evaluation of patient's condition.  Samreet Edenfield D. Kenton Kingfisher, NP-C [facility] Personal contact information can be found on Amion  07/22/2021, 11:18 AM

## 2021-07-22 NOTE — Progress Notes (Signed)
Date and time results received: 07/22/21 1524 (use smartphrase ".now" to insert current time)  Test: potassium  Critical Value: 6.4  Potassium known to be elevated  Orders Received? Or Actions Taken?: This lab was drawn at 1338 prior to initiating CRRT. RN proceeded with previously established plan to initiate CRRT and draw renal panel at [facility]

## 2021-07-23 DIAGNOSIS — G9341 Metabolic encephalopathy: Secondary | ICD-10-CM | POA: Diagnosis not present

## 2021-07-23 DIAGNOSIS — W19XXXA Unspecified fall, initial encounter: Secondary | ICD-10-CM | POA: Diagnosis not present

## 2021-07-23 DIAGNOSIS — N1831 Chronic kidney disease, stage 3a: Secondary | ICD-10-CM | POA: Diagnosis not present

## 2021-07-23 DIAGNOSIS — N179 Acute kidney failure, unspecified: Secondary | ICD-10-CM | POA: Diagnosis not present

## 2021-07-23 LAB — CBC WITH DIFFERENTIAL/PLATELET
Abs Immature Granulocytes: 0.07 10*3/uL (ref 0.00–0.07)
Basophils Absolute: 0 10*3/uL (ref 0.0–0.1)
Basophils Relative: 0 %
Eosinophils Absolute: 0 10*3/uL (ref 0.0–0.5)
Eosinophils Relative: 0 %
HCT: 22.8 % — ABNORMAL LOW (ref 36.0–46.0)
Hemoglobin: 7.9 g/dL — ABNORMAL LOW (ref 12.0–15.0)
Immature Granulocytes: 0 %
Lymphocytes Relative: 19 %
Lymphs Abs: 3.3 10*3/uL (ref 0.7–4.0)
MCH: 30.9 pg (ref 26.0–34.0)
MCHC: 34.6 g/dL (ref 30.0–36.0)
MCV: 89.1 fL (ref 80.0–100.0)
Monocytes Absolute: 1.1 10*3/uL — ABNORMAL HIGH (ref 0.1–1.0)
Monocytes Relative: 6 %
Neutro Abs: 12.9 10*3/uL — ABNORMAL HIGH (ref 1.7–7.7)
Neutrophils Relative %: 75 %
Platelets: 126 10*3/uL — ABNORMAL LOW (ref 150–400)
RBC: 2.56 MIL/uL — ABNORMAL LOW (ref 3.87–5.11)
RDW: 13.7 % (ref 11.5–15.5)
WBC: 17.4 10*3/uL — ABNORMAL HIGH (ref 4.0–10.5)
nRBC: 0 % (ref 0.0–0.2)

## 2021-07-23 LAB — RENAL FUNCTION PANEL
Albumin: 2.5 g/dL — ABNORMAL LOW (ref 3.5–5.0)
Albumin: 2.7 g/dL — ABNORMAL LOW (ref 3.5–5.0)
Anion gap: 9 (ref 5–15)
Anion gap: 7 (ref 5–15)
BUN: 27 mg/dL — ABNORMAL HIGH (ref 8–23)
BUN: 14 mg/dL (ref 8–23)
CO2: 33 mmol/L — ABNORMAL HIGH (ref 22–32)
CO2: 29 mmol/L (ref 22–32)
Calcium: 6.8 mg/dL — ABNORMAL LOW (ref 8.9–10.3)
Calcium: 7.4 mg/dL — ABNORMAL LOW (ref 8.9–10.3)
Chloride: 92 mmol/L — ABNORMAL LOW (ref 98–111)
Chloride: 101 mmol/L (ref 98–111)
Creatinine, Ser: 2.87 mg/dL — ABNORMAL HIGH (ref 0.44–1.00)
Creatinine, Ser: 2.2 mg/dL — ABNORMAL HIGH (ref 0.44–1.00)
GFR, Estimated: 16 mL/min — ABNORMAL LOW (ref >60)
GFR, Estimated: 22 mL/min — ABNORMAL LOW (ref >60)
Glucose, Bld: 47 mg/dL — ABNORMAL LOW (ref 70–99)
Glucose, Bld: 92 mg/dL (ref 70–99)
Phosphorus: 3 mg/dL (ref 2.5–4.6)
Phosphorus: 2.1 mg/dL — ABNORMAL LOW (ref 2.5–4.6)
Potassium: 3.3 mmol/L — ABNORMAL LOW (ref 3.5–5.1)
Potassium: 4.2 mmol/L (ref 3.5–5.1)
Sodium: 134 mmol/L — ABNORMAL LOW (ref 135–145)
Sodium: 137 mmol/L (ref 135–145)

## 2021-07-23 LAB — BASIC METABOLIC PANEL
Anion gap: 18 — ABNORMAL HIGH (ref 5–15)
Anion gap: 8 (ref 5–15)
BUN: 40 mg/dL — ABNORMAL HIGH (ref 8–23)
BUN: 26 mg/dL — ABNORMAL HIGH (ref 8–23)
CO2: 22 mmol/L (ref 22–32)
CO2: 33 mmol/L — ABNORMAL HIGH (ref 22–32)
Calcium: 7.2 mg/dL — ABNORMAL LOW (ref 8.9–10.3)
Calcium: 6.8 mg/dL — ABNORMAL LOW (ref 8.9–10.3)
Chloride: 93 mmol/L — ABNORMAL LOW (ref 98–111)
Chloride: 93 mmol/L — ABNORMAL LOW (ref 98–111)
Creatinine, Ser: 4.28 mg/dL — ABNORMAL HIGH (ref 0.44–1.00)
Creatinine, Ser: 2.85 mg/dL — ABNORMAL HIGH (ref 0.44–1.00)
GFR, Estimated: 10 mL/min — ABNORMAL LOW (ref >60)
GFR, Estimated: 16 mL/min — ABNORMAL LOW (ref >60)
Glucose, Bld: 90 mg/dL (ref 70–99)
Glucose, Bld: 48 mg/dL — ABNORMAL LOW (ref 70–99)
Potassium: 4.1 mmol/L (ref 3.5–5.1)
Potassium: 3.4 mmol/L — ABNORMAL LOW (ref 3.5–5.1)
Sodium: 133 mmol/L — ABNORMAL LOW (ref 135–145)
Sodium: 134 mmol/L — ABNORMAL LOW (ref 135–145)

## 2021-07-23 LAB — C3 COMPLEMENT: C3 Complement: 107 mg/dL (ref 82–167)

## 2021-07-23 LAB — CBC
HCT: 24.2 % — ABNORMAL LOW (ref 36.0–46.0)
Hemoglobin: 8.1 g/dL — ABNORMAL LOW (ref 12.0–15.0)
MCH: 30.6 pg (ref 26.0–34.0)
MCHC: 33.5 g/dL (ref 30.0–36.0)
MCV: 91.3 fL (ref 80.0–100.0)
Platelets: 121 10*3/uL — ABNORMAL LOW (ref 150–400)
RBC: 2.65 MIL/uL — ABNORMAL LOW (ref 3.87–5.11)
RDW: 14.1 % (ref 11.5–15.5)
WBC: 17.1 10*3/uL — ABNORMAL HIGH (ref 4.0–10.5)
nRBC: 0 % (ref 0.0–0.2)

## 2021-07-23 LAB — GLUCOSE, CAPILLARY
Glucose-Capillary: 43 mg/dL — CL (ref 70–99)
Glucose-Capillary: 159 mg/dL — ABNORMAL HIGH (ref 70–99)
Glucose-Capillary: 149 mg/dL — ABNORMAL HIGH (ref 70–99)
Glucose-Capillary: 46 mg/dL — ABNORMAL LOW (ref 70–99)
Glucose-Capillary: 105 mg/dL — ABNORMAL HIGH (ref 70–99)
Glucose-Capillary: 73 mg/dL (ref 70–99)
Glucose-Capillary: 86 mg/dL (ref 70–99)
Glucose-Capillary: 85 mg/dL (ref 70–99)
Glucose-Capillary: 105 mg/dL — ABNORMAL HIGH (ref 70–99)
Glucose-Capillary: 128 mg/dL — ABNORMAL HIGH (ref 70–99)

## 2021-07-23 LAB — POTASSIUM
Potassium: 4 mmol/L (ref 3.5–5.1)
Potassium: 4.1 mmol/L (ref 3.5–5.1)

## 2021-07-23 LAB — MAGNESIUM
Magnesium: 1.8 mg/dL (ref 1.7–2.4)
Magnesium: 1.8 mg/dL (ref 1.7–2.4)

## 2021-07-23 LAB — PROCALCITONIN: Procalcitonin: 0.61 ng/mL

## 2021-07-23 LAB — C4 COMPLEMENT: Complement C4, Body Fluid: 27 mg/dL (ref 12–38)

## 2021-07-23 LAB — ANTISTREPTOLYSIN O TITER: ASO: 40 IU/mL (ref 0.0–200.0)

## 2021-07-23 LAB — LACTIC ACID, PLASMA: Lactic Acid, Venous: 1.7 mmol/L (ref 0.5–1.9)

## 2021-07-23 MED ORDER — PRISMASOL BGK 4/2.5 32-4-2.5 MEQ/L REPLACEMENT SOLN: Status: DC

## 2021-07-23 MED ORDER — DEXTROSE 50 % IV SOLN: 25.0000 g | INTRAVENOUS | Status: AC

## 2021-07-23 MED ORDER — LIP MEDEX EX OINT
TOPICAL_OINTMENT | CUTANEOUS | Status: DC | PRN
  Filled 2021-07-23: qty 7

## 2021-07-23 MED ORDER — DEXTROSE 50 % IV SOLN: 50.0000 mL | Freq: Once | INTRAVENOUS | Status: AC

## 2021-07-23 MED ORDER — DEXTROSE 50 % IV SOLN
INTRAVENOUS | Status: AC
  Administered 2021-07-23: 50 mL via INTRAVENOUS
  Filled 2021-07-23: qty 50

## 2021-07-23 MED ORDER — CALCIUM GLUCONATE-NACL 1-0.675 GM/50ML-% IV SOLN
1.0000 g | Freq: Once | INTRAVENOUS | Status: AC
  Administered 2021-07-23: 1000 mg via INTRAVENOUS
  Filled 2021-07-23: qty 50

## 2021-07-23 MED ORDER — PRISMASOL BGK 4/2.5 32-4-2.5 MEQ/L EC SOLN: Status: DC

## 2021-07-23 MED ORDER — DEXTROSE 10 % IV SOLN: INTRAVENOUS | Status: DC

## 2021-07-23 MED ORDER — SODIUM CHLORIDE 0.9 % IV SOLN
3.0000 g | Freq: Three times a day (TID) | INTRAVENOUS | Status: DC
  Administered 2021-07-23 – 2021-07-25 (×6): 3 g via INTRAVENOUS
  Filled 2021-07-23 (×8): qty 8

## 2021-07-23 MED ORDER — POTASSIUM PHOSPHATES 15 MMOLE/5ML IV SOLN
30.0000 mmol | Freq: Once | INTRAVENOUS | Status: AC
  Administered 2021-07-23: 30 mmol via INTRAVENOUS
  Filled 2021-07-23: qty 10

## 2021-07-23 MED ORDER — DEXTROSE 50 % IV SOLN
INTRAVENOUS | Status: AC
  Administered 2021-07-23: 25 g via INTRAVENOUS
  Filled 2021-07-23: qty 50

## 2021-07-23 NOTE — Progress Notes (Signed)
NAME:  Beth [name], MRN:  [id number], DOB:  [birth year], LOS: 2 ADMISSION DATE:  07/21/2021, CONSULTATION DATE: 10/29 REFERRING MD:  Dr. 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  Upon EMS arrival patient was seen significantly hypoglycemic with a blood sugar of 51.  Per family patient has had persistent medical and cognitive decline since July of this year after being diagnosed with urinary tract infection.  She reports multiple primary care visits and specialty visits to determine cause of decline.  Reports she has been diagnosed with approximately 4 urinary tract infections all treated with antibiotic therapy since July.  Most recently patient completed 10-day course of amoxicillin after cultures resulted with Enterococcus 10/12.  Daughter additionally reports poor oral intake for several weeks with multiple falls and worsening deconditioning.  Upon ED arrival patient was seen afebrile mildly hypertensive lab work significant for multiple metabolic derangements including severe AKI, positive lactic acidosis, hyponatremia, anion gap, and leukocytosis.  Midmorning of 10/29 patient seen with further decline with likely need of emergent HD due to progressive renal failure.  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, cavitary lung lesion status post bronchoscopy with biopsy negative for malignancy, type 2 diabetes, hypertension,  hyperlipidemia, arthritis, and recent diagnosis of Alzheimer's disease  Significant Hospital Events: Including procedures, antibiotic start and stop dates in addition to other pertinent events   10/28 admitted after being found unresponsive by spouse, hypoglycemic with severe AKI on admission 10/29 nephrology consulted admitted to ICU hemodialysis 10/30 tolerated HD overnight, hgb did drop to 7.9 this AM  10/30 CT ab/pelv > no acute abnormality but motion degraded study  Interim History / Subjective:   More awake, alert, garbled speech but conversant Remains on CRRT  Objective   Blood pressure (!) 143/52, pulse 86, temperature (!) 96.1 F (35.6 C), resp. rate 20, height 5\' 2"  (1.575 m), weight 67.2 kg, SpO2 98 %.        Intake/Output Summary (Last 24 hours) at 07/23/2021 0950 Last data filed at 07/23/2021 0900 Gross per 24 hour  Intake 6328.96 ml  Output 3650 ml  Net 2678.96 ml    Filed Weights   07/21/21 1258  Weight: 67.2 kg    Examination: General:  Elderly female, resting comfortably in bed HENT: NCAT OP clear PULM: CTA B, normal effort CV: RRR, no mgr GI: BS+, soft, nontender MSK: diminished bulk and tone Neuro: awake, alert, no distress, MAEW   Resolved Hospital Problem list   Hyponatremia  Assessment & Plan:  Acute Kidney Injury superimposed on CKD stage IIIa -Likely multifactorial including poor oral intake, medication side effect, and possible pyelonephritis  Hyperkalemia  P: Continue CRRT Monitor UOP Monitor BMET Renal dose meds Holding home metformin/ramipril  Severe sepsis, possible aspiration pneumonia? Had physiology concerning for sepsis on 10/27-10/28, but there is no clear evidence of infection P: Monitor cultures Continue unasyn for now but if continues to improve by 10/31 would stop them  Acute metabolic encephalopathy due to uremia > improved -Multifactorial including acute infection, hypoglycemia, and possible underlying  Alzheimer   P: Aspiration precautions Hold sedatives  Type 2 diabetes with recurrent hypoglycemia, not taking po -Home medications include Glipizide, and Metformin  P: Holding home hypoglycemics Continue D10 CBG monitoring q4 Encourage PO intake, if glucose rises once taking po then stop D10  Hypertension with developed hypotension when CRRT started  -Home medications include Amlodipine and Ramipril  P: Holding home antihypertensives  Generalized weakness  Failure to thrive  -Family reports progressive functional decline over the last 6-8 months. Patient has been falling at home secondary to weakness and poor ambulation At risk for malnutrition   P: Advance diet  Hx of cavitary lung lesion -Tissue biopsies were negative for malignancy there was atypical cells noted on one slide however favored to be reactive bronchial epithelial cells P: Outpatient pulm follow up  Best Practice (right click and "Reselect all SmartList Selections" daily)   Diet/type: NPO DVT prophylaxis: prophylactic heparin  GI prophylaxis: PPI Lines: Dialysis Catheter Foley:  Yes, and it is still needed Code Status:  limited Last date of multidisciplinary goals of care discussion: Family wishes to treat all treatable causes. They do not want to cause any undo suffering though and if medical interventions will no lead to quality of life they would like to consider comfort care. Are willing to provide aggressive interventions including intubation and CRRT for short time.   Critical care time:  35 minutes   Roselie Awkward, MD [place] P[facility]M Pager: [phone] Cell: [phone] After 7:00 pm call Elink  [phone]

## 2021-07-23 NOTE — Progress Notes (Signed)
Pharmacy Antibiotic Note  Beth [name] is a 80 y.o. female admitted on 07/21/2021 with UTI 2/2 E Faecalis (was on Augmentin PO x 5d PTA + Salmonella coverage from eating recalled JIF).  Pharmacy has been consulted for Unasyn dosing. CRRT started will adjust Unasyn doing   Plan:  Unasyn 3g IV q8h  -Monitor renal function, clinical status, and antibiotic plan  Height: 5\' 2"  (157.5 cm) Weight: 67.2 kg (148 lb 2.4 oz) IBW/kg (Calculated) : 50.1  Temp (24hrs), Avg:97.1 F (36.2 C), Min:94.1 F (34.5 C), Max:99.1 F (37.3 C)  Recent Labs  Lab 07/21/21 1330 07/21/21 1624 07/21/21 2347 07/22/21 0534 07/22/21 1117 07/22/21 1339 07/22/21 1600 07/22/21 2154 07/23/21 0401 07/23/21 0408 07/23/21 1005  WBC 13.1*  --  13.4*  --   --   --   --   --   --  17.4*  --   CREATININE 5.50*  --  5.96* 6.47*  --   --  5.28* 4.28* 2.85*  2.87*  --   --   LATICACIDVEN 5.7*   < > 5.2* >9.0* >9.0* >9.0*  --   --   --   --  1.7   < > = values in this interval not displayed.     Estimated Creatinine Clearance: 14.4 mL/min (A) (by C-G formula based on SCr of 2.85 mg/dL (H)).    No Known Allergies  Antimicrobials this admission: Unasyn 10/2/8 >>     Bonnita Nasuti Pharm.D. CPP, BCPS Clinical Pharmacist [phone] 07/23/2021 2:31 PM

## 2021-07-23 NOTE — Progress Notes (Signed)
[facility] Progress Note Patient Name: Beth [name] DOB: 01/10/1941 MRN: [id number]   Date of Service  07/23/2021  HPI/Events of Note  Blood sugar 43 mg / dl on a random CBG check, she's had a number of episodes this admission.  eICU Interventions  CBG Q 4 hours ordered, D 10 % water gtt ordered to run at 10 ml / hour for now.        Kerry Kass Geary Rufo 07/23/2021, 5:01 AM

## 2021-07-23 NOTE — Progress Notes (Signed)
Patient ID: Beth [name], female   DOB: 1941/02/21, 80 y.o.   MRN: [id number] S:More comfortable this morning and tolerating CRRT well.  Dialysate changed overnight due to hypokalemia and alkalosis. O:BP (!) 154/63   Pulse 86   Temp (!) 97 F (36.1 C)   Resp 19   Ht 5\' 2"  (1.575 m)   Wt 67.2 kg   SpO2 96%   BMI 27.10 kg/m   Intake/Output Summary (Last 24 hours) at 07/23/2021 1240 Last data filed at 07/23/2021 1200 Gross per 24 hour  Intake 4783.03 ml  Output 4099 ml  Net 684.03 ml   Intake/Output: I/O last 3 completed shifts: In: 7359.4 [I.V.:7159.4; IV Piggyback:200] Out: 3360 [Other:3360]  Intake/Output this shift:  Total I/O In: 432.3 [I.V.:432.3] Out: 739 [Urine:10; Other:729] Weight change:  Gen:NAD, resting comfortably CVS:RRR no rub Resp:CTA  Abd:+BS, soft, NT/ND Ext: no edema  Recent Labs  Lab 07/21/21 1330 07/21/21 2347 07/22/21 0534 07/22/21 1117 07/22/21 1338 07/22/21 1600 07/22/21 2152 07/22/21 2154 07/23/21 0401 07/23/21 1005  NA 124* 131* 129*  --   --  133*  --  133* 134*  134*  --   K 5.8* 6.4* 5.8* 6.5* 6.4* 4.9 4.2 4.1 3.4*  3.3* 4.0  CL 94* 102 98  --   --  96*  --  93* 93*  92*  --   CO2 12* 11* 7*  --   --  14*  --  22 33*  33*  --   GLUCOSE 156* 43* 204*  --   --  314*  --  90 48*  47*  --   BUN 54* 59* 60*  --   --  51*  --  40* 26*  27*  --   CREATININE 5.50* 5.96* 6.47*  --   --  5.28*  --  4.28* 2.85*  2.87*  --   ALBUMIN 3.3* 3.0*  --   --   --  2.4*  --   --  2.5*  --   CALCIUM 8.8* 8.4* 8.1*  --   --  7.0*  --  7.2* 6.8*  6.8*  --   PHOS  --   --   --   --   --  5.9*  --   --  3.0  --   AST 27 25  --   --   --   --   --   --   --   --   ALT 19 18  --   --   --   --   --   --   --   --    Liver Function Tests: Recent Labs  Lab 07/21/21 1330 07/21/21 2347 07/22/21 1600 07/23/21 0401  AST 27 25  --   --   ALT 19 18  --   --   ALKPHOS 88 85  --   --   BILITOT 0.8 0.6  --   --   PROT 6.5 5.9*  --   --   ALBUMIN  3.3* 3.0* 2.4* 2.5*   Recent Labs  Lab 07/21/21 1330  LIPASE 25   Recent Labs  Lab 07/21/21 1330  AMMONIA 12   CBC: Recent Labs  Lab 07/21/21 1330 07/21/21 2347 07/23/21 0408  WBC 13.1* 13.4* 17.4*  NEUTROABS 10.1*  --  12.9*  HGB 10.6* 10.2* 7.9*  HCT 32.0* 30.5* 22.8*  MCV 92.2 91.9 89.1  PLT 287 287 126*   Cardiac Enzymes: Recent Labs  Lab 07/21/21 1857  CKTOTAL 82   CBG: Recent Labs  Lab 07/23/21 0504 07/23/21 0756 07/23/21 0823 07/23/21 1004 07/23/21 1212  GLUCAP 159* 46* 149* 105* 73    Iron Studies: No results for input(s): IRON, TIBC, TRANSFERRIN, FERRITIN in the last 72 hours. Studies/Results: CT ABDOMEN PELVIS WO CONTRAST  Result Date: 07/22/2021 CLINICAL DATA:  Nausea and vomiting, renal failure EXAM: CT ABDOMEN AND PELVIS WITHOUT CONTRAST TECHNIQUE: Multidetector CT imaging of the abdomen and pelvis was performed following the standard protocol without IV contrast. COMPARISON:  PET-CT 05/31/2021 FINDINGS: Significant motion degradation in the upper abdomen. Lower chest: Bilateral pleural effusions. Hepatobiliary: No focal hepatic lesion. Gallbladder mildly dilated 3. Cm. No radiodense gallstones evident. Pancreas: No pancreatic inflammation. Spleen: Normal spleen Adrenals/urinary tract: Adrenal glands and kidneys are normal. No ureteral lithiasis. Bladder collapsed around a Foley catheter. Stomach/Bowel: Stomach, duodenum and small-bowel appear normal. Appendix not identified. The colon and rectosigmoid colon are normal. There are diverticula of the sigmoid colon without acute inflammation. Rectum normal. Vascular/Lymphatic: Abdominal aorta is normal caliber with atherosclerotic calcification. There is no retroperitoneal or periportal lymphadenopathy. No pelvic lymphadenopathy. Reproductive: Post hysterectomy.  Adnexa unremarkable Other: There surgical clips beneath the LEFT hemidiaphragm Musculoskeletal: Degenerative osteophytosis of the spine. IMPRESSION:  1. Exam is degraded by significant respiratory motion and patient motion which does limit evaluation of the upper abdomen. 2. No acute findings in the abdomen pelvis. 3. Mild bilateral pleural effusions. 4. Foley catheter within collapsed bladder. 5.  Aortic Atherosclerosis (ICD10-I70.0). Electronically Signed   By: Suzy Bouchard M.D.   On: 07/22/2021 12:42   CT HEAD WO CONTRAST (5MM)  Result Date: 07/21/2021 CLINICAL DATA:  Altered mental status, multiple falls EXAM: CT HEAD WITHOUT CONTRAST TECHNIQUE: Contiguous axial images were obtained from the base of the skull through the vertex without intravenous contrast. COMPARISON:  Brain MRI 05/28/2021, CT head 07/05/2021 FINDINGS: Brain: There is no evidence of acute intracranial hemorrhage, extra-axial fluid collection, or acute infarct. There is unchanged mild parenchymal volume loss with commensurate enlargement of the ventricular system. Hypodensity in the subcortical and periventricular white matter is unchanged, likely reflecting sequela of chronic white matter microangiopathy. There is no mass lesion.  There is no midline shift. Vascular: There is calcification of the bilateral cavernous ICAs. Skull: Normal. Negative for fracture or focal lesion. Sinuses/Orbits: Soap bubble appearing secretions are again seen in the right sphenoid sinus. Bilateral lens implants are in place. The globes and orbits are otherwise unremarkable. Other: None. IMPRESSION: 1. No acute intracranial pathology. 2. Unchanged parenchymal volume loss and chronic white matter microangiopathy. Cerebral Atrophy (ICD10-G31.9). Electronically Signed   By: Valetta Mole M.D.   On: 07/21/2021 15:03   US RENAL  Result Date: 07/21/2021 CLINICAL DATA:  Acute renal insufficiency EXAM: RENAL / URINARY TRACT ULTRASOUND COMPLETE COMPARISON:  None. FINDINGS: Right Kidney: Renal measurements: 10.3 x 4.5 x 5.5 cm = volume: 132.1 mL. Increased renal cortical echotexture. No hydronephrosis or renal  mass. Left Kidney: Renal measurements: 8.0 x 4.5 x 3.9 cm = volume: 73.2 mL. Increased renal cortical echotexture. Simple appearing 1 cm cortical cyst. No hydronephrosis. Bladder: Decompressed with a Foley catheter. Other: None. IMPRESSION: 1. Bilateral increased renal cortical echotexture consistent with medical renal disease. 2. Simple left renal cyst. Electronically Signed   By: Randa Ngo M.D.   On: 07/21/2021 21:28   DG CHEST PORT 1 VIEW  Result Date: 07/22/2021 CLINICAL DATA:  Catheter placement. EXAM: PORTABLE CHEST 1 VIEW COMPARISON:  July 21, 2021.  FINDINGS: Stable cardiomediastinal silhouette. Interval placement of right internal jugular catheter with distal tip in expected position of the SVC. Increased bibasilar opacities are noted concerning for worsening edema or infiltrates. No pneumothorax or pleural effusion is noted. Bony thorax is unremarkable. IMPRESSION: Interval placement of right internal jugular catheter with distal tip in expected position of the SVC. Increased bibasilar opacities are noted as described above. Electronically Signed   By: Marijo Conception M.D.   On: 07/22/2021 12:58   DG Chest Portable 1 View  Result Date: 07/21/2021 CLINICAL DATA:  Hypoglycemia, altered mental status EXAM: PORTABLE CHEST 1 VIEW COMPARISON:  07/05/2021 chest radiograph. FINDINGS: Surgical clips overlie the left upper quadrant of the abdomen. Fiducial markers overlie the bilateral lung apices. Stable cardiomediastinal silhouette with normal heart size. No pneumothorax. No pleural effusion. 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 No pulmonary edema. No acute consolidative airspace disease. Chronic mild curvilinear bibasilar scarring. IMPRESSION: 1. No acute cardiopulmonary disease. 2. 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 3. Chronic mild curvilinear bibasilar scarring. Electronically Signed   By: Ilona Sorrel M.D.   On:  07/21/2021 13:57    amLODipine  5 mg Oral Daily   Chlorhexidine Gluconate Cloth  6 each Topical Daily   famotidine  20 mg Oral QHS   insulin aspart  0-6 Units Subcutaneous TID WC   nystatin  5 mL Oral QID   sodium chloride flush  3 mL Intravenous Q12H   sodium zirconium cyclosilicate  10 g Oral Once    BMET    Component Value Date/Time   NA 134 (L) 07/23/2021 0401   NA 134 (L) 07/23/2021 0401   NA 134 (A) 01/28/2015 0000   K 4.0 07/23/2021 1005   CL 93 (L) 07/23/2021 0401   CL 92 (L) 07/23/2021 0401   CO2 33 (H) 07/23/2021 0401   CO2 33 (H) 07/23/2021 0401   GLUCOSE 48 (L) 07/23/2021 0401   GLUCOSE 47 (L) 07/23/2021 0401   BUN 26 (H) 07/23/2021 0401   BUN 27 (H) 07/23/2021 0401   BUN 38 (A) 01/28/2015 0000   CREATININE 2.85 (H) 07/23/2021 0401   CREATININE 2.87 (H) 07/23/2021 0401   CALCIUM 6.8 (L) 07/23/2021 0401   CALCIUM 6.8 (L) 07/23/2021 0401   GFRNONAA 16 (L) 07/23/2021 0401   GFRNONAA 16 (L) 07/23/2021 0401   GFRAA 47 (L) 05/02/2018 0856   CBC    Component Value Date/Time   WBC 17.4 (H) 07/23/2021 0408   RBC 2.56 (L) 07/23/2021 0408   HGB 7.9 (L) 07/23/2021 0408   HCT 22.8 (L) 07/23/2021 0408   PLT 126 (L) 07/23/2021 0408   MCV 89.1 07/23/2021 0408   MCH 30.9 07/23/2021 0408   MCHC 34.6 07/23/2021 0408   RDW 13.7 07/23/2021 0408   LYMPHSABS 3.3 07/23/2021 0408   MONOABS 1.1 (H) 07/23/2021 0408   EOSABS 0.0 07/23/2021 0408   BASOSABS 0.0 07/23/2021 0408     Assessment/Plan:  AKI/CKD stage IIIa - in an elderly woman who has had multiple episodes of recurrent UTI with abx, AMS, poor po intake, N/V, and concomitant ACE inhibition.  Likely some ischemic ATN as underlying issue as her Scr was elevated 2 weeks ago with similar presentation, however also on DDx would be pyelonephritis, AIN, possible pulmonary renal syndrome, or acute GN.  Will order renal US to r/o obstruction and FeNa ordered.  Her daughter denies any NSAID or COX-II I use but is on  several OTC  supplements.  Continue to hold ramipril and metformin Continue with IVF's for now and follow UOP and daily Scr Renal US negative for  obstruction Serologies and complements ordered to r/o acute GN or vasculitis. Transferred to ICU yesterday for CVVHD due to worsening renal failure, hyperkalemia, and metabolic acidosis with respiratory distress. CVVHD : all dialysate 4K/2.5Ca, pre-filter 300 ml/hr, post-filter 200 ml/hr, dialysate 1500 ml/hr.  UF goal to keep event, anticoagulation heparin fixed dose in system.  Anion gap metabolic acidosis - pt had been taking metformin with elevated lactate level as well as AKI.  Daughter denies any other ingestion as a source.  Stop metformin and agree with starting isotonic bicarb drip.  Daughter also concerned about her mother eating expired [employer] peanut butter which had been recalled for concern of salmonella, however she has not had any diarrhea prior to admission. Worsening lactic acidosis/metabolic acidosis despite IV bicarb. Ordered CT scan of abd and pelvis which was negative for perforation or ischemic bowel No history of ingestion or metformin overdose. Hyperkalemia - due to #1 and #2. Improved with initiation of CRRT. 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  Worsening AMS likely multifactorial delirium due to hyponatremia, hypoglycemia, AKI, and possible recurrent UTI.  Will continue to follow.  Recurrent UTI's with recent course of amoxicillin.  Repeat urine culture sent today. DM type 2 - with hypoglycemia.  Improved after amp of D10 but dropped again and started on D5W with isotonic bicarb at 125 ml/hr with improved readings.  Continue to hold glipizide, metformin, and  tresiba for now and follow.  Likely due to poor po intake. HTN - bp elevated.  Hold ramipril and continue with amlodipine 5 mg daily. Cavitary pulmonary lesions - negative workup thus far.  Will send anti-GBM and ANCA. Disposition - pt has had a significant decline in clinical condition and will need transfer to ICU and likely initiation of CVVHD.  Need CT scan of abd given worsening acidosis, hyperkalemia, and renal failure.  Family wishes to be aggressive at this time and are in agreement.   Donetta Potts, MD [employer] [phone]

## 2021-07-23 NOTE — Plan of Care (Signed)
  Problem: Pain Managment: Goal: General experience of comfort will improve 07/23/2021 0045 by Tarri Fuller, RN Outcome: Progressing 07/23/2021 0044 by Tarri Fuller, RN Outcome: Progressing   Problem: Safety: Goal: Ability to remain free from injury will improve 07/23/2021 0045 by Tarri Fuller, RN Outcome: Progressing 07/23/2021 0044 by Tarri Fuller, RN Outcome: Progressing   Problem: Skin Integrity: Goal: Risk for impaired skin integrity will decrease Outcome: Progressing

## 2021-07-23 NOTE — Progress Notes (Signed)
Daughter Traci approached RN and stated that the decision had been made not to proceed with cortrak insertion tomorrow if patient unable to take PO.

## 2021-07-24 DIAGNOSIS — Z515 Encounter for palliative care: Secondary | ICD-10-CM | POA: Diagnosis not present

## 2021-07-24 DIAGNOSIS — Z66 Do not resuscitate: Secondary | ICD-10-CM

## 2021-07-24 DIAGNOSIS — N179 Acute kidney failure, unspecified: Secondary | ICD-10-CM | POA: Diagnosis not present

## 2021-07-24 DIAGNOSIS — Z87440 Personal history of urinary (tract) infections: Secondary | ICD-10-CM | POA: Diagnosis not present

## 2021-07-24 DIAGNOSIS — R627 Adult failure to thrive: Secondary | ICD-10-CM | POA: Diagnosis not present

## 2021-07-24 DIAGNOSIS — G9341 Metabolic encephalopathy: Secondary | ICD-10-CM | POA: Diagnosis not present

## 2021-07-24 LAB — GLUCOSE, CAPILLARY
Glucose-Capillary: 108 mg/dL — ABNORMAL HIGH (ref 70–99)
Glucose-Capillary: 132 mg/dL — ABNORMAL HIGH (ref 70–99)
Glucose-Capillary: 176 mg/dL — ABNORMAL HIGH (ref 70–99)
Glucose-Capillary: 147 mg/dL — ABNORMAL HIGH (ref 70–99)
Glucose-Capillary: 138 mg/dL — ABNORMAL HIGH (ref 70–99)

## 2021-07-24 LAB — ANCA TITERS
Atypical P-ANCA titer: <1:20 {titer}
C-ANCA: <1:20 {titer}
P-ANCA: <1:20 {titer}

## 2021-07-24 LAB — PROTEIN ELECTROPHORESIS, SERUM
A/G Ratio: 1.2 (ref 0.7–1.7)
Albumin ELP: 3.1 g/dL (ref 2.9–4.4)
Alpha-1-Globulin: 0.2 g/dL (ref 0.0–0.4)
Alpha-2-Globulin: 0.9 g/dL (ref 0.4–1.0)
Beta Globulin: 0.8 g/dL (ref 0.7–1.3)
Gamma Globulin: 0.6 g/dL (ref 0.4–1.8)
Globulin, Total: 2.6 g/dL (ref 2.2–3.9)
Total Protein ELP: 5.7 g/dL — ABNORMAL LOW (ref 6.0–8.5)

## 2021-07-24 LAB — URINE CULTURE: Culture: NO GROWTH

## 2021-07-24 LAB — COMPREHENSIVE METABOLIC PANEL
ALT: 20 U/L (ref 0–44)
AST: 40 U/L (ref 15–41)
Albumin: 3 g/dL — ABNORMAL LOW (ref 3.5–5.0)
Alkaline Phosphatase: 70 U/L (ref 38–126)
Anion gap: 7 (ref 5–15)
BUN: 9 mg/dL (ref 8–23)
CO2: 25 mmol/L (ref 22–32)
Calcium: 7.7 mg/dL — ABNORMAL LOW (ref 8.9–10.3)
Chloride: 101 mmol/L (ref 98–111)
Creatinine, Ser: 1.81 mg/dL — ABNORMAL HIGH (ref 0.44–1.00)
GFR, Estimated: 28 mL/min — ABNORMAL LOW (ref >60)
Glucose, Bld: 123 mg/dL — ABNORMAL HIGH (ref 70–99)
Potassium: 4.6 mmol/L (ref 3.5–5.1)
Sodium: 133 mmol/L — ABNORMAL LOW (ref 135–145)
Total Bilirubin: 0.5 mg/dL (ref 0.3–1.2)
Total Protein: 5.5 g/dL — ABNORMAL LOW (ref 6.5–8.1)

## 2021-07-24 LAB — CBC WITH DIFFERENTIAL/PLATELET
Abs Immature Granulocytes: 0.1 10*3/uL — ABNORMAL HIGH (ref 0.00–0.07)
Basophils Absolute: 0 10*3/uL (ref 0.0–0.1)
Basophils Relative: 0 %
Eosinophils Absolute: 0 10*3/uL (ref 0.0–0.5)
Eosinophils Relative: 0 %
HCT: 23.8 % — ABNORMAL LOW (ref 36.0–46.0)
Hemoglobin: 8 g/dL — ABNORMAL LOW (ref 12.0–15.0)
Immature Granulocytes: 1 %
Lymphocytes Relative: 17 %
Lymphs Abs: 2.8 10*3/uL (ref 0.7–4.0)
MCH: 31.5 pg (ref 26.0–34.0)
MCHC: 33.6 g/dL (ref 30.0–36.0)
MCV: 93.7 fL (ref 80.0–100.0)
Monocytes Absolute: 0.9 10*3/uL (ref 0.1–1.0)
Monocytes Relative: 5 %
Neutro Abs: 13.3 10*3/uL — ABNORMAL HIGH (ref 1.7–7.7)
Neutrophils Relative %: 77 %
Platelets: 123 10*3/uL — ABNORMAL LOW (ref 150–400)
RBC: 2.54 MIL/uL — ABNORMAL LOW (ref 3.87–5.11)
RDW: 14.5 % (ref 11.5–15.5)
WBC: 17.2 10*3/uL — ABNORMAL HIGH (ref 4.0–10.5)
nRBC: 0 % (ref 0.0–0.2)

## 2021-07-24 LAB — MAGNESIUM: Magnesium: 2.2 mg/dL (ref 1.7–2.4)

## 2021-07-24 LAB — RENAL FUNCTION PANEL
Albumin: 3 g/dL — ABNORMAL LOW (ref 3.5–5.0)
Anion gap: 7 (ref 5–15)
BUN: 8 mg/dL (ref 8–23)
CO2: 26 mmol/L (ref 22–32)
Calcium: 7.8 mg/dL — ABNORMAL LOW (ref 8.9–10.3)
Chloride: 103 mmol/L (ref 98–111)
Creatinine, Ser: 1.69 mg/dL — ABNORMAL HIGH (ref 0.44–1.00)
GFR, Estimated: 31 mL/min — ABNORMAL LOW (ref >60)
Glucose, Bld: 120 mg/dL — ABNORMAL HIGH (ref 70–99)
Phosphorus: 3.4 mg/dL (ref 2.5–4.6)
Potassium: 4.7 mmol/L (ref 3.5–5.1)
Sodium: 136 mmol/L (ref 135–145)

## 2021-07-24 LAB — PROCALCITONIN: Procalcitonin: 0.46 ng/mL

## 2021-07-24 LAB — GLOMERULAR BASEMENT MEMBRANE ANTIBODIES: GBM Ab: <0.2 units (ref 0.0–0.9)

## 2021-07-24 LAB — ANTI-DNA ANTIBODY, DOUBLE-STRANDED: ds DNA Ab: <1 IU/mL (ref 0–9)

## 2021-07-24 LAB — IMMUNOFIXATION, URINE

## 2021-07-24 NOTE — Progress Notes (Signed)
Nephrology Follow-Up Consult note   Assessment/Recommendations: Beth [name] is a/an 80 y.o. female with a past medical history significant for colon cancer s/p treatment, sarcoidosis, cavitary lung lesion, HTN, DM2, Alzheimer's, admitted for AMS and AKI.     Acute Renal Failure: minimal CKD at BL w/ Crt 1.3.  Likely ATN 2/2 sepsis. Oliguric UOP. Stable on CRRT but overall declining. Planning for comfort care per family. Continue CRRT until family able to say goodbye then stop.  Anion gap metabolic acidosis/hyperkalemia: Both improved with renal replacement therapy  AMS: Multifactorial with Alzheimer's and metabolic encephalopathy contributing.  Management per primary  DM2: Management per primary  Hypertension: Holding home ACE inhibitor.  On amlodipine  GOC: Likely transition to comfort care.  Appreciate help from multiple teams   Recommendations conveyed to primary service.    [facility] 07/24/2021 10:52 AM  ___________________________________________________________  CC: Acute renal failure  Interval History/Subjective: Decision made to transition to more comfort approach after family has been able to supervise her.  Patient appears comfortable today.  Was confused overnight.   Medications:  Current Facility-Administered Medications  Medication Dose Route Frequency Provider Last Rate Last Admin    prismasol BGK 4/2.5 infusion   CRRT Continuous Justin Mend, MD 200 mL/hr at 07/23/21 0530 New Bag at 07/23/21 0530    [place] 4/2.5 infusion   CRRT Continuous Justin Mend, MD 300 mL/hr at 07/23/21 1735 New Bag at 07/23/21 1735   0.9 %  sodium chloride infusion   Intravenous Continuous Fuller Plan A, MD 100 mL/hr at 07/24/21 1000 Infusion Verify at 07/24/21 1000   acetaminophen (TYLENOL) tablet 650 mg  650 mg Oral Q6H PRN Norval Morton, MD       Or   acetaminophen (TYLENOL) suppository 650 mg  650 mg Rectal Q6H PRN Fuller Plan A, MD       albuterol (PROVENTIL) (2.5 MG/3ML) 0.083% nebulizer solution 2.5 mg  2.5 mg Nebulization Q6H PRN Tamala Julian, Rondell A, MD       amLODipine (NORVASC) tablet 5 mg  5 mg Oral Daily Smith, Rondell A, MD       Ampicillin-Sulbactam (UNASYN) 3 g in sodium chloride 0.9 % 100 mL IVPB  3 g Intravenous Q8H Julian Hy, DO   Stopped at 07/24/21 6962   Chlorhexidine Gluconate Cloth 2 % PADS 6 each  6 each Topical Daily Nita Sells, MD   6 each at 07/23/21 1018   dextrose 10 % infusion   Intravenous Continuous Gerald Leitz D, NP 20 mL/hr at 07/24/21 1000 Infusion Verify at 07/24/21 1000   famotidine (PEPCID) IVPB 20 mg premix  20 mg Intravenous Once Fuller Plan A, MD       famotidine (PEPCID) tablet 20 mg  20 mg Oral QHS Smith, Rondell A, MD       heparin 10,000 units/ 20 mL infusion syringe  350-750 Units/hr CRRT Continuous Donato Heinz, MD 0.7 mL/hr at 07/23/21 1738 350 Units/hr at 07/23/21 1738   heparin injection 1,000-6,000 Units  1,000-6,000 Units CRRT PRN Donato Heinz, MD   2,400 Units at 07/22/21 1253   heparinized saline (2000 units/L) primer fluid for CRRT   CRRT PRN Donato Heinz, MD       hydrALAZINE (APRESOLINE) injection 10 mg  10 mg Intravenous Q4H PRN Fuller Plan A, MD   10 mg at 07/24/21 0920   insulin aspart (novoLOG) injection 0-6 Units  0-6 Units Subcutaneous TID WC Norval Morton, MD  3 Units at 07/22/21 1608   lip balm (CARMEX) ointment   Topical PRN Gerald Leitz D, NP       LORazepam (ATIVAN) injection 0.5 mg  0.5 mg Intravenous Q6H PRN Fuller Plan A, MD   0.5 mg at 07/22/21 0426   norepinephrine (LEVOPHED) 4mg  in 220mL premix infusion  0-40 mcg/min Intravenous Titrated Juanito Doom, MD   Stopped at 07/23/21 0546   nystatin (MYCOSTATIN) 100000 UNIT/ML suspension 500,000 Units  5 mL Oral QID Fuller Plan A, MD   500,000 Units at 07/24/21 0920   ondansetron (ZOFRAN) tablet 4 mg  4 mg Oral Q6H PRN Norval Morton, MD        Or   ondansetron [facility]) injection 4 mg  4 mg Intravenous Q6H PRN Fuller Plan A, MD   4 mg at 07/23/21 1430   prismasol BGK 4/2.5 infusion   CRRT Continuous Justin Mend, MD 1,500 mL/hr at 07/24/21 0418 New Bag at 07/24/21 0418   sodium bicarbonate 150 mEq in dextrose 5 % 1,150 mL infusion   Intravenous Continuous Donato Heinz, MD   Stopped at 07/23/21 0509   sodium chloride 0.9 % bolus 1,000 mL  1,000 mL Intravenous Once Fuller Plan A, MD       sodium chloride flush (NS) 0.9 % injection 3 mL  3 mL Intravenous Q12H Fuller Plan A, MD   3 mL at 07/23/21 2107      Review of Systems: Unable to obtain due to the patient's AMS  Physical Exam: Vitals:   07/24/21 0800 07/24/21 0900  BP: (!) 125/100 (!) 174/64  Pulse: 100   Resp: (!) 24 16  Temp: 100.1 F (37.8 C)   SpO2: 99% 99%   Total I/O In: 459 [I.V.:359; IV Piggyback:100] Out: 687 [Urine:75; Other:612]  Intake/Output Summary (Last 24 hours) at 07/24/2021 1052 Last data filed at 07/24/2021 1000 Gross per 24 hour  Intake 3691.66 ml  Output 4819 ml  Net -1127.34 ml   Constitutional: Lying in bed, no distress ENMT: ears and nose without scars or lesions, MMM CV: normal rate Respiratory: Bilateral chest rise, normal work of breathing Gastrointestinal: Obvious distention Skin: no visible lesions or rashes Psych: Tired, does not answer questions   Test Results I personally reviewed new and old clinical labs and radiology tests Lab Results  Component Value Date   NA 136 07/24/2021   K 4.7 07/24/2021   CL 103 07/24/2021   CO2 26 07/24/2021   BUN 8 07/24/2021   CREATININE 1.69 (H) 07/24/2021   GFR 67.66 07/18/2015   GLU 109 01/28/2015   CALCIUM 7.8 (L) 07/24/2021   ALBUMIN 3.0 (L) 07/24/2021   PHOS 3.4 07/24/2021

## 2021-07-24 NOTE — Progress Notes (Signed)
NAME:  Beth [name], MRN:  [id number], DOB:  02-20-1941, LOS: 3 ADMISSION DATE:  07/21/2021, CONSULTATION DATE: 10/29 REFERRING MD:  Dr. 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  Upon EMS arrival patient was seen significantly hypoglycemic with a blood sugar of 51.  Per family patient has had persistent medical and cognitive decline since July of this year after being diagnosed with urinary tract infection.  She reports multiple primary care visits and specialty visits to determine cause of decline.  Reports she has been diagnosed with approximately 4 urinary tract infections all treated with antibiotic therapy since July.  Most recently patient completed 10-day course of amoxicillin after cultures resulted with Enterococcus 10/12.  Daughter additionally reports poor oral intake for several weeks with multiple falls and worsening deconditioning.  Upon ED arrival patient was seen afebrile mildly hypertensive lab work significant for multiple metabolic derangements including severe AKI, positive lactic acidosis, hyponatremia, anion gap, and leukocytosis.  Midmorning of 10/29 patient seen with further decline with likely need of emergent HD due to progressive renal failure.  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, cavitary lung lesion status post bronchoscopy with biopsy negative for malignancy, type 2 diabetes, hypertension,  hyperlipidemia, arthritis, and recent diagnosis of Alzheimer's disease  Significant Hospital Events: Including procedures, antibiotic start and stop dates in addition to other pertinent events   10/28 admitted after being found unresponsive by spouse, hypoglycemic with severe AKI on admission 10/29 nephrology consulted admitted to ICU hemodialysis 10/30 tolerated HD overnight, hgb did drop to 7.9 this AM  10/30 CT ab/pelv > no acute abnormality but motion degraded study 10/31 family hopeful for palliative meeting. Daughter has a pink MOST form which indicates DNR code status.   Interim History / Subjective:   WBC stable at 17, PCT 0.46 Cr 1.7  Patient is currently on CRRT. Daughter is at bedside    Objective   Blood pressure (!) 174/64, pulse 100, temperature 100.1 F (37.8 C), temperature source Axillary, resp. rate 16, height _0  (1.575 m), weight 67.2 kg, SpO2 99 %.        Intake/Output Summary (Last 24 hours) at 07/24/2021 0952 Last data filed at 07/24/2021 0900 Gross per 24 hour  Intake 3682.08 ml  Output 4508 ml  Net -825.92 ml   Filed Weights   07/21/21 1258  Weight: 67.2 kg    Examination: General: Chronically and acutely ill appearing elderly F reclined in bed on CRRT NAD  HEENT: NCAT. Pink mm anicteric sclera  Neuro: Awake, alert. Does not follow commands. Not oriented. Garbled speech  PULM: Upper lobe rhonchi. Symmetrical chest expansion, even unlabored  CV: rr s1s2 cap refill < 3 sec  GI: soft ndnt  GU: normal anatomy  MSK: Bilateral wrist restraints  Skin: pale, c/d/w    Resolved Hospital Problem list   Hyponatremia Hyperkalemia   Assessment & Plan:   Goals of Care DNR status Encounter for palliative care Daughter tracie shares that family has been discussing goals of care and is strongly leaning toward  compassionate shift in care toward comfort care. She compassionately indices the pts dignity and comfort during transition to EOL care is of the  upmost importance.  Family hopes for formal meeting with palliative care do discuss In interim, no escalation of care.  Reviewed MOST form which indicates DNR status with limited tx trial of reversible causes-- I will update code status  Can continue dextrose gtt for now but no cortrak placement  Family would like to stop CRRT today. I will let nephro know, then dc HD cath and wrist restraints    Acute metabolic encephalopathy superimposed on likely Alzheimer's dementia P Continues on Dextrose gtt and CRRt for now while GOC are determined   AKI on CKD IIIa  -Likely multifactorial including poor oral intake, medication side effect, and possible pyelonephritis  P: Will dc CRRT 10/31   Suspected Sepsis Possible aspiration PNA  Had physiology concerning for sepsis on 10/27-10/28, but there is no clear evidence of infection P: Monitor cx data Cont Unasyn   HTN  Transient hypotension with CRRT initiation  -Home medications include Amlodipine and Ramipril  P: Holding home antihypertensives  FTT Poor PO intake  Malnutrition Generalized weakness -Family reports progressive functional decline over the last 6-8 months. Patient has been falling at home secondary to weakness and poor ambulation. Low albumin low protein  P: Family decided against cortrak if pt unable to take POs   DM2 with hypoglycemia  -Home medications include Glipizide, and Metformin  P: Hold insulin and home meds Cont D10 gtt   Hx of cavitary lung lesion -Tissue biopsies were negative for malignancy there was atypical cells noted on one slide however favored to be reactive bronchial epithelial cells P: If aggressive care continued, outpt follow up   Best Practice (right click and "Reselect all SmartList Selections" daily)   Diet/type: NPO DVT prophylaxis: prophylactic heparin  GI prophylaxis: PPI Lines: Dialysis Catheter Foley:  Yes, and it is still needed Code Status:  DNR Last date of  multidisciplinary goals of care discussion: 10/31 met with daughter at bedside. She indicates family is leaning toward compassionate transition to hospice care with de-escalation of aggressive interventions. They would like to meet with palliative care as a family to discuss next steps before making official decision   CRITICAL CARE Performed by: Cristal Generous   Total critical care time: 48 minutes  Critical care time was exclusive of separately billable procedures and treating other patients. Critical care was necessary to treat or prevent imminent or life-threatening deterioration.  Critical care was time spent personally by me on the following activities: development of treatment plan with patient and/or surrogate as well as nursing, discussions with consultants, evaluation of patient's response to treatment, examination of patient, obtaining history from patient or surrogate, ordering and performing treatments and interventions, ordering and review of laboratory studies, ordering and review of radiographic studies, pulse oximetry and re-evaluation of patient's condition.  Eliseo Gum MSN, AGACNP-BC [place] for pager  07/24/2021, 9:52 AM

## 2021-07-24 NOTE — Plan of Care (Signed)

## 2021-07-24 NOTE — Consult Note (Signed)
Consultation Note Date: 07/24/2021   Patient Name: Beth [name]  DOB: [birth year]  MRN: [id number]  Age / Sex: 80 y.o., female  PCP: [facility] [place] [facility] [place] Referring Physician: Julian Hy, DO  Reason for Consultation: Establish GOC  HPI/Patient Profile: 80 y.o. 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  Upon EMS arrival patient was seen significantly hypoglycemic with a blood sugar of 51.   Per family patient has had persistent medical and cognitive decline since July of this year after being diagnosed with urinary tract infection.  She reports multiple primary care visits and specialty visits to determine cause of decline.  Reports she has been diagnosed with approximately 4 urinary tract infections all treated with antibiotic therapy since July.  Most recently patient completed 10-day course of amoxicillin after cultures resulted with Enterococcus 10/12.  Daughter additionally reports poor oral intake for several weeks with multiple falls and worsening deconditioning.   Upon ED arrival patient was seen afebrile mildly hypertensive lab work significant for multiple metabolic derangements including severe AKI, positive lactic acidosis, hyponatremia, anion gap, and leukocytosis.  Midmorning of 10/29 patient seen with further decline with likely need of emergent HD due to progressive renal failure.  PCCM consulted   Ongoing  conversation with CCM regarding current medical situation and treatment plan.     Family face treatment option decisions, advanced directive decisions and anticipatory care needs.   Clinical Assessment and Goals of Care:  This NP Wadie Lessen reviewed medical records, received report from team, and then spoke to the  patient's daughte/ Teresa Pelton by telephone   to discuss diagnosis, prognosis, GOC, EOL wishes disposition and options.   Concept of Palliative Care was introduced as specialized medical care for people and their families living with serious illness.  If focuses on providing relief from the symptoms and stress of a serious illness.  The goal is to improve quality of life for both the patient and the family.   Values and goals of care important to patient and family were attempted to be elicited.  Created space and opportunity for daughter to explore thoughts and feelings regarding current medical situation.  She and her father/main care giver understand the seriousness of the current situation and the associated poor prognosis.  The ultimate goal is comfort and dignity for Beth [name].    Beth [name] shared her deep love and respect for her mother, she speaks to her being the Runell Gess of the family.  Patient and her husband have married for 36 years, she shares how hard this is on her father.    There are 2 sons who will  be present for follow-up meeting tomorrow morning     Education offered today regarding advanced directives.  Concepts specific to code status, artifical feeding and hydration, continued IV antibiotics and rehospitalization were explored.  The difference between a aggressive medical intervention path  and a palliative comfort care path for this patient at this time was had.      Natural trajectory and expectations at EOL were discussed.  Questions and concerns addressed.  Patient  encouraged to call with questions or concerns.   Explored with Beth [name] possible transition of  care options and she voices an interest in residential hospice.   PMT will continue to support holistically.           Patient's spouse is next of kin he and his daughter were closely in medical decision making for this patient.      SUMMARY OF RECOMMENDATIONS    Code Status/Advance Care Planning: DNR  No further dialysis after today No escalation of care   Symptom Management:  Per attending   Palliative Prophylaxis:  Eye Care, Frequent Pain Assessment, and Oral Care   Psycho-social/Spiritual:  Desire for further Chaplaincy support:no-declined Additional Recommendations: Education on Hospice  Prognosis:  < 2 weeks  Discharge Planning: To Be Determined      Primary Diagnoses: Present on Admission:  SIRS (systemic inflammatory response syndrome) (HCC)  Type 2 diabetes mellitus with hypoglycemia without coma (HCC)  Hyperkalemia  Thrush  Acute metabolic encephalopathy  Lactic acidosis  History of UTI  Acute renal failure superimposed on chronic kidney disease (HCC)  Falls  Normocytic anemia   I have reviewed the medical record, interviewed the patient and family, and examined the patient. The following aspects are pertinent.  Past Medical History:  Diagnosis Date   Arthritis    neck and all over   Cancer ([place]) 1998   hx colon ca-chemo radiation; lung; denies lung involvement    Chronic back pain    Diabetes mellitus without complication (HCC)    type 2   History of Raynaud's syndrome    Hyperlipemia    Hypertension    Psoriasis    afflicts bed of fingernails    Sarcoidosis    of the eye; 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 ; reports hasnt had any affliction in years    Wears glasses    Wears partial dentures    top and bottom partials   Social History   Socioeconomic History   Marital status: Married    Spouse name: Not on file   Number of children: Not on file   Years of education: Not on file   Highest education level: Not on file  Occupational  History   Not on file  Tobacco Use   Smoking status: Former    Packs/day: 0.50    Types: Cigarettes    Quit date: 07/22/1971    Years since quitting: 50.0   Smokeless tobacco: Never  Vaping Use   Vaping Use: Never used  Substance and Sexual Activity   Alcohol use: No    Alcohol/week: 0.0 standard drinks   Drug use: No   Sexual activity: Yes    Birth control/protection: Surgical    Comment: Hysterectomy  Other Topics Concern   Not on file  Social History Narrative   Married   3 children   Social Determinants of Health   Financial Resource Strain: Not on file  Food Insecurity: Not on file  Transportation Needs: Not on file  Physical Activity: Not on file  Stress: Not on file  Social Connections: Not on file   Family History  Problem Relation Age of Onset   Cancer Mother        colon    Hypertension Father    CVA Father    Obesity Brother    Scheduled Meds:  amLODipine  5 mg Oral Daily   Chlorhexidine Gluconate Cloth  6 each Topical Daily   famotidine  20 mg Oral QHS   insulin aspart  0-6 Units Subcutaneous TID WC   nystatin  5 mL Oral QID   sodium chloride flush  3 mL Intravenous Q12H   Continuous Infusions:   prismasol BGK 4/2.5 200 mL/hr at 07/23/21 0530    prismasol BGK 4/2.5 300 mL/hr at 07/23/21 1735   sodium chloride 100 mL/hr at 07/24/21 0800   ampicillin-sulbactam (UNASYN) IV Stopped (07/24/21 0731)   dextrose 20 mL/hr at 07/24/21 0800   famotidine (PEPCID) IV     heparin 10,000 units/ 20 mL infusion syringe 350 Units/hr (07/23/21 1738)   norepinephrine (LEVOPHED) Adult infusion Stopped (07/23/21 0546)   prismasol BGK 4/2.5 1,500 mL/hr at 07/24/21 0418   sodium bicarbonate 150 mEq in D5W infusion Stopped (07/23/21 0509)   sodium chloride     PRN Meds:.acetaminophen **OR** acetaminophen, albuterol, heparin, heparin, hydrALAZINE, lip balm, LORazepam, ondansetron **OR** ondansetron (ZOFRAN) IV Medications Prior to Admission:  Prior to Admission  medications   Medication Sig Start Date End Date Taking? Authorizing Provider  acetaminophen (TYLENOL) 325 MG tablet Take 325-650 mg by mouth 2 (two) times daily as needed for moderate pain.    Yes [provider]  amLODipine (NORVASC) 5 MG tablet Take 5 mg by mouth daily.   Yes [provider]  AZO-CRANBERRY PO Take 1 tablet by mouth daily.   Yes [provider]  D-MANNOSE PO Take 1,300 mg by mouth daily.   Yes [provider]  famotidine (PEPCID) 20 MG tablet Take 20 mg by mouth 2 (two) times daily.   Yes [provider]  Flavoring Agent (MARSHMALLOW ARTIFICIAL FLAVOR) CONC Take 1 capsule by mouth daily.   Yes [provider]  glipiZIDE (GLUCOTROL XL) 10 MG 24 hr tablet TAKE 2 TABLETs in am and 1 tablet before dinner Patient taking differently: Take 10-20 mg by mouth in the morning and at bedtime. Take 28m in the morning and 175mat night. 01/31/21  Yes GhPhilemon KingdomMD  insulin degludec (TRESIBA FLEXTOUCH) 100 UNIT/ML FlexTouch Pen INJECT 15 UNITS SUBCUTANEOUSLY DAILY Patient taking differently: Inject 15 Units into the skin at bedtime as needed (high blood sugar levels). 01/31/21  Yes GhPhilemon KingdomMD  metFORMIN (GLUCOPHAGE) 1000 MG tablet TAKE 1 TABLET TWICE DAILY WITH MEALS Patient taking differently: Take 1,000 mg by mouth 2 (two) times daily with a meal. 01/31/21  Yes GhPhilemon KingdomMD  Multiple Vitamin (MULTIVITAMIN) tablet Take 1 tablet by mouth daily.   Yes [provider]  ramipril (ALTACE) 10 MG capsule Take 10 mg by mouth daily.   Yes [provider]  Blood Glucose Monitoring Suppl (ONETOUCH VERIO) w/Device KIT Use as instructed to check blood sugar 4 times daily 05/25/21   GhPhilemon KingdomMD  DROPLET PEN NEEDLES 32G X 5 MM MISC USE TO INJECT INSULIN ONE TIME DAILY AS DIRECTED (NEED APPOINTMENT FOR FURTHER REFILLS) 04/10/21   GhPhilemon KingdomMD  glucose blood (ONETOUCH VERIO) test strip Use as  instructed to check blood sugar 4 times daily 05/25/21   GhPhilemon KingdomMD  Lancets (  ONETOUCH ULTRASOFT) lancets Use as instructed to check blood sugar 4 times daily 05/25/21   Philemon Kingdom, MD   No Known Allergies Review of Systems  Physical Exam  Vital Signs: BP (!) 125/100 (BP Location: Left Arm)   Pulse 100   Temp 100.1 F (37.8 C) (Axillary)   Resp (!) 24   Ht 5' 2" (1.575 m)   Wt 67.2 kg   SpO2 99%   BMI 27.10 kg/m  Pain Scale: CPOT   Pain Score: Asleep   SpO2: SpO2: 99 % O2 Device:SpO2: 99 % O2 Flow Rate: .O2 Flow Rate (L/min): 2 L/min  IO: Intake/output summary:  Intake/Output Summary (Last 24 hours) at 07/24/2021 0630 Last data filed at 07/24/2021 0800 Gross per 24 hour  Intake 3562.18 ml  Output 4488 ml  Net -925.82 ml    LBM: Last BM Date: 07/23/21 Baseline Weight: Weight: 67.2 kg Most recent weight: Weight: 67.2 kg     Palliative Assessment/Data: 30 % at best   Discussed with bedside RN/Chris and Ruffin Frederick  CCM  Time In: 0830 Time Out: 0940 Time Total: 70 minutes Greater than 50%  of this time was spent counseling and coordinating care related to the above assessment and plan.  Signed by: Wadie Lessen, NP   Please contact Palliative Medicine Team phone at [phone] for questions and concerns.  For individual provider: See Shea Evans

## 2021-07-25 DIAGNOSIS — F02B0 Dementia in other diseases classified elsewhere, moderate, without behavioral disturbance, psychotic disturbance, mood disturbance, and anxiety: Secondary | ICD-10-CM | POA: Diagnosis not present

## 2021-07-25 DIAGNOSIS — G309 Alzheimer's disease, unspecified: Secondary | ICD-10-CM | POA: Diagnosis not present

## 2021-07-25 DIAGNOSIS — N179 Acute kidney failure, unspecified: Secondary | ICD-10-CM | POA: Diagnosis not present

## 2021-07-25 DIAGNOSIS — Z515 Encounter for palliative care: Secondary | ICD-10-CM | POA: Diagnosis not present

## 2021-07-25 DIAGNOSIS — Z7189 Other specified counseling: Secondary | ICD-10-CM

## 2021-07-25 DIAGNOSIS — N1831 Chronic kidney disease, stage 3a: Secondary | ICD-10-CM | POA: Diagnosis not present

## 2021-07-25 DIAGNOSIS — Z66 Do not resuscitate: Secondary | ICD-10-CM | POA: Diagnosis not present

## 2021-07-25 LAB — GLUCOSE, CAPILLARY
Glucose-Capillary: 115 mg/dL — ABNORMAL HIGH (ref 70–99)
Glucose-Capillary: 126 mg/dL — ABNORMAL HIGH (ref 70–99)
Glucose-Capillary: 137 mg/dL — ABNORMAL HIGH (ref 70–99)
Glucose-Capillary: 141 mg/dL — ABNORMAL HIGH (ref 70–99)

## 2021-07-25 LAB — ANTINUCLEAR ANTIBODIES, IFA: ANA Ab, IFA: POSITIVE — AB

## 2021-07-25 LAB — COMPLEMENT, TOTAL: Compl, Total (CH50): >60 U/mL (ref >41)

## 2021-07-25 LAB — FANA STAINING PATTERNS: Speckled Pattern: 24529 — ABNORMAL HIGH

## 2021-07-25 MED ORDER — MORPHINE SULFATE 10 MG/5ML PO SOLN: 5.0000 mg | ORAL | Status: DC | PRN

## 2021-07-25 MED ORDER — MORPHINE SULFATE (CONCENTRATE) 10 MG/0.5ML PO SOLN
5.0000 mg | ORAL | Status: DC | PRN
  Filled 2021-07-25 (×2): qty 0.5

## 2021-07-25 MED ORDER — MORPHINE SULFATE (CONCENTRATE) 10 MG/0.5ML PO SOLN
5.0000 mg | ORAL | Status: DC | PRN
Start: 2021-07-25 — End: 2021-07-25

## 2021-07-25 NOTE — Progress Notes (Signed)
Daily Progress Note   Patient Name: Beth [name]       Date: 07/25/2021 DOB: 05-16-1941  Age: 80 y.o. MRN#: [id number] Attending Physician: Kipp Brood, MD Primary Care Physician: [facility] [place] Admit Date: 07/21/2021  Reason for Consultation/Follow-up: Establishing goals of care and Psychosocial/spiritual support      Todays discussion :  Met today as scheduled with patient's family to include her husband, 2 sons, daughter, sister-in-law and niece for continued conversation regarding current medical situation; diagnosis, prognosis, goals of care, end-of-life wishes, disposition and options.  Family understand the seriousness of the current medical situation as detailed by CCM and the associated poor prognosis.  Ultimately her main focus of care is comfort, quality and dignity for Beth [name].  CRRT discontinued yesterday, no further life prolonging measures,   allowing for natural death.  Created space and opportunity for all to explore thoughts and feelings regarding current medical situation.  All verbalize their love for Beth [name].  All support a comfort path     Length of Stay: 4  Current Medications: Scheduled Meds:   amLODipine  5 mg Oral Daily   Chlorhexidine Gluconate Cloth  6 each Topical Daily   famotidine  20 mg Oral QHS   insulin aspart  0-6 Units Subcutaneous TID WC   nystatin  5 mL Oral QID   sodium chloride flush  3 mL Intravenous Q12H    Continuous Infusions:   prismasol BGK 4/2.5 200 mL/hr at 07/23/21 0530   sodium chloride 100 mL/hr at 07/25/21 0600   ampicillin-sulbactam (UNASYN) IV 3 g (07/25/21 0754)   dextrose 20 mL/hr at 07/25/21 0600   famotidine (PEPCID) IV     sodium bicarbonate 150 mEq in D5W infusion Stopped  (07/23/21 0509)   sodium chloride      PRN Meds: acetaminophen **OR** acetaminophen, albuterol, hydrALAZINE, lip balm, LORazepam, ondansetron **OR** ondansetron (ZOFRAN) IV  Physical Exam Constitutional:      Appearance: She is normal weight. She is ill-appearing.  Cardiovascular:     Rate and Rhythm: Tachycardia present.  Pulmonary:     Breath sounds: Decreased breath sounds present.  Skin:    General: Skin is warm and dry.  Neurological:     Mental Status: She is lethargic.  Vital Signs: BP (!) 143/65   Pulse (!) 108   Temp 98.6 F (37 C) (Oral)   Resp (!) 26   Ht $R'5\' 2"'oI$  (1.575 m)   Wt 69.5 kg   SpO2 96%   BMI 28.02 kg/m  SpO2: SpO2: 96 % O2 Device: O2 Device: Nasal Cannula O2 Flow Rate: O2 Flow Rate (L/min): 4 L/min  Intake/output summary:  Intake/Output Summary (Last 24 hours) at 07/25/2021 0831 Last data filed at 07/25/2021 0700 Gross per 24 hour  Intake 2854.79 ml  Output 1529 ml  Net 1325.79 ml   LBM: Last BM Date: 07/23/21 Baseline Weight: Weight: 67.2 kg Most recent weight: Weight: 69.5 kg       Palliative Assessment/Data: 30 % at best      Patient Active Problem List   Diagnosis Date Noted   SIRS (systemic inflammatory response syndrome) ([place]) 07/21/2021   Hyperkalemia 07/21/2021   Thrush 28/20/6015   Acute metabolic encephalopathy 61/53/7943   Lactic acidosis 07/21/2021   History of UTI 07/21/2021   Acute renal failure superimposed on chronic kidney disease ([place]) 07/21/2021   Falls 07/21/2021   Normocytic anemia 07/21/2021   Cavitary lesion of lung 06/15/2021   Multiple lung nodules on CT 05/16/2021   Colon cancer ([place]) 05/16/2021   Spinal stenosis, 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   Overweight (BMI 25.0-29.9) 04/15/2018   Essential hypertension 04/15/2018   Cervical spine pain 03/22/2018   Lumbar pain 03/22/2018   Mass 12/23/2017   Hyperlipidemia 06/14/2017   Nail deformity 07/30/2016   Type 2 diabetes  mellitus with hypoglycemia without coma ([place]) 04/13/2016    Palliative Care Assessment & Plan   Patient Profile: Beth [name] is a 80 year old female with past medical history significant for colon cancer status post chemo and radiation, 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  Upon EMS arrival patient was seen significantly hypoglycemic with a blood sugar of 51.   Per family patient has had persistent medical and cognitive decline since July of this year after being diagnosed with urinary tract infection.  She reports multiple primary care visits and specialty visits to determine cause of decline.  Reports she has been diagnosed with approximately 4 urinary tract infections all treated with antibiotic therapy since July.  Most recently patient completed 10-day course of amoxicillin after cultures resulted with Enterococcus 10/12.  Daughter additionally reports poor oral intake for several weeks with multiple falls and worsening deconditionin  Assessment: Family has made decision to shift to full comfort focusing on comfort and dignity allowing for natural death.   Prognosis is limited likely less than 2 weeks   Goals of Care and Additional Recommendations: Limitations on Scope of Treatment: Full Comfort Care  Code Status:    Code Status Orders  (From admission, onward)           Start     Ordered   07/24/21 0939  Do not attempt resuscitation (DNR)  Continuous       Question Answer Comment  In the event of cardiac or respiratory ARREST Do not call a "code blue"   In the event of cardiac or respiratory ARREST Do not perform Intubation, CPR, defibrillation or ACLS   In the event of cardiac or  respiratory ARREST Use medication by any route, position,  wound care, and other measures to relive pain and suffering. May use oxygen, suction and manual treatment of airway obstruction as needed for comfort.      07/24/21 5583           Code Status History     Date Active Date Inactive Code Status Order ID Comments User Context   07/22/2021 1039 07/24/2021 0938 Partial Code [id number]  Rico Ala, NP Inpatient   07/21/2021 1600 07/22/2021 1039 Full Code [id number]  Norval Morton, MD ED   05/09/2018 1748 05/10/2018 1323 Full Code [id number]  [name], MD Inpatient       Prognosis:  < 2 weeks  Discharge Planning: Hospice facility-family is hopeful for [facility] hosp[facility]  Care plan was discussed with bedside RN and CCM  Thank you for allowing the Palliative Medicine Team to assist in the care of this patient.   Time In: 0915 Time Out: 1015 Total Time 60 minutes Prolonged Time Billed  no       Greater than 50%  of this time was spent counseling and coordinating care related to the above assessment and plan.  Wadie Lessen, NP  Please contact Palliative Medicine Team phone at [phone] for questions and concerns.

## 2021-07-25 NOTE — Progress Notes (Signed)
NAME:  ANNLEE GLANDON, MRN:  [id number], DOB:  09/03/1941, LOS: 4 ADMISSION DATE:  07/21/2021, CONSULTATION DATE: 10/29 REFERRING MD:  Dr. 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  Upon EMS arrival patient was seen significantly hypoglycemic with a blood sugar of 51.  Per family patient has had persistent medical and cognitive decline since July of this year after being diagnosed with urinary tract infection.  She reports multiple primary care visits and specialty visits to determine cause of decline.  Reports she has been diagnosed with approximately 4 urinary tract infections all treated with antibiotic therapy since July.  Most recently patient completed 10-day course of amoxicillin after cultures resulted with Enterococcus 10/12.  Daughter additionally reports poor oral intake for several weeks with multiple falls and worsening deconditioning.  Upon ED arrival patient was seen afebrile mildly hypertensive lab work significant for multiple metabolic derangements including severe AKI, positive lactic acidosis, hyponatremia, anion gap, and leukocytosis.  Midmorning of 10/29 patient seen with further decline with likely need of emergent HD due to progressive renal failure.  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, cavitary lung lesion status post bronchoscopy with biopsy negative for malignancy, type 2 diabetes, hypertension,  hyperlipidemia, arthritis, and recent diagnosis of Alzheimer's disease  Significant Hospital Events: Including procedures, antibiotic start and stop dates in addition to other pertinent events   10/28 admitted after being found unresponsive by spouse, hypoglycemic with severe AKI on admission 10/29 nephrology consulted admitted to ICU hemodialysis 10/30 tolerated HD overnight, hgb did drop to 7.9 this AM  10/30 CT ab/pelv > no acute abnormality but motion degraded study 10/31 family hopeful for palliative meeting. Daughter has a pink MOST form which indicates DNR code status.  11/1 family meeting -- plan for residential hospice   Interim History / Subjective:   Off CRRT yesterday   This morning is talking about having some [employer] -- soup and salad + breadsticks   Objective   Blood pressure (!) 143/65, pulse (!) 108, temperature 99.6 F (37.6 C), temperature source Oral, resp. rate (!) 26, height $RemoveBe'5\' 2"'FISjmuKxw$  (1.575 m), weight 69.5 kg, SpO2 96 %.        Intake/Output Summary (Last 24 hours) at 07/25/2021 0717 Last data filed at 07/25/2021 0700 Gross per 24 hour  Intake 3074.58 ml  Output 1739 ml  Net 1335.58 ml   Filed Weights   07/21/21 1258 07/25/21 0600  Weight: 67.2 kg 69.5 kg    Examination: General: elderly F reclined in bed nad  HEENT: NCAT pink mmm anicteric sclera  Neuro: Awake alert, intermittently oriented x 2 PULM: Upper love rhonchi  CV: rrr s1s2  GI: soft ndnt + bowel sounds  GU: foley  MSK: no acute deformity no cyanosis or clubbing  Skin: pale c/d/w   Resolved Hospital Problem list   Hyponatremia Hyperkalemia   Assessment & Plan:   Goals of Care Encounter for Palliative Care DNR Status Acute encephalopathy superimposed on Alzheimer's dementia AKI on CKD IIIa Possible sepsis, suspected aspiration  HTN DM2  with hypoglycemia Poor PO intake FTT  Malnutrition  Hx cavitary lung lesion Met with pts family (husband, 2 sons, daughter in law, grand  daughter and daughter) and [facility] with palliative care for interdisciplinary [place] meeting 11/1.   Plan will be to focus on comfort, quality of life and dignity -- with transition away from aggressive medical care and shift toward comfort care  P Compassionate plan for placement in residential hospice Will write for transfer to palliative care floor, comfort care in interim  Pleasure feeds. Ok to continue low rate dextrose gtt in interim  No labs, imaging  Dc interventions not aimed at comfort  No follow up for lung lesion  Has ad 5d unasyn -- will dc     Best Practice (right click and "Reselect all SmartList Selections" daily)   Diet/type: Regular consistency (see orders) DVT prophylaxis: not indicated GI prophylaxis: PPI Lines: N/A and Dialysis Catheter Foley:  Yes, and it is still needed Code Status:  DNR Last date of multidisciplinary goals of care discussion: 11/1 [place] meeting with PCCM and PCM -- plan for comfort care, residential hospice placement when bed available    CCT: n/a   Eliseo Gum MSN, AGACNP-BC [place] for pager 07/25/2021, 10:43 AM

## 2021-07-25 NOTE — Progress Notes (Addendum)
CSW received consult for residential hospice for patient. Family request is for [place]. CSW made referral to [facility] who confirmed she will follow patient and will speak with family shortly. CSW will continue to follow and assist with patients dc planning needs.

## 2021-07-26 DIAGNOSIS — N179 Acute kidney failure, unspecified: Secondary | ICD-10-CM | POA: Diagnosis not present

## 2021-07-26 DIAGNOSIS — G309 Alzheimer's disease, unspecified: Secondary | ICD-10-CM

## 2021-07-26 DIAGNOSIS — N1831 Chronic kidney disease, stage 3a: Secondary | ICD-10-CM | POA: Diagnosis not present

## 2021-07-26 DIAGNOSIS — Z515 Encounter for palliative care: Secondary | ICD-10-CM | POA: Diagnosis not present

## 2021-07-26 LAB — FUNGAL ORGANISM REFLEX

## 2021-07-26 LAB — FUNGUS CULTURE RESULT

## 2021-07-26 LAB — CULTURE, BLOOD (ROUTINE X 2)
Culture: NO GROWTH
Culture: NO GROWTH
Special Requests: ADEQUATE
Special Requests: ADEQUATE

## 2021-07-26 LAB — FUNGUS CULTURE WITH STAIN

## 2021-07-26 MED ORDER — MORPHINE SULFATE 10 MG/5ML PO SOLN: 5.0000 mg | ORAL | Status: DC | PRN

## 2021-07-26 NOTE — Plan of Care (Signed)

## 2021-07-26 NOTE — Plan of Care (Signed)

## 2021-07-26 NOTE — Consult Note (Signed)
   [facility] Inpatient Consult   07/26/2021  Beth [name] 1940-11-09 [id number]  [facility] [ACO] Patient: Medicare CMS DCE   Patient screened for hospitalization with noted extreme high risk score for unplanned readmission risk with 4 ED visits in the past 6 months.   Review of patient's medical record reveals patient is being consulted with palliative care.   Plan:  Continue to follow progress and disposition to assess for post hospital care management needs.    For questions contact:   Natividad Brood, RN BSN [facility] Liaison  [phone] business mobile phone Toll free office [phone]  Fax number: [phone] [email or web address]

## 2021-07-26 NOTE — TOC Progression Note (Addendum)
Transition of Care [facility]) - Progression Note    Patient Details  Name: Beth [name] MRN: [id number] Date of Birth: 1941/08/19  Transition [facility]) CM/SW [place], [place] Phone Number: 07/26/2021, 11:00 AM  Clinical Narrative:     Update 2:52pm- CSW received call from [facility] with [place] that there is now a bed available for patient at [facility]. Cristela Blue confirmed consents have been signed from family. CSW informed MD.  CSW received call from [place] with [facility] who confirmed patient was approved for residential hospice. Maura confirmed no bed availability today. CSW informed MD. CSW will continue to follow and assist with patients dc planning needs.       Expected Discharge Plan and Services                                                 Social Determinants of Health (SDOH) Interventions    Readmission Risk Interventions No flowsheet data found.

## 2021-07-26 NOTE — TOC Transition Note (Addendum)
Transition of Care [facility]) - CM/SW Discharge Note   Patient Details  Name: Beth [name] MRN: [id number] Date of Birth: 1941/02/21  [facility]) CM/SW Contact:  Milas Gain, [place] Phone Number: 07/26/2021, 3:22 PM   Clinical Narrative:     Patient will DC to: [place]   Anticipated DC date: 07/26/2021  Family notified: Traci  Transport by: Corey Harold  ?  Per MD patient ready for DC to [place] . RN, patient, patient's family,Maura with [facility], and facility notified of DC. RN given number for report tele# [phone]. DC packet on chart. DNR signed by MD attached to patients DC packet. Ambulance transport requested for patient.  CSW signing off.   Final next level of care: [place] ([place]) Barriers to Discharge: No Barriers Identified   Patient Goals and CMS Choice   CMS Medicare.gov Compare Post Acute Care list provided to:: Patient Represenative (must comment) (Patients daughter) Choice offered to / list presented to : Adult Children (Patients daughter Tressia Miners)  Discharge Placement              Patient chooses bed at:  [facility] [place]) Patient to be transferred to facility by: [place] Name of family member notified: Traci Patient and family notified of of transfer: 07/26/21  Discharge Plan and Services                                     Social Determinants of Health (SDOH) Interventions     Readmission Risk Interventions No flowsheet data found.

## 2021-07-26 NOTE — Progress Notes (Signed)
Patient ID: Beth [name], female   DOB: 1941-02-27, 80 y.o.   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  On presentation, patient was found to have multiple metabolic derangements including severe AKI, lactic acidosis, hyponatremia, and leukocytosis.  She had further decline on 07/22/2021 requiring transfer to ICU for emergent CRRT.  Nephrology was consulted.  Subsequently, palliative care was consulted and patient/family has decided to proceed with residential hospice placement.  CRRT was stopped.  She was transferred back to [facility] service on 07/26/2021.  Assessment & Plan:   Acute kidney injury on chronic kidney disease stage IIIa Acute metabolic encephalopathy superimposed on likely Alzheimer's dementia Comfort measures only status Suspected sepsis: Present on admission Possible aspiration pneumonia Hypertension Transient hypotension Failure to thrive/poor oral intake Generalized weakness Diabetes mellitus type 2 with hypoglycemia History of cavitary lung lesion Hyperkalemia Anemia of chronic disease Hyponatremia GERD Leukocytosis Thrombocytopenia  Plan -As per above discussion, patient/family has decided to proceed with residential hospice placement.  Social worker following.  Overall prognosis is poor.    DVT prophylaxis: None per comfort measures Code Status: DNR Family Communication: Husband at  bedside Disposition Plan: Status is: Inpatient  Remains inpatient appropriate because: Of need for his nasal hospice placement  Consultants: Nephrology/PCCM/palliative care  Procedures: CRRT which has been discontinued; dialysis catheter placement  Antimicrobials:  Anti-infectives (From admission, onward)    Start     Dose/Rate Route Frequency Ordered Stop   07/23/21 1430  Ampicillin-Sulbactam (UNASYN) 3 g in sodium chloride 0.9 % 100 mL IVPB  Status:  Discontinued        3 g 200 mL/hr over 30 Minutes Intravenous Every 8 hours 07/23/21 1417 07/25/21 1043   07/21/21 2000  ampicillin-sulbactam (UNASYN) 1.5 g in sodium chloride 0.9 % 100 mL IVPB  Status:  Discontinued        1.5 g 200 mL/hr over 30 Minutes Intravenous Every 24 hours 07/21/21 1647 07/23/21 1417   07/21/21 1500  cefTRIAXone (ROCEPHIN) 1 g in sodium chloride 0.9 % 100 mL IVPB  Status:  Discontinued        1 g 200 mL/hr over 30 Minutes Intravenous  Once 07/21/21 1453 07/21/21 1646        Subjective: Patient seen and examined at the bedside.  Denies worsening shortness of breath, chest pain, nausea or vomiting.  Objective: Vitals:   07/25/21 1800 07/25/21 1900 07/25/21 2000 07/25/21 2016  BP:   (!) 180/63   Pulse:      Resp: 20 (!) 26 16   Temp:    97.9 F (36.6 C)  TempSrc:    Oral  SpO2: 96% 98% 97%   Weight:      Height:        Intake/Output Summary (Last 24 hours) at 07/26/2021 1330 Last data filed at 07/26/2021 1100 Gross per 24 hour  Intake 45.4 ml  Output 1900 ml  Net -1854.6 ml   Filed Weights   07/21/21 1258 07/25/21 0600  Weight: 67.2 kg 69.5 kg    Examination:  General exam: Appears calm and comfortable.  Currently on room air. Respiratory system: Bilateral decreased breath sounds at bases with scattered crackles Cardiovascular system: S1 & S2 heard, Rate controlled   Data Reviewed: I have personally reviewed following labs and imaging studies  CBC: Recent Labs  Lab 07/21/21 1330  07/21/21 2347 07/23/21 0408 07/23/21 1533 07/24/21 0410  WBC 13.1* 13.4* 17.4* 17.1* 17.2*  NEUTROABS 10.1*  --  12.9*  --  13.3*  HGB 10.6* 10.2* 7.9* 8.1* 8.0*  HCT 32.0* 30.5* 22.8* 24.2* 23.8*  MCV 92.2 91.9 89.1 91.3 93.7  PLT 287 287 126* 121* 009*   Basic Metabolic Panel: Recent Labs  Lab 07/22/21 1600 07/22/21 2152 07/22/21 2154 07/23/21 0401 07/23/21 1005 07/23/21 1203 07/23/21 1538 07/24/21 0410 07/24/21 0412  NA 133*  --  133* 134*  134*  --   --  137 133* 136  K 4.9   < > 4.1 3.4*  3.3* 4.0 4.1 4.2 4.6 4.7  CL 96*  --  93* 93*  92*  --   --  101 101 103  CO2 14*  --  22 33*  33*  --   --  29 25 26   GLUCOSE 314*  --  90 48*  47*  --   --  92 123* 120*  BUN 51*  --  40* 26*  27*  --   --  14 9 8   CREATININE 5.28*  --  4.28* 2.85*  2.87*  --   --  2.20* 1.81* 1.69*  CALCIUM 7.0*  --  7.2* 6.8*  6.8*  --   --  7.4* 7.7* 7.8*  MG  --   --  1.8 1.8  --   --   --  2.2  --   PHOS 5.9*  --   --  3.0  --   --  2.1*  --  3.4   < > = values in this interval not displayed.   GFR: Estimated Creatinine Clearance: 24.7 mL/min (A) (by C-G formula based on SCr of 1.69 mg/dL (H)). Liver Function Tests: Recent Labs  Lab 07/21/21 1330 07/21/21 2347 07/22/21 1600 07/23/21 0401 07/23/21 1538 07/24/21 0410 07/24/21 0412  AST 27 25  --   --   --  40  --   ALT 19 18  --   --   --  20  --   ALKPHOS 88 85  --   --   --  70  --   BILITOT 0.8 0.6  --   --   --  0.5  --   PROT 6.5 5.9*  --   --   --  5.5*  --   ALBUMIN 3.3* 3.0* 2.4* 2.5* 2.7* 3.0* 3.0*   Recent Labs  Lab 07/21/21 1330  LIPASE 25   Recent Labs  Lab 07/21/21 1330  AMMONIA 12   Coagulation Profile: Recent Labs  Lab 07/21/21 1330  INR 1.0   Cardiac Enzymes: Recent Labs  Lab 07/21/21 1857  CKTOTAL 82   BNP (last 3 results) No results for input(s): PROBNP in the last 8760 hours. HbA1C: No results for input(s): HGBA1C in the last 72 hours. CBG: Recent Labs  Lab 07/24/21 2041  07/25/21 0006 07/25/21 0440 07/25/21 0824 07/25/21 1201  GLUCAP 138* 141* 126* 115* 137*   Lipid Profile: No results for input(s): CHOL, HDL, LDLCALC, TRIG, CHOLHDL, LDLDIRECT in the last 72 hours. Thyroid Function Tests: No results for input(s): TSH, T4TOTAL, FREET4, T3FREE, THYROIDAB  in the last 72 hours. Anemia Panel: No results for input(s): VITAMINB12, FOLATE, FERRITIN, TIBC, IRON, RETICCTPCT in the last 72 hours. Sepsis Labs: Recent Labs  Lab 07/22/21 0534 07/22/21 1117 07/22/21 1339 07/23/21 0401 07/23/21 1005 07/24/21 0410  PROCALCITON  --  0.38  --  0.61  --  0.46  LATICACIDVEN >9.0* >9.0* >9.0*  --  1.7  --     Recent Results (from the past 240 hour(s))  Resp Panel by RT-PCR (Flu A&B, Covid) Nasopharyngeal Swab     Status: None   Collection Time: 07/21/21  2:02 PM   Specimen: Nasopharyngeal Swab; Nasopharyngeal(NP) swabs in vial transport medium  Result Value Ref Range Status   SARS Coronavirus 2 by RT PCR NEGATIVE NEGATIVE Final    Comment: (NOTE) SARS-CoV-2 target nucleic acids are NOT DETECTED.  The SARS-CoV-2 RNA is generally detectable in upper respiratory specimens during the acute phase of infection. The lowest concentration of SARS-CoV-2 viral copies this assay can detect is 138 copies/mL. A negative result does not preclude SARS-Cov-2 infection and should not be used as the sole basis for treatment or other patient management decisions. A negative result may occur with  improper specimen collection/handling, submission of specimen other than nasopharyngeal swab, presence of viral mutation(s) within the areas targeted by this assay, and inadequate number of viral copies(<138 copies/mL). A negative result must be combined with clinical observations, patient history, and epidemiological information. The expected result is Negative.  Fact Sheet for Patients:  EntrepreneurPulse.com.au  Fact Sheet for Healthcare Providers:   IncredibleEmployment.be  This test is no t yet approved or cleared by the [place] FDA and  has been authorized for detection and/or diagnosis of SARS-CoV-2 by FDA under an Emergency Use Authorization (EUA). This EUA will remain  in effect (meaning this test can be used) for the duration of the COVID-19 declaration under Section 564(b)(1) of the Act, 21 U.S.C.section 360bbb-3(b)(1), unless the authorization is terminated  or revoked sooner.       Influenza A by PCR NEGATIVE NEGATIVE Final   Influenza B by PCR NEGATIVE NEGATIVE Final    Comment: (NOTE) The Xpert Xpress SARS-CoV-2/FLU/RSV plus assay is intended as an aid in the diagnosis of influenza from Nasopharyngeal swab specimens and should not be used as a sole basis for treatment. Nasal washings and aspirates are unacceptable for Xpert Xpress SARS-CoV-2/FLU/RSV testing.  Fact Sheet for Patients: EntrepreneurPulse.com.au  Fact Sheet for Healthcare Providers: IncredibleEmployment.be  This test is not yet approved or cleared by the [place] FDA and has been authorized for detection and/or diagnosis of SARS-CoV-2 by FDA under an Emergency Use Authorization (EUA). This EUA will remain in effect (meaning this test can be used) for the duration of the COVID-19 declaration under Section 564(b)(1) of the Act, 21 U.S.C. section 360bbb-3(b)(1), unless the authorization is terminated or revoked.  Performed at [facility], [place] [address], [place], [place] [address]   Blood culture (routine x 2)     Status: None   Collection Time: 07/21/21  4:20 PM   Specimen: BLOOD  Result Value Ref Range Status   Specimen Description BLOOD SITE NOT SPECIFIED  Final   Special Requests   Final    BOTTLES DRAWN AEROBIC AND ANAEROBIC Blood Culture adequate volume   Culture   Final    NO GROWTH 5 DAYS Performed at [facility], [address], [place], [place] [address]     Report Status 07/26/2021 FINAL  Final  Blood culture (  routine x 2)     Status: None   Collection Time: 07/21/21  6:57 PM   Specimen: BLOOD  Result Value Ref Range Status   Specimen Description BLOOD SITE NOT SPECIFIED  Final   Special Requests   Final    BOTTLES DRAWN AEROBIC ONLY Blood Culture adequate volume   Culture   Final    NO GROWTH 5 DAYS Performed at [facility], [address], [place], [place] [address]    Report Status 07/26/2021 FINAL  Final  MRSA Next Gen by PCR, Nasal     Status: None   Collection Time: 07/22/21 11:18 AM   Specimen: Nasal Mucosa; Nasal Swab  Result Value Ref Range Status   MRSA by PCR Next Gen NOT DETECTED NOT DETECTED Final    Comment: (NOTE) The GeneXpert MRSA Assay (FDA approved for NASAL specimens only), is one component of a comprehensive MRSA colonization surveillance program. It is not intended to diagnose MRSA infection nor to guide or monitor treatment for MRSA infections. Test performance is not FDA approved in patients less than 60 years old. Performed at [facility], [place] [address] [place], [place] [address]   Urine Culture     Status: None   Collection Time: 07/23/21 10:11 AM   Specimen: Urine, Clean Catch  Result Value Ref Range Status   Specimen Description URINE, CLEAN CATCH  Final   Special Requests NONE  Final   Culture   Final    NO GROWTH Performed at [facility], [place] [address], [place], [place] [address]    Report Status 07/24/2021 FINAL  Final         Radiology Studies: No results found.      Scheduled Meds:  amLODipine  5 mg Oral Daily   Chlorhexidine Gluconate Cloth  6 each Topical Daily   famotidine  20 mg Oral QHS   sodium chloride flush  3 mL Intravenous Q12H   Continuous Infusions:  sodium chloride Stopped (07/25/21 1732)   sodium chloride            Aline August, MD [facility] 07/26/2021, 1:30 PM

## 2021-07-26 NOTE — Discharge Summary (Signed)
Physician Discharge Summary  Patient ID: GEORGIE EDUARDO MRN: [id number] DOB/AGE: 80-Sep-1942 80 y.o.  Admit date: 07/21/2021 Discharge date: 07/26/2021  Problem List Principal Problem:   Acute renal failure superimposed on chronic kidney disease ([facility]) Active Problems:   Type 2 diabetes mellitus with hypoglycemia without coma (HCC)   SIRS (systemic inflammatory response syndrome) (HCC)   Hyperkalemia   Thrush   Acute metabolic encephalopathy   Lactic acidosis   History of UTI   Falls   Normocytic anemia DNR status Alzheimer's dementia  Encounter for palliative care Thrombocytopenia  FTT  Hypoglycemia  Aspiration   HPI:  80 yo F PMH colon cancer, sarcoidosis, active cavitary lung lesion, DM2, HTN, HLD, CKD IIIb, cognitive decline with imaging consistent with Alzheimer's dementia, recurrent UTIs who was admitted 07/21/21 for altered mental status. She was acutely found to have an AKI on CKD, hyperkalemia, concern for aspiration PNA in addition to chronic health concerns. Admitted to [facility] and nephrology consulted.  Hospital Course:  80 yo F PMH colon cancer, sarcoidosis, active cavitary lung lesion, DM2, HTN, HLD, CKD IIIb, cognitive decline with imaging consistent with Alzheimer's dementia, recurrent UTIs who was admitted 07/21/21 for altered mental status. She was acutely found to have an AKI on CKD, hyperkalemia, concern for aspiration PNA in addition to chronic health concerns. On 10/29 she was transferred to the ICU due to worsening renal failure and was started on CRRT. Without significant improvement in mental status despite medical optimization, the family elected to stop CRRT with hope to transition to full comfort care 11/1, which would be consistent with the patient's previously expressed wishes. 11/1 family, PCCM and PCM met and care efforts were shifted toward comfort care with plan for residential hospice. 11/2 bed in residential hospice is available and patient will be  discharged for compassionate end of life care.    Labs at discharge Lab Results  Component Value Date   CREATININE 1.69 (H) 07/24/2021   BUN 8 07/24/2021   NA 136 07/24/2021   K 4.7 07/24/2021   CL 103 07/24/2021   CO2 26 07/24/2021   Lab Results  Component Value Date   WBC 17.2 (H) 07/24/2021   HGB 8.0 (L) 07/24/2021   HCT 23.8 (L) 07/24/2021   MCV 93.7 07/24/2021   PLT 123 (L) 07/24/2021   Lab Results  Component Value Date   ALT 20 07/24/2021   AST 40 07/24/2021   ALKPHOS 70 07/24/2021   BILITOT 0.5 07/24/2021   Lab Results  Component Value Date   INR 1.0 07/21/2021   INR 0.97 05/02/2018   Temp:  [97.9 F (36.6 C)] 97.9 F (36.6 C) (11/01 2016) Resp:  [14-26] 16 (11/01 2000) BP: (180)/(63) 180/63 (11/01 2000) SpO2:  [96 %-98 %] 97 % (11/01 2000)  Exam:  Elderly female, reclined in bed NAD Awake and alert. Intermittently oriented  Regular rate and rhythm Symmetrical chest expansion, unlabored  Soft abdomen  Foley Calm pleasant mood and affect   Current radiology studies No results found.  Disposition: Transfer to residential hospice      Allergies as of 07/26/2021   No Known Allergies      Medication List     STOP taking these medications    acetaminophen 325 MG tablet Commonly known as: TYLENOL   amLODipine 5 MG tablet Commonly known as: NORVASC   AZO-CRANBERRY PO   D-MANNOSE PO   Droplet Pen Needles 32G X 5 MM Misc Generic drug: Insulin Pen Needle   famotidine 20  MG tablet Commonly known as: PEPCID   glipiZIDE 10 MG 24 hr tablet Commonly known as: GLUCOTROL XL   Marshmallow Artificial Flavor Conc   metFORMIN 1000 MG tablet Commonly known as: GLUCOPHAGE   multivitamin tablet   onetouch ultrasoft lancets   OneTouch Verio test strip Generic drug: glucose blood   OneTouch Verio w/Device Kit   ramipril 10 MG capsule Commonly known as: ALTACE   Tresiba FlexTouch 100 UNIT/ML FlexTouch Pen Generic drug: insulin  degludec          Discharged Condition: stable  Time spent on discharge: >30 minutes   Vital signs at Discharge.     Eliseo Gum MSN, AGACNP-BC [facility] 07/26/2021, 3:14 PM

## 2021-07-27 LAB — FUNGUS CULTURE WITH STAIN

## 2021-07-27 LAB — FUNGAL ORGANISM REFLEX

## 2021-07-27 LAB — FUNGUS CULTURE RESULT

## 2021-08-07 ENCOUNTER — Ambulatory Visit: Payer: Medicare Other | Admitting: Internal Medicine

## 2021-08-09 LAB — ACID FAST CULTURE WITH REFLEXED SENSITIVITIES (MYCOBACTERIA)
Acid Fast Culture: NEGATIVE
Acid Fast Culture: NEGATIVE
Acid Fast Culture: NEGATIVE
Acid Fast Culture: NEGATIVE

## 2021-08-23 ENCOUNTER — Telehealth: Payer: Self-pay | Admitting: Internal Medicine

## 2021-08-23 NOTE — Telephone Encounter (Signed)
Patient's daughter Tressia Miners to let Dr. Cruzita Lederer know that Patient passed away on 08-29-21.

## 2021-08-24 DEATH — deceased

## 2021-08-28 ENCOUNTER — Telehealth: Payer: Self-pay | Admitting: Pulmonary Disease

## 2021-08-30 NOTE — Telephone Encounter (Signed)
Spoke with Beth [name] (per DPR) who said pt had passed away. Beth [name] wanted Dr. Valeta Harms to be aware and also the front staff to be aware and notify her (Beth [name]) if there was an outstanding balance. Mel Almond could you please look into this?

## 2021-08-30 NOTE — Telephone Encounter (Signed)
Shows no balance on account- lvm for Traci.

## 2021-09-20 ENCOUNTER — Ambulatory Visit: Payer: Medicare Other | Admitting: Adult Health

## 2021-09-26 ENCOUNTER — Ambulatory Visit: Payer: Medicare Other | Admitting: Adult Health

## 2021-10-06 ENCOUNTER — Ambulatory Visit: Payer: Medicare Other | Admitting: Pulmonary Disease

## 2022-07-11 IMAGING — US US RENAL
1 series · 14 of 25 positions shown · non-contrast
Comparison: None.

CLINICAL DATA: Acute renal insufficiency

EXAM:
RENAL / URINARY TRACT ULTRASOUND COMPLETE

[Series 1: us renal · 32 acquisitions, 14 frames shown]
[im 1/32]
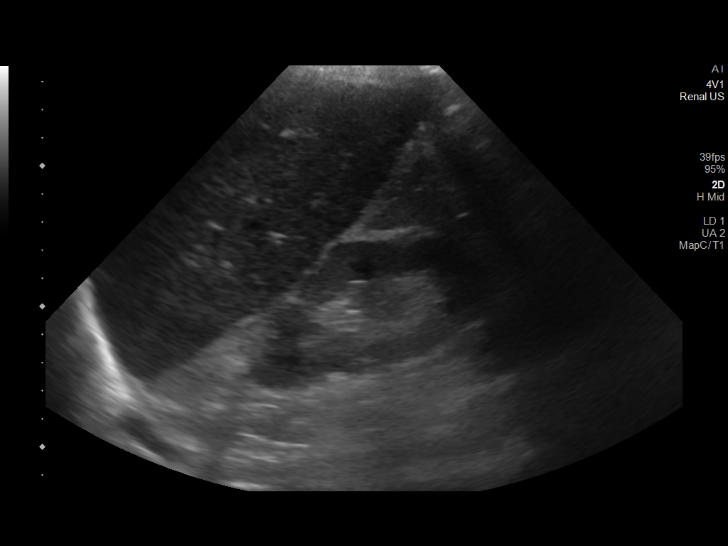
[im 3/32]
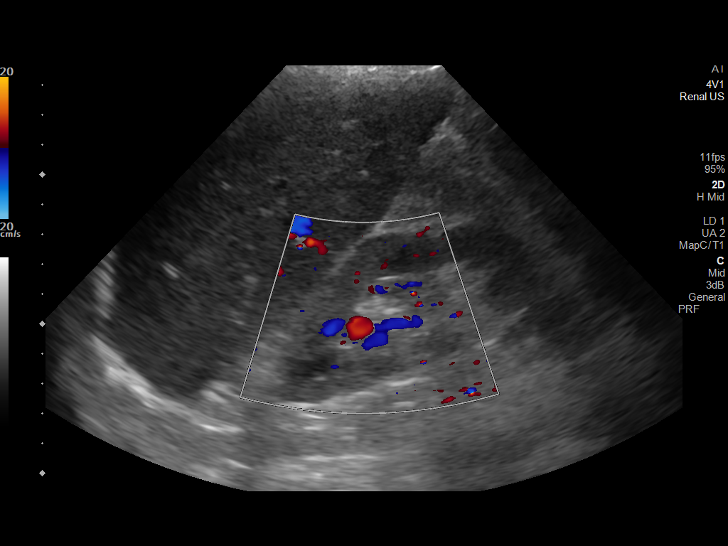
[im 6/32]
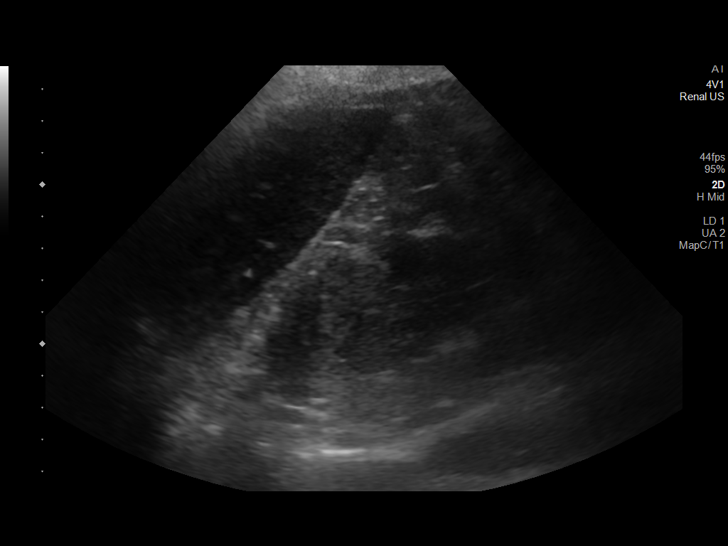
[im 8/32]
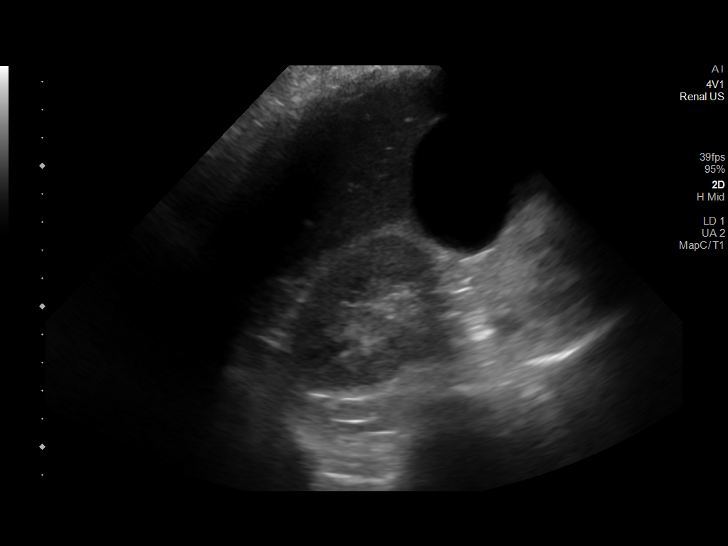
[im 11/32]
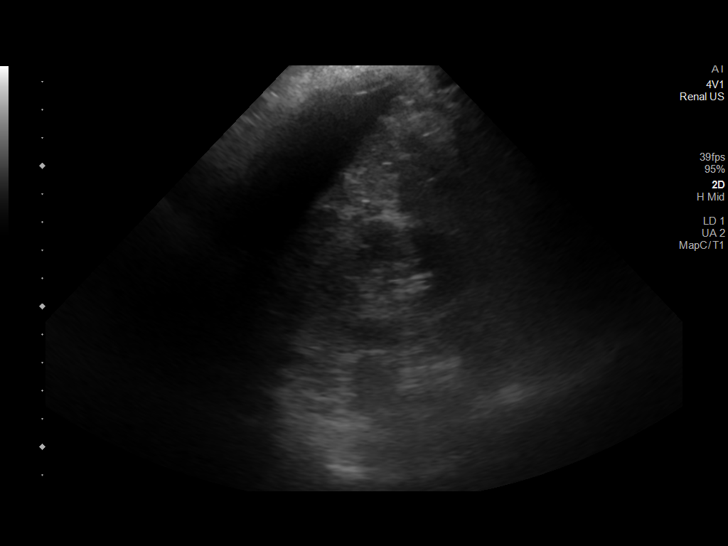
[im 12/32]
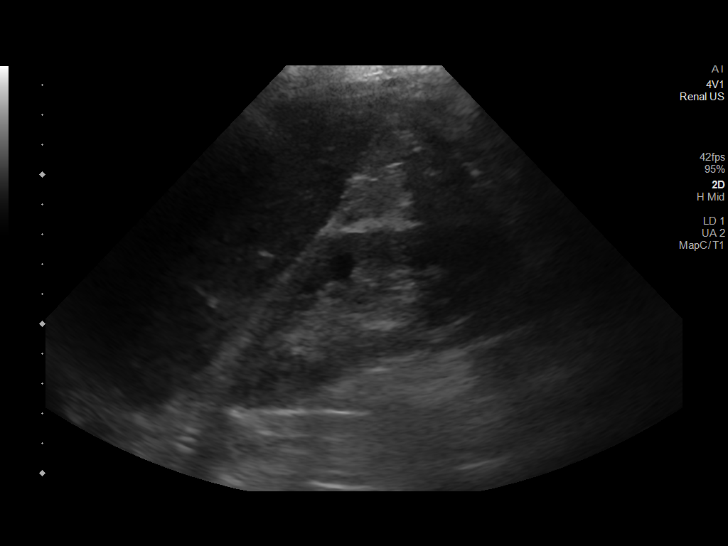
[im 15/32]
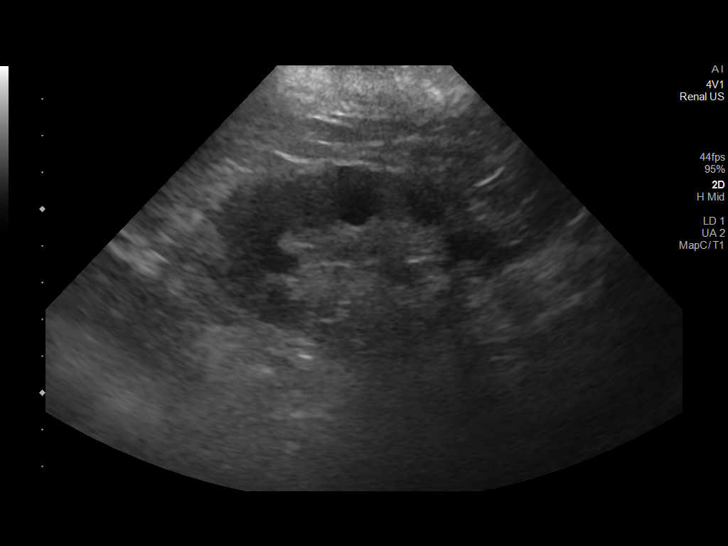
[im 17/32]
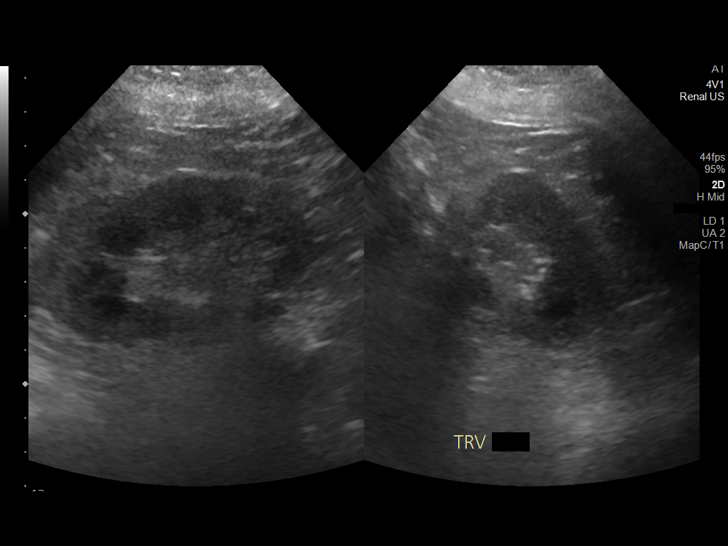
[im 20/32]
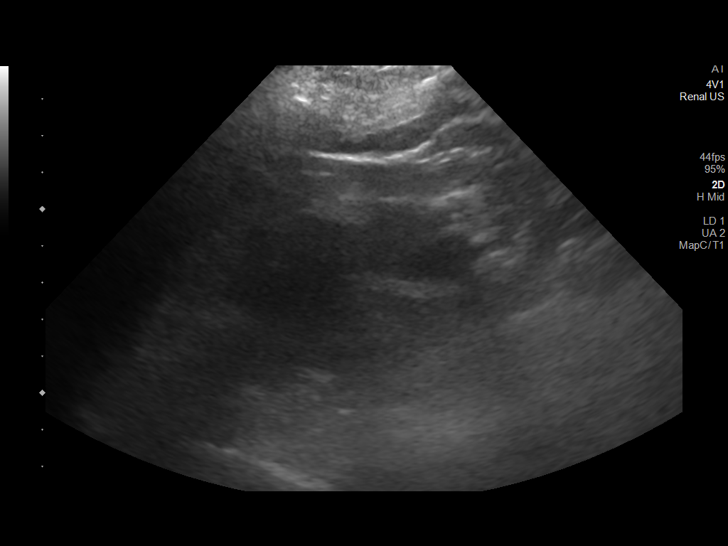
[im 21/32]
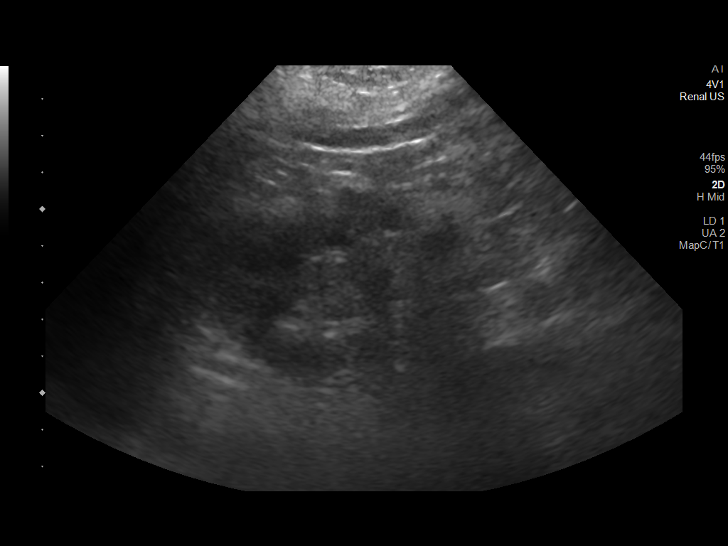
[im 24/32]
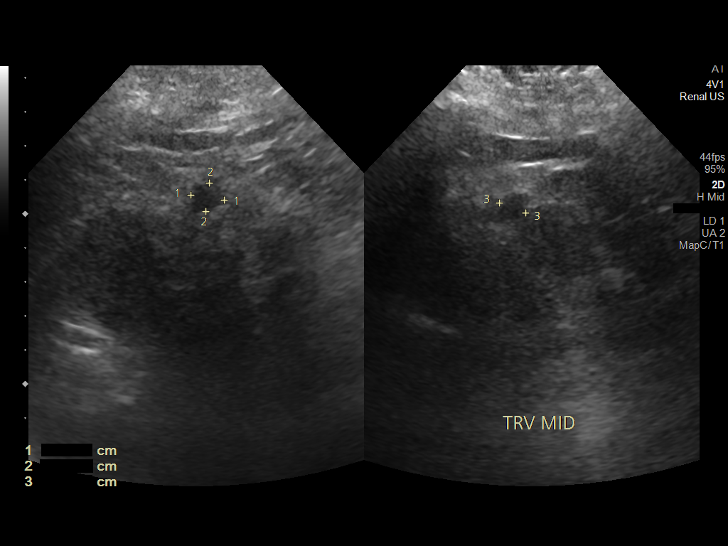
[im 26/32]
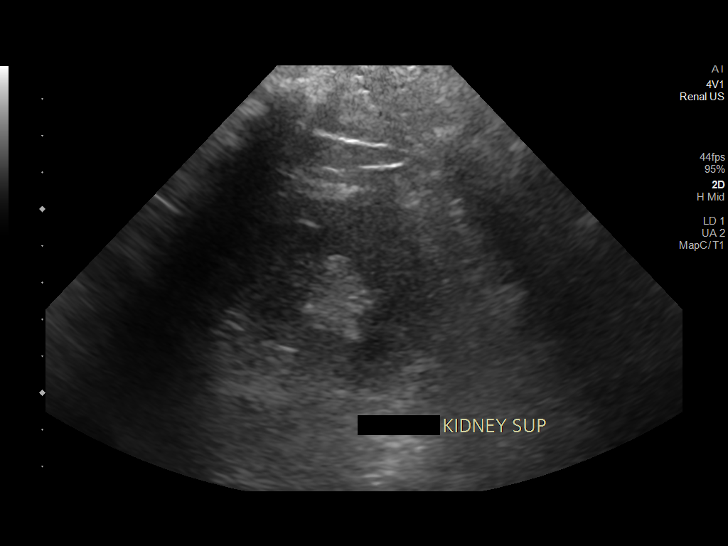
[im 29/32]
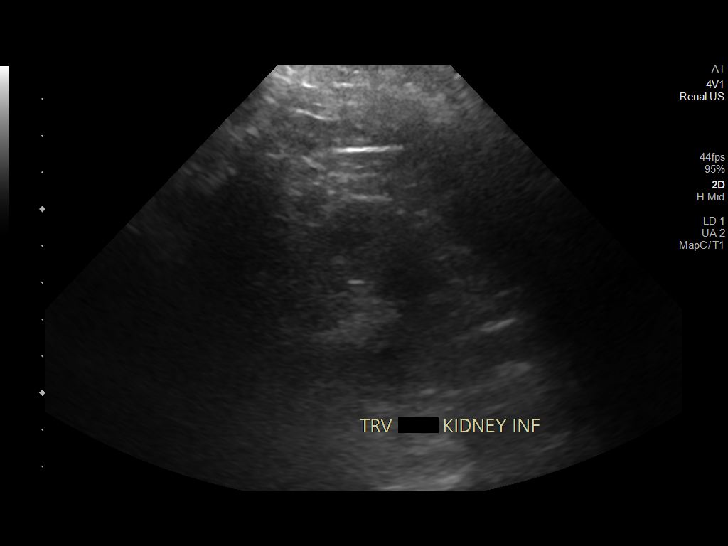
[im 32/32]
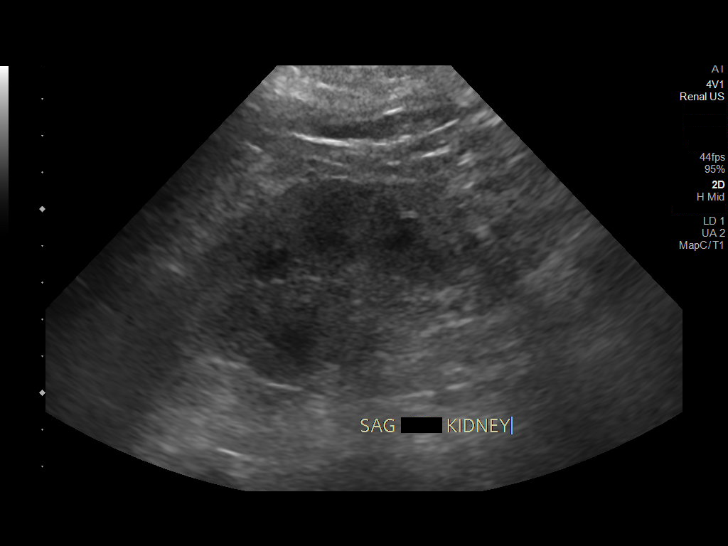

[14 of 25 positions shown; findings below may reference images not displayed]

FINDINGS: Right Kidney:

Renal measurements: 10.3 x 4.5 x 5.5 cm = volume: 132.1 mL.
Increased renal cortical echotexture. No hydronephrosis or renal
mass.

Left Kidney:

Renal measurements: 8.0 x 4.5 x 3.9 cm = volume: 73.2 mL. Increased
renal cortical echotexture. Simple appearing 1 cm cortical cyst. No
hydronephrosis.

Bladder:

Decompressed with a Foley catheter.

Other:

None.
IMPRESSION: 1. Bilateral increased renal cortical echotexture consistent with
medical renal disease.
2. Simple left renal cyst.

## 2022-07-11 IMAGING — DX DG CHEST 1V PORT
1 series · 1 of 1 positions shown · non-contrast
Comparison: 07/05/2021 chest radiograph.

CLINICAL DATA: Hypoglycemia, altered mental status

EXAM:
PORTABLE CHEST 1 VIEW

[chest ap]
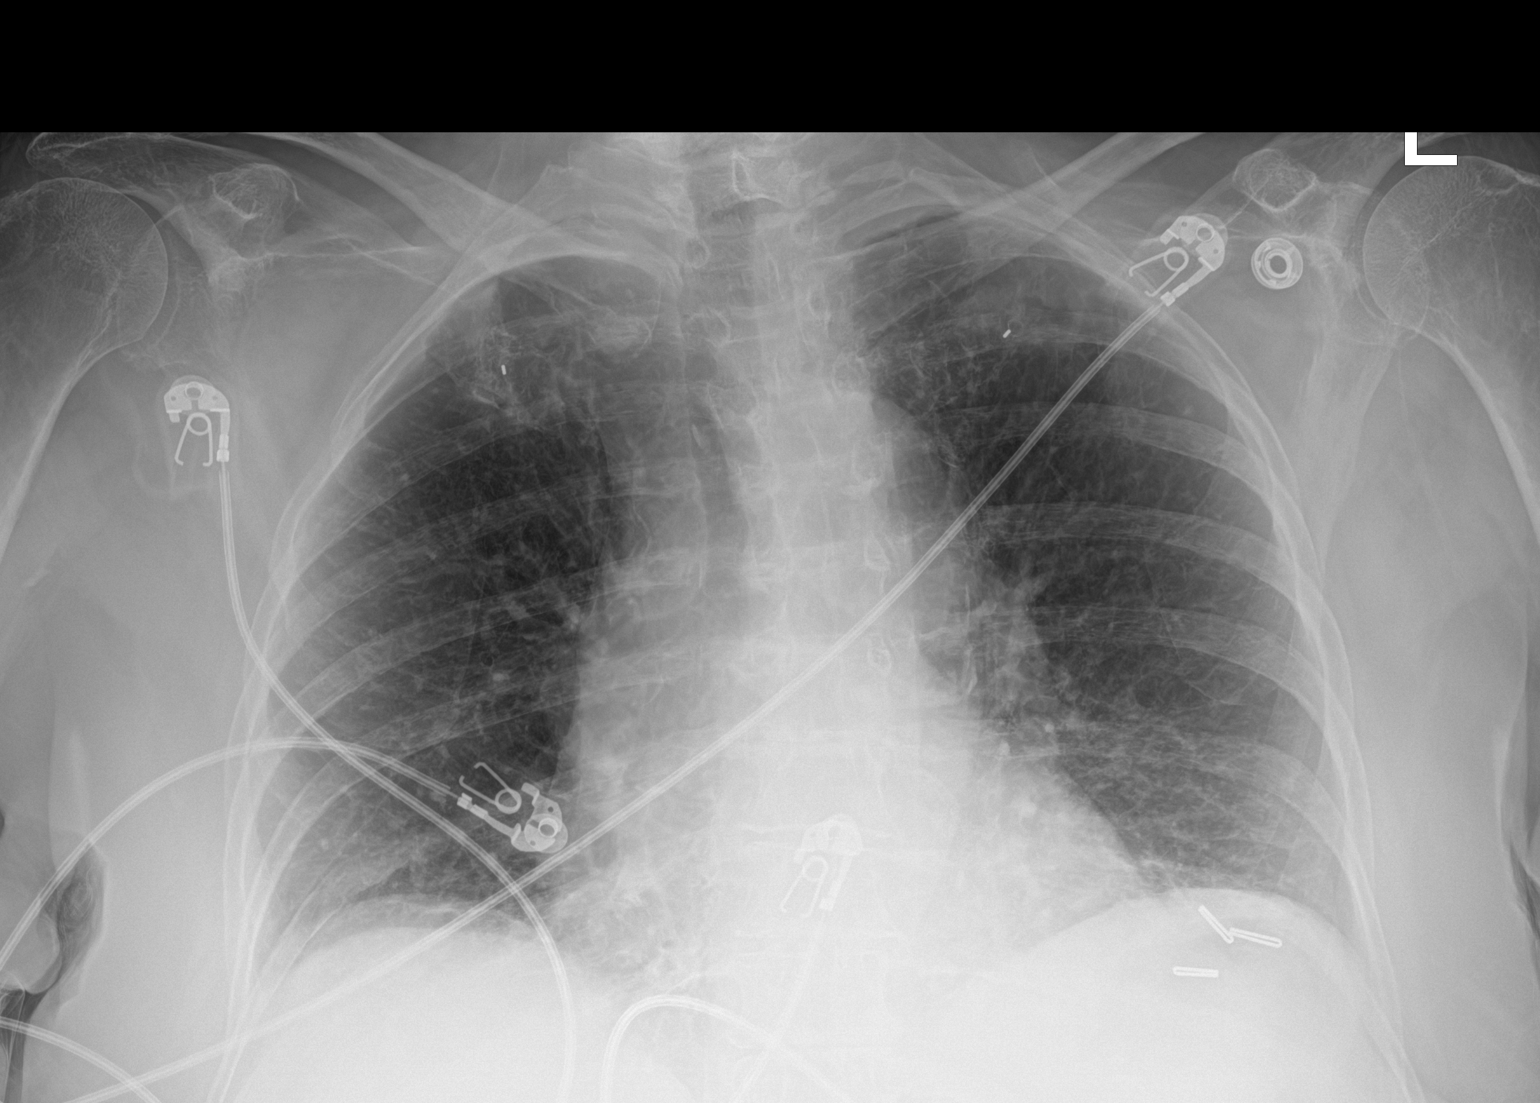

[1 of 1 positions shown; findings below may reference images not displayed]

FINDINGS: Surgical clips overlie the left upper quadrant of the abdomen.
Fiducial markers overlie the bilateral lung apices. Stable
cardiomediastinal silhouette with normal heart size. No
pneumothorax. No pleural effusion. Known chronic thick-walled
cavitary lesions at both lung apices are again noted. No pulmonary
edema. No acute consolidative airspace disease. Chronic mild
curvilinear bibasilar scarring.
IMPRESSION: 1. No acute cardiopulmonary disease.
2. Known chronic thick-walled cavitary lesions at both lung apices.
3. Chronic mild curvilinear bibasilar scarring.

## 2024-02-27 ENCOUNTER — Other Ambulatory Visit (HOSPITAL_COMMUNITY): Payer: Self-pay
